# Patient Record
Sex: Male | Born: 1945 | Race: White | Hispanic: No | Marital: Married | Smoking: Former smoker
Health system: Southern US, Community
[De-identification: ages and names within clinical notes are randomized; demographics above are authoritative.]

## PROBLEM LIST (undated history)

## (undated) DIAGNOSIS — I2699 Other pulmonary embolism without acute cor pulmonale: Secondary | ICD-10-CM

## (undated) DIAGNOSIS — C3491 Malignant neoplasm of unspecified part of right bronchus or lung: Secondary | ICD-10-CM

## (undated) DIAGNOSIS — C7951 Secondary malignant neoplasm of bone: Secondary | ICD-10-CM

## (undated) DIAGNOSIS — Z974 Presence of external hearing-aid: Secondary | ICD-10-CM

## (undated) DIAGNOSIS — J449 Chronic obstructive pulmonary disease, unspecified: Secondary | ICD-10-CM

## (undated) DIAGNOSIS — M1990 Unspecified osteoarthritis, unspecified site: Secondary | ICD-10-CM

## (undated) DIAGNOSIS — I82409 Acute embolism and thrombosis of unspecified deep veins of unspecified lower extremity: Secondary | ICD-10-CM

## (undated) DIAGNOSIS — H9191 Unspecified hearing loss, right ear: Secondary | ICD-10-CM

## (undated) DIAGNOSIS — Z87442 Personal history of urinary calculi: Secondary | ICD-10-CM

## (undated) DIAGNOSIS — C61 Malignant neoplasm of prostate: Secondary | ICD-10-CM

## (undated) DIAGNOSIS — H3530 Unspecified macular degeneration: Secondary | ICD-10-CM

## (undated) HISTORY — PX: SHOULDER SURGERY: SHX246

## (undated) HISTORY — DX: Secondary malignant neoplasm of bone: C79.51

## (undated) HISTORY — PX: OTHER SURGICAL HISTORY: SHX169

---

## 1991-02-13 HISTORY — PX: KNEE ARTHROSCOPY: SUR90

## 2000-07-01 ENCOUNTER — Emergency Department (HOSPITAL_COMMUNITY): Admission: EM | Admit: 2000-07-01 | Discharge: 2000-07-01 | Payer: Self-pay | Admitting: Emergency Medicine

## 2000-07-01 ENCOUNTER — Encounter: Payer: Self-pay | Admitting: Emergency Medicine

## 2000-07-05 ENCOUNTER — Emergency Department (HOSPITAL_COMMUNITY): Admission: EM | Admit: 2000-07-05 | Discharge: 2000-07-05 | Payer: Self-pay | Admitting: Emergency Medicine

## 2002-05-22 ENCOUNTER — Emergency Department (HOSPITAL_COMMUNITY): Admission: EM | Admit: 2002-05-22 | Discharge: 2002-05-23 | Payer: Self-pay | Admitting: *Deleted

## 2002-05-23 ENCOUNTER — Encounter: Payer: Self-pay | Admitting: *Deleted

## 2006-02-27 ENCOUNTER — Emergency Department (HOSPITAL_COMMUNITY): Admission: EM | Admit: 2006-02-27 | Discharge: 2006-02-27 | Payer: Self-pay | Admitting: Emergency Medicine

## 2006-09-18 ENCOUNTER — Encounter (INDEPENDENT_AMBULATORY_CARE_PROVIDER_SITE_OTHER): Payer: Self-pay | Admitting: General Surgery

## 2006-09-18 ENCOUNTER — Inpatient Hospital Stay (HOSPITAL_COMMUNITY): Admission: EM | Admit: 2006-09-18 | Discharge: 2006-09-23 | Payer: Self-pay | Admitting: Emergency Medicine

## 2006-10-29 ENCOUNTER — Ambulatory Visit (HOSPITAL_COMMUNITY): Admission: RE | Admit: 2006-10-29 | Discharge: 2006-10-29 | Payer: Self-pay | Admitting: Family Medicine

## 2007-10-17 ENCOUNTER — Encounter (INDEPENDENT_AMBULATORY_CARE_PROVIDER_SITE_OTHER): Payer: Self-pay | Admitting: Urology

## 2007-10-24 DIAGNOSIS — C61 Malignant neoplasm of prostate: Secondary | ICD-10-CM | POA: Insufficient documentation

## 2007-10-24 HISTORY — DX: Malignant neoplasm of prostate: C61

## 2007-10-29 ENCOUNTER — Encounter (HOSPITAL_COMMUNITY): Admission: RE | Admit: 2007-10-29 | Discharge: 2007-11-10 | Payer: Self-pay | Admitting: Urology

## 2007-11-21 ENCOUNTER — Ambulatory Visit (HOSPITAL_COMMUNITY): Admission: RE | Admit: 2007-11-21 | Discharge: 2007-11-21 | Payer: Self-pay | Admitting: Radiation Oncology

## 2007-12-18 ENCOUNTER — Ambulatory Visit: Admission: RE | Admit: 2007-12-18 | Discharge: 2008-02-10 | Payer: Self-pay | Admitting: Radiation Oncology

## 2008-02-13 HISTORY — PX: APPENDECTOMY: SHX54

## 2008-02-13 HISTORY — PX: CYSTOSCOPY: SUR368

## 2008-06-06 ENCOUNTER — Emergency Department (HOSPITAL_COMMUNITY): Admission: EM | Admit: 2008-06-06 | Discharge: 2008-06-06 | Payer: Self-pay | Admitting: Emergency Medicine

## 2008-07-26 ENCOUNTER — Ambulatory Visit (HOSPITAL_COMMUNITY): Admission: RE | Admit: 2008-07-26 | Discharge: 2008-07-26 | Payer: Self-pay | Admitting: Family Medicine

## 2008-09-03 ENCOUNTER — Ambulatory Visit (HOSPITAL_COMMUNITY): Admission: RE | Admit: 2008-09-03 | Discharge: 2008-09-03 | Payer: Self-pay | Admitting: Urology

## 2008-11-01 ENCOUNTER — Ambulatory Visit (HOSPITAL_COMMUNITY): Admission: RE | Admit: 2008-11-01 | Discharge: 2008-11-01 | Payer: Self-pay | Admitting: Urology

## 2010-05-19 LAB — CBC
MCHC: 34.3 g/dL (ref 30.0–36.0)
MCV: 93.1 fL (ref 78.0–100.0)
Platelets: 187 10*3/uL (ref 150–400)
WBC: 5.2 10*3/uL (ref 4.0–10.5)

## 2010-05-19 LAB — BASIC METABOLIC PANEL
BUN: 16 mg/dL (ref 6–23)
CO2: 27 mEq/L (ref 19–32)
Calcium: 9.3 mg/dL (ref 8.4–10.5)
Chloride: 107 mEq/L (ref 96–112)
Creatinine, Ser: 0.84 mg/dL (ref 0.4–1.5)

## 2010-05-24 LAB — POCT CARDIAC MARKERS
CKMB, poc: <1 ng/mL — ABNORMAL LOW (ref 1.0–8.0)
Troponin i, poc: <0.05 ng/mL (ref 0.00–0.09)
Troponin i, poc: <0.05 ng/mL (ref 0.00–0.09)

## 2010-05-24 LAB — DIFFERENTIAL
Basophils Absolute: 0 10*3/uL (ref 0.0–0.1)
Basophils Relative: 0 % (ref 0–1)
Eosinophils Absolute: 0.2 10*3/uL (ref 0.0–0.7)
Eosinophils Relative: 3 % (ref 0–5)
Lymphocytes Relative: 11 % — ABNORMAL LOW (ref 12–46)
Monocytes Absolute: 0.4 10*3/uL (ref 0.1–1.0)

## 2010-05-24 LAB — COMPREHENSIVE METABOLIC PANEL
ALT: 24 U/L (ref 0–53)
AST: 22 U/L (ref 0–37)
Albumin: 3.5 g/dL (ref 3.5–5.2)
Alkaline Phosphatase: 91 U/L (ref 39–117)
Chloride: 109 mEq/L (ref 96–112)
GFR calc Af Amer: >60 mL/min (ref >60)
Potassium: 4.2 mEq/L (ref 3.5–5.1)
Total Bilirubin: 0.5 mg/dL (ref 0.3–1.2)

## 2010-05-24 LAB — CBC
Platelets: 203 10*3/uL (ref 150–400)
RBC: 4.78 MIL/uL (ref 4.22–5.81)
WBC: 8 10*3/uL (ref 4.0–10.5)

## 2010-06-07 ENCOUNTER — Other Ambulatory Visit: Payer: Self-pay | Admitting: Gastroenterology

## 2010-06-27 NOTE — Group Therapy Note (Signed)
NAME:  BAINE, DECESARE                 ACCOUNT NO.:  [id number]   MEDICAL RECORD NO.:  [id number]          PATIENT TYPE:  INP   LOCATION:  A313                          FACILITY:  [facility]   PHYSICIAN:  Edward L. Juanetta Gosling, M.D.DATE OF BIRTH:  [birth year]   DATE OF PROCEDURE:  DATE OF DISCHARGE:                                 PROGRESS NOTE   Patient of Dr. Malvin Johns.  Mr. Knierim seems to be doing better.  He has no  new complaints except has got hiccups.  He has been up and moving.  He  has no new problems.   His temperature is 101.2, pulse 49, respirations 20, blood pressure  134/77, O2 sats 95%, 92% earlier on room air.  He is wearing 2L now.  His chest shows minimal rhonchi.  HEART:  Regular.  ABDOMEN:  Soft.   Sputum does not show any specifics as yet.   ASSESSMENT:  He has pneumonia but he seems to be improving.  He has had  an appendectomy.  He now has hiccups, and we are working on that.  He  now has hiccups and, Dr. Malvin Johns of course will address that.  I have  asked him to try to stop smoking now because I think, if he can it will  improve his overall situation.      Edward L. Juanetta Gosling, M.D.  Electronically Signed     ELH/MEDQ  D:  09/20/2006  T:  09/20/2006  Job:  [id number]

## 2010-06-27 NOTE — Consult Note (Signed)
NAME:  Jonathan Goodwin, Jonathan Goodwin                 ACCOUNT NO.:  [id number]   MEDICAL RECORD NO.:  [id number]          PATIENT TYPE:  INP   LOCATION:  A313                          FACILITY:  [facility]   PHYSICIAN:  Edward L. Juanetta Gosling, M.D.DATE OF BIRTH:  Apr 09, 1945   DATE OF CONSULTATION:  09/18/2006  DATE OF DISCHARGE:                                 CONSULTATION   Patient of Dr. Malvin Johns.   REASON FOR CONSULTATION:  Pneumonia.   HISTORY:  Jonathan Goodwin is a 65 year old who came to the emergency room with  abdominal discomfort.  He had been diagnosed with pneumonia about 2 days  prior.  When he was in the emergency room he was noted to have  appendicitis, and he has been treated by Dr. Malvin Johns with an  appendectomy.  He says his chest is feeling really well but he is having  some trouble with trying to cough.  He says that his chest feels back to  normal.  He does have a very long smoking history but he says has  stopped about 2 days ago.  He has history of kidney stones in the past.  He has a history of pneumonia in the past.   SOCIAL HISTORY:  He he does not drink any alcohol, does not use any  illicit drugs.  He does smoke, as mentioned.   He does not have any drug allergies.   Medications at home have been:  1. Doxycycline 100 mg b.i.d.  2. Ventolin inhaler two puffs four times a day as needed.  3. Hydrocodone and acetaminophen as needed.   PHYSICAL EXAMINATION:  A well-developed, well-nourished, comfortable-  appearing male who is in no acute distress.  His temperature has been as high as 100.1, now 100, pulse in the 40s to  50s, respirations 16, blood pressure 103//63 O2 saturations 93% on room  air.  He is able to do 2000 on his incentive spirometry.  His pupils are reactive.  Mucous membranes slightly dry.  His chest is actually fairly clear.  His heart is regular without gallop.  His extremities showed no edema.  I did not examine his abdomen since he has just had surgery.   His white  blood count was 22,000 on admission, is down to 15,500.  Renal  function normal.  Chest x-ray actually probably a bit more consistent  with atelectasis than pneumonia, but with the high white blood count he  certainly could have pneumonia although it also could, of course, be  related to his problems with his appendicitis.   My plan, then:  He is already on Cipro.  I would probably add a  cephalosporin, which I think he can probably take by mouth as soon as he  is able to take any food, and he needs to finish up about 10 days of  antibiotics.      Edward L. Juanetta Gosling, M.D.  Electronically Signed     ELH/MEDQ  D:  09/19/2006  T:  09/19/2006  Job:  [id number]

## 2010-06-27 NOTE — H&P (Signed)
NAME:  Jonathan Goodwin, Jonathan Goodwin NO.:  [id number]   MEDICAL RECORD NO.:  [id number]          PATIENT TYPE:  INP   LOCATION:  A313                          FACILITY:  [facility]   PHYSICIAN:  Barbaraann Barthel, M.D. DATE OF BIRTH:  Mar 07, 1945   DATE OF ADMISSION:  09/18/2006  DATE OF DISCHARGE:  LH                              HISTORY & PHYSICAL   Surgery was asked to see this 65 year old white male for appendicitis.  He was seen in the emergency room after coming with complaints of right  lower quadrant pain, nausea and vomiting.  CT scan revealed that he had  acute nonperforated appendicitis.  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  He did not, however, go to  see any physician until yesterday at which time chest x-rays were done  at the doctor's office and he was told that he had pneumonia.  He was  given tetracycline which he took but he threw up because he later  developed the GI symptoms of nausea and vomiting associated with acute  appendicitis, so he came to the emergency room with those findings.  We  obtained a chest x-ray and he does have some left lower lobe atelectasis  and left lower lobe pneumonia present.  The rest of physical examination  discloses a pleasant 65 year old male in no acute distress.  He is 6  feet 1 inch tall, weighs 194 pounds.  His blood pressure is 99/63, pulse  rate 56, respirations 20.  His temperature is 99.9.   PHYSICAL EXAMINATION:  HEAD:  Normocephalic.  EYES:  Extraocular movements are intact.  Pupils are equal and react to  light and accommodation.  He has a history of macular degeneration.  No bruits are auscultated.  No  cervical adenopathy is appreciated.  CHEST:  The patient has some scattered rhonchi, worse on the left side.  HEART:  Regular rhythm.  ABDOMEN:  Bowel sounds are somewhat diminished.  The patient has pain  with guarding in the right lower quadrant.  No hernias are appreciated.  RECTAL:  The prostate is smooth and the stool is guaiac-negative.  EXTREMITIES:  The patient has had a left shoulder surgery in the past  some 30 years ago and he had right knee arthroscopic surgery in 1996.   REVIEW OF SYSTEMS:  GU SYSTEM:  The patient has no dysuria.  He has had  a previous history of kidney stones.  CARDIORESPIRATORY SYSTEM:  The  patient has this history of current pneumonia.  He smokes one to two  packs of cigarettes per day.  No past history of myocardial infarction.  No cardiac-like chest pains.  EKG was a normal cardiogram with sinus  bradycardia.  ENDOCRINE SYSTEM:  No history of diabetes or  thyroid  disease.  GI SYSTEM:  The patient has no history of hepatitis.  He has a  history of ulcerative colitis.  His last flare up was approximately 8  years ago in 2000.  No recent history of diarrhea or bloody stools or  mucoid diarrhea.  As stated, guaiac of his stool in the emergency room  was negative.   LABORATORY DATA:  The patient's white count is 22.7 with an H&H of 16.5  and 48.4, 92% neutrophils are noted.  His electrolytes are grossly  within normal limits.  Liver function studies are not elevated.  His  lipase and amylase are within normal limits.  His urine is grossly  within normal limits.  His blood sugar is 139, his BUN is 14, his  creatinine is 1.29, and his potassium is 4.2.  His specific gravity is  1.020.  Since this time in the emergency room he has been well hydrated  and I suspect he was somewhat dehydrated when he came in here with his  symptoms of nausea and vomiting.   IMPRESSION:  The patient has acute appendicitis by CT scan and  clinically he also presents  concomitantly with left lower lobe  pneumonia.  We will treat him with antibiotics and I will get a medical  consult.  This patient will be taken to surgery for acute appendicitis  this evening.  His dehydration has been attended to here in the  emergency room and we will follow him closely.      Barbaraann Barthel, M.D.  Electronically Signed     WB/MEDQ  D:  09/18/2006  T:  09/19/2006  Job:  [id number]   cc:   Hospitalist Team

## 2010-06-27 NOTE — Op Note (Signed)
Jonathan Goodwin, BOSKET                 ACCOUNT NO.:  [id number]   MEDICAL RECORD NO.:  [id number]          PATIENT TYPE:  INP   LOCATION:  A313                          FACILITY:  [facility]   PHYSICIAN:  Barbaraann Barthel, M.D. DATE OF BIRTH:  1945-05-21   DATE OF PROCEDURE:  09/18/2006  DATE OF DISCHARGE:                               OPERATIVE REPORT   PREOPERATIVE DIAGNOSIS:  Acute appendicitis.   POSTOPERATIVE DIAGNOSIS:  Acute suppurative, perforated appendix.   PROCEDURE:  Open appendectomy.   NOTE:  This is a 65 year old white male who presented with, according to  him, a 24-hour history of right lower quadrant pain accompanied with  nausea and vomiting.  He also presented concomitantly with left lower  lobe pneumonia, which was diagnosed again 24 hours previously.  He came  to the emergency room.  A CT scan was performed and revealed acute  appendicitis.  On questioning him I was impressed with the history and  the physical examination for pneumonia as well, as well as the elevated  white count with a left shift, and we planned to take him to the  operating room.  We discussed the procedure with him in detail,  discussing complications not limited to but including bleeding,  infection and leaking from appendiceal stump, and we also discussed the  possibility that a drain may need to be placed if this was perforated or  an abscessed appendix.  Informed consent was obtained.   GROSS OPERATIVE FINDINGS:  Those consistent with a perforated, necrotic,  gangrenous appendix.  The rest of the right lower quadrant terminal  ileum was normal.   SPECIMEN:  Appendix.   TECHNIQUE:  The patient was placed in a supine position.  After the  adequate administration of general anesthesia via endotracheal  intubation, a Foley catheter was inserted.  The patient was prepped with  Betadine solution and draped in the usual manner.  A transverse incision  was carried out in the right lower quadrant  through skin and  subcutaneous tissue down through the rectus muscle sheath.  The rectus  muscle was partly divided and retracted medially.  The posterior sheath  and peritoneum was then grasped and opened carefully and the abdomen was  entered and explored with the above findings.  I ligated the  mesoappendix with 2-0 silk and there was obvious perforation here.  We  were able to maintain minimal contamination.  Then after ligating the  mesoappendix I amputated it with a TA-30 stapler and inverted the stump.  We then changed gloves, irrigated the right lower quadrant with copious  amounts of normal saline solution, and I elected to leave a Al Pimple drain through a separate stab wound incision in the right lower  quadrant.  Then after changing gloves and irrigating, we closed the  fascia with interrupted figure-of-eight 0 Vicryl sutures.  We used with  approximately 10 mL of 0.5% Sensorcaine to help with postoperative  comfort.  Then we irrigated the subcu and partially closed the wound and  packed it open and left sutures to close at a later date  in a delayed  fashion as this patient is a high risk for wound infection.  I used 4-0  nylon sutures, which were left in place to be tied later.  I packed the  wound open with iodoform gauze soaked in saline solution and then placed  a sterile dressing over this.  Prior to closure all sponge, needle and  instrument counts were  found to be correct.  Estimated blood loss was minimal.  The patient  received a liter of crystalloids intraoperatively.  There were no  complications.  Operating time was approximately an hour, and the  patient was taken to the recovery room in satisfactory condition.      Barbaraann Barthel, M.D.  Electronically Signed     WB/MEDQ  D:  09/18/2006  T:  09/19/2006  Job:  [id number]   cc:   Ramon Dredge L. Juanetta Gosling, M.D.  Fax: [phone]   Rhae Lerner. Margretta Ditty, M.D.  [address]  [place]  [place] [address]

## 2010-06-27 NOTE — Discharge Summary (Signed)
Jonathan Goodwin, Jonathan Goodwin                 ACCOUNT NO.:  [id number]   MEDICAL RECORD NO.:  [id number]          PATIENT TYPE:  INP   LOCATION:  A313                          FACILITY:  [facility]   PHYSICIAN:  Barbaraann Barthel, M.D. DATE OF BIRTH:  [birth year]   DATE OF ADMISSION:  09/18/2006  DATE OF DISCHARGE:  LH                               DISCHARGE SUMMARY   DIAGNOSES:  1. Acute appendicitis (with perforation).  2. Left lower lobe pneumonia.  3. Bradycardia.   CONSULTATIONS:  Dr. Juanetta Gosling of the respiratory service and Dr. Domingo Sep  of [facility].   HISTORY OF PRESENT ILLNESS:  This is a 65 year old white male who came  to the emergency room and had acute appendicitis.  Intraoperatively he  was found to have acute perforated appendicitis.  He also presented with  concomitant pneumonia. Postoperatively he did very well.  He was seen by  Dr. Juanetta Gosling regarding his pneumonia and we treated him with Cipro as  well as Rocephin while in the hospital setting.  He defervesced and his  white count went from 20,000 down to 12,000 and he clinically improved.  His wound was packed open for a delayed closure to avoid an infection in  this patient with a perforated appendix and we removed the packing on  the third postoperative day and irrigated it and closed the wound by the  bedside. On the fourth postoperative day his wounds looked good.  He  remained afebrile and his wound was clean without any signs of  infection.  He was moving his bowels and urinating and doing well and  tolerating p.o. without problem.  He was noted to have bradycardia in  the in the 40s and the 50s while he was here and I wanted him to see Dr.  Margo Aye who is his regular doctor; however, Dr. Margo Aye was out of town. His  wife sees [place] cardiology and I  therefore consulted them to see  him prior to his discharge and their recommendations are pending but  will be noted prior to his discharge.   As stated his  hospital course was completely uneventful.  He did well.  Pathology did reveal acute supportive perforated appendicitis.  His  white count on September 21, 2006 was 12.1 with an H&H of 14.4 and 42.9.  This is down from a 22,000 white count at admission. His electrolytes  were grossly within normal limits. His liver function studies were also  grossly within normal limits. His sputum cultures showed rare white  blood cells, a few squamous cell, epithelial cells a few gram positive  cocci were noted. His urinalysis showed 15,000 colonies not significant  enterococcus species.  His chest x-ray did show improved basilar  aeration on follow-up chest x-rays from his initial findings of  pneumonia.  CT scan did reveal appendicitis and their impression was  that there was not perforation; however, intraoperatively there indeed  was a walled off perforation of the appendix.   DISCHARGE INSTRUCTIONS:  He is excused from work and given a note to  that effect for his employer. He is  discharged on a soft diet and a full  liquid diet.  He is told to clean his wound with alcohol three times  daily or more so if he becomes sweaty etc. He is told to increase his  activity as tolerated.  He is permitted to go indoors and outdoors and  up and down the stairs.  He is permitted to shower in 2 days.  He is to  keep his wound clean with alcohol. He is to avoid swimming pools,  Jacuzzis and baths until I tell him to do so. No heavy lifting, no  driving, no sexual activity at that time until instructed.  We will  follow up with him in the office on Thursday, September 26, 2006 at 2  o'clock p.m. He is told to come to the emergency room or contact me  should he have any acute changes.  He is given a prescription for Cipro  500 mg to take every 12 hours with the next dose being at 10 o'clock  this evening and to continue these for the next 5 days.  He is to take  Colace 1 tablet daily or every other day as needed and Milk  of Magnesia  also as needed.  He is told to stay away from any harsh laxatives  however.  Darvocet-N 100 one tablet every 4 hours is another  prescription that is given to him should he need that. As far as his  cardiac meds go, these will be prescribed by Dr. Domingo Sep prior to his  departure and discharge.      Barbaraann Barthel, M.D.  Electronically Signed     WB/MEDQ  D:  09/23/2006  T:  09/24/2006  Job:  [id number]   cc:   Catalina Pizza, M.D.  Fax: [phone]   Dani Gobble, MD  Fax: [phone]   Oneal Deputy. Juanetta Gosling, M.D.  Fax: [phone]

## 2010-06-27 NOTE — Group Therapy Note (Signed)
NAME:  LOGIN, MUCKLEROY                 ACCOUNT NO.:  [id number]   MEDICAL RECORD NO.:  [id number]          PATIENT TYPE:  INP   LOCATION:  A313                          FACILITY:  [facility]   PHYSICIAN:  Edward L. Juanetta Gosling, M.D.DATE OF BIRTH:  28-Jul-1945   DATE OF PROCEDURE:  DATE OF DISCHARGE:                                 PROGRESS NOTE   Patient of Dr. Malvin Johns.  Mr. Mannan continues to recover from his  appendectomy.  He has still not had any bowel movement, but he feels  pretty well.  He is not very short of breath.  He is coughing.  He is  coughing up some sputum.   EXAMINATION:  Shows temperature is 98.7, pulse 50, respirations 22,  blood pressure 160/94, O2 sats 94%.  His chest is clearing.  His heart is regular.   ASSESSMENT:  I think he is better but clearly still has problems.  He is  going to need to have something for his blood pressure, probably, but I  agree that since he is still not having bowel movements we can hold off  on that.  I do not plan to change anything else. Hopefully he will  resume bowel function and be able to be discharged fairly soon.      Edward L. Juanetta Gosling, M.D.  Electronically Signed     ELH/MEDQ  D:  09/21/2006  T:  09/21/2006  Job:  [id number]

## 2010-11-27 LAB — URINE MICROSCOPIC-ADD ON

## 2010-11-27 LAB — CBC
HCT: 41.5
HCT: 42.9
HCT: 48.4
Hemoglobin: 14.4
MCHC: 33.6
MCV: 90.4
Platelets: 191
Platelets: 216
RBC: 4.6
RDW: 14.3 — ABNORMAL HIGH
RDW: 14.4 — ABNORMAL HIGH
WBC: 15.5 — ABNORMAL HIGH
WBC: 7.6

## 2010-11-27 LAB — DIFFERENTIAL
Basophils Absolute: 0
Basophils Absolute: 0
Basophils Relative: 0
Basophils Relative: 0
Eosinophils Absolute: 0
Eosinophils Absolute: 0
Eosinophils Absolute: 0.1
Eosinophils Absolute: 0.3
Eosinophils Relative: 0
Eosinophils Relative: 1
Lymphocytes Relative: 21
Lymphs Abs: 0.9
Lymphs Abs: 1.1
Lymphs Abs: 1.6
Monocytes Absolute: 0.7
Monocytes Relative: 4
Monocytes Relative: 5
Neutro Abs: 10.7 — ABNORMAL HIGH
Neutrophils Relative %: 65
Neutrophils Relative %: 88 — ABNORMAL HIGH

## 2010-11-27 LAB — BASIC METABOLIC PANEL
BUN: 6
CO2: 26
Calcium: 7.8 — ABNORMAL LOW
Chloride: 105
Creatinine, Ser: 1.01
Creatinine, Ser: 1.14
GFR calc Af Amer: >60
GFR calc non Af Amer: >60
Glucose, Bld: 111 — ABNORMAL HIGH
Potassium: 4.5
Sodium: 136
Sodium: 137

## 2010-11-27 LAB — AMYLASE: Amylase: 35

## 2010-11-27 LAB — URINALYSIS, ROUTINE W REFLEX MICROSCOPIC
Bilirubin Urine: NEGATIVE
Glucose, UA: NEGATIVE
Ketones, ur: NEGATIVE
pH: 6

## 2010-11-27 LAB — URINE CULTURE: Colony Count: 15000

## 2010-11-27 LAB — COMPREHENSIVE METABOLIC PANEL
Albumin: 3.6
Alkaline Phosphatase: 61
BUN: 14
Creatinine, Ser: 1.29
Potassium: 4.2
Total Protein: 6.8

## 2010-11-27 LAB — CULTURE, RESPIRATORY W GRAM STAIN

## 2010-11-27 LAB — LIPASE, BLOOD: Lipase: 12

## 2010-12-12 ENCOUNTER — Other Ambulatory Visit (HOSPITAL_COMMUNITY): Payer: Self-pay | Admitting: Family Medicine

## 2010-12-12 DIAGNOSIS — N63 Unspecified lump in unspecified breast: Secondary | ICD-10-CM

## 2011-01-03 ENCOUNTER — Other Ambulatory Visit (HOSPITAL_COMMUNITY): Payer: Self-pay | Admitting: Family Medicine

## 2011-01-03 ENCOUNTER — Ambulatory Visit (HOSPITAL_COMMUNITY)
Admission: RE | Admit: 2011-01-03 | Discharge: 2011-01-03 | Disposition: A | Discharge status: Home / Self Care | Payer: Medicare Other | Source: Ambulatory Visit | Attending: Family Medicine | Admitting: Family Medicine

## 2011-01-03 DIAGNOSIS — N644 Mastodynia: Secondary | ICD-10-CM | POA: Insufficient documentation

## 2011-01-03 DIAGNOSIS — N63 Unspecified lump in unspecified breast: Secondary | ICD-10-CM

## 2011-06-08 NOTE — Patient Instructions (Addendum)
20 Jonathan Goodwin  06/08/2011   Your procedure is scheduled on:   06/12/2011  Report to [facility] at  950  AM.  Call this number if you have problems the morning of surgery: [phone]   Remember:   Do not eat food:After Midnight.  May have clear liquids:until Midnight .  Clear liquids include soda, tea, black coffee, apple or grape juice, broth.  Take these medicines the morning of surgery with A SIP OF WATER: none   Do not wear jewelry, make-up or nail polish.  Do not wear lotions, powders, or perfumes. You may wear deodorant.  Do not shave 48 hours prior to surgery.  Do not bring valuables to the hospital.  Contacts, dentures or bridgework may not be worn into surgery.  Leave suitcase in the car. After surgery it may be brought to your room.  For patients admitted to the hospital, checkout time is 11:00 AM the day of discharge.   Patients discharged the day of surgery will not be allowed to drive home.  Name and phone number of your driver: family  Special Instructions: CHG Shower Use Special Wash: 1/2 bottle night before surgery and 1/2 bottle morning of surgery.   Please read over the following fact sheets that you were given: Pain Booklet, MRSA Information, Surgical Site Infection Prevention, Anesthesia Post-op Instructions and Care and Recovery After Surgery Cystoscopy (Bladder Exam) Care After Refer to this sheet in the next few weeks. These discharge instructions provide you with general information on caring for yourself after you leave the hospital. Your caregiver may also give you specific instructions. Your treatment has been planned according to the most current medical practices available, but unavoidable complications sometimes occur. If you have any problems or questions after discharge, please call your caregiver. AFTER THE PROCEDURE   There may be temporary bleeding and burning with urination.   Drink enough water and fluids to keep your urine clear or pale yellow.    FINDING OUT THE RESULTS OF YOUR TEST Not all test results are available during your visit. If your test results are not back during the visit, make an appointment with your caregiver to find out the results. Do not assume everything is normal if you have not heard from your caregiver or the medical facility. It is important for you to follow up on all of your test results. SEEK IMMEDIATE MEDICAL CARE IF:   There is an increase in blood in the urine or you are passing clots.   There is difficulty passing urine.   You develop the chills.   You have an oral temperature above 102 F (38.9 C), not controlled by medicine.   Belly (abdominal) pain develops.  Document Released: 08/18/2004 Document Revised: 01/18/2011 Document Reviewed: 06/16/2007 [facility] Patient Information 2012 [place], [place].PATIENT INSTRUCTIONS POST-ANESTHESIA  IMMEDIATELY FOLLOWING SURGERY:  Do not drive or operate machinery for the first twenty four hours after surgery.  Do not make any important decisions for twenty four hours after surgery or while taking narcotic pain medications or sedatives.  If you develop intractable nausea and vomiting or a severe headache please notify your doctor immediately.  FOLLOW-UP:  Please make an appointment with your surgeon as instructed. You do not need to follow up with anesthesia unless specifically instructed to do so.  WOUND CARE INSTRUCTIONS (if applicable):  Keep a dry clean dressing on the anesthesia/puncture wound site if there is drainage.  Once the wound has quit draining you may leave it open to  air.  Generally you should leave the bandage intact for twenty four hours unless there is drainage.  If the epidural site drains for more than 36-48 hours please call the anesthesia department.  QUESTIONS?:  Please feel free to call your physician or the hospital operator if you have any questions, and they will be happy to assist you.

## 2011-06-11 ENCOUNTER — Encounter (HOSPITAL_COMMUNITY): Payer: Self-pay | Admitting: Pharmacy Technician

## 2011-06-11 ENCOUNTER — Encounter (HOSPITAL_COMMUNITY)
Admission: RE | Admit: 2011-06-11 | Discharge: 2011-06-11 | Disposition: A | Discharge status: Home / Self Care | Payer: Medicare Other | Source: Ambulatory Visit | Attending: Urology | Admitting: Urology

## 2011-06-11 ENCOUNTER — Other Ambulatory Visit: Payer: Self-pay

## 2011-06-11 ENCOUNTER — Encounter (HOSPITAL_COMMUNITY): Payer: Self-pay

## 2011-06-11 HISTORY — DX: Unspecified macular degeneration: H35.30

## 2011-06-11 HISTORY — DX: Unspecified hearing loss, right ear: H91.91

## 2011-06-11 HISTORY — DX: Presence of external hearing-aid: Z97.4

## 2011-06-11 HISTORY — DX: Personal history of urinary calculi: Z87.442

## 2011-06-11 HISTORY — DX: Unspecified osteoarthritis, unspecified site: M19.90

## 2011-06-11 HISTORY — DX: Malignant neoplasm of prostate: C61

## 2011-06-11 LAB — SURGICAL PCR SCREEN
MRSA, PCR: POSITIVE — AB
Staphylococcus aureus: POSITIVE — AB

## 2011-06-11 LAB — BASIC METABOLIC PANEL
CO2: 28 mEq/L (ref 19–32)
Calcium: 9.7 mg/dL (ref 8.4–10.5)
Chloride: 104 mEq/L (ref 96–112)
Glucose, Bld: 90 mg/dL (ref 70–99)
Potassium: 5.3 mEq/L — ABNORMAL HIGH (ref 3.5–5.1)
Sodium: 139 mEq/L (ref 135–145)

## 2011-06-11 LAB — DIFFERENTIAL
Eosinophils Relative: 2 % (ref 0–5)
Lymphocytes Relative: 19 % (ref 12–46)
Lymphs Abs: 1.4 10*3/uL (ref 0.7–4.0)
Monocytes Relative: 8 % (ref 3–12)

## 2011-06-11 LAB — CBC
Hemoglobin: 16.9 g/dL (ref 13.0–17.0)
MCV: 93.8 fL (ref 78.0–100.0)
Platelets: 182 10*3/uL (ref 150–400)
RBC: 5.47 MIL/uL (ref 4.22–5.81)
WBC: 7.5 10*3/uL (ref 4.0–10.5)

## 2011-06-12 ENCOUNTER — Encounter (HOSPITAL_COMMUNITY)
Admission: RE | Disposition: A | Discharge status: Home / Self Care | Payer: Self-pay | Source: Ambulatory Visit | Attending: Urology

## 2011-06-12 ENCOUNTER — Ambulatory Visit (HOSPITAL_COMMUNITY): Payer: Medicare Other

## 2011-06-12 ENCOUNTER — Encounter (HOSPITAL_COMMUNITY): Payer: Self-pay | Admitting: Anesthesiology

## 2011-06-12 ENCOUNTER — Ambulatory Visit (HOSPITAL_COMMUNITY)
Admission: RE | Admit: 2011-06-12 | Discharge: 2011-06-12 | Disposition: A | Discharge status: Home / Self Care | Payer: Medicare Other | Source: Ambulatory Visit | Attending: Urology | Admitting: Urology

## 2011-06-12 ENCOUNTER — Ambulatory Visit (HOSPITAL_COMMUNITY): Payer: Medicare Other | Admitting: Anesthesiology

## 2011-06-12 DIAGNOSIS — J4489 Other specified chronic obstructive pulmonary disease: Secondary | ICD-10-CM | POA: Insufficient documentation

## 2011-06-12 DIAGNOSIS — J449 Chronic obstructive pulmonary disease, unspecified: Secondary | ICD-10-CM | POA: Insufficient documentation

## 2011-06-12 DIAGNOSIS — Z01812 Encounter for preprocedural laboratory examination: Secondary | ICD-10-CM | POA: Insufficient documentation

## 2011-06-12 DIAGNOSIS — N35919 Unspecified urethral stricture, male, unspecified site: Secondary | ICD-10-CM | POA: Insufficient documentation

## 2011-06-12 DIAGNOSIS — Z8546 Personal history of malignant neoplasm of prostate: Secondary | ICD-10-CM | POA: Insufficient documentation

## 2011-06-12 DIAGNOSIS — Z01810 Encounter for preprocedural cardiovascular examination: Secondary | ICD-10-CM | POA: Insufficient documentation

## 2011-06-12 HISTORY — PX: URETHROTOMY: SHX1083

## 2011-06-12 LAB — POCT I-STAT 4, (NA,K, GLUC, HGB,HCT)
HCT: 55 % — ABNORMAL HIGH (ref 39.0–52.0)
Hemoglobin: 18.7 g/dL — ABNORMAL HIGH (ref 13.0–17.0)
Potassium: 4.5 mEq/L (ref 3.5–5.1)
Sodium: 142 mEq/L (ref 135–145)

## 2011-06-12 SURGERY — CYSTOSCOPY, WITH URETHROTOMY
Anesthesia: General | Wound class: Clean Contaminated

## 2011-06-12 MED ORDER — MIDAZOLAM HCL 2 MG/2ML IJ SOLN
INTRAMUSCULAR | Status: AC
  Administered 2011-06-12: 2 mg via INTRAVENOUS
  Filled 2011-06-12: qty 2

## 2011-06-12 MED ORDER — PROPOFOL 10 MG/ML IV EMUL
INTRAVENOUS | Status: DC | PRN
  Administered 2011-06-12: 50 mg via INTRAVENOUS
  Administered 2011-06-12: 100 mg via INTRAVENOUS

## 2011-06-12 MED ORDER — MUPIROCIN 2 % EX OINT
TOPICAL_OINTMENT | CUTANEOUS | Status: AC
  Administered 2011-06-12: 2 via NASAL
  Filled 2011-06-12: qty 22

## 2011-06-12 MED ORDER — MIDAZOLAM HCL 2 MG/2ML IJ SOLN
1.0000 mg | INTRAMUSCULAR | Status: DC | PRN
  Administered 2011-06-12 (×2): 2 mg via INTRAVENOUS

## 2011-06-12 MED ORDER — FENTANYL CITRATE 0.05 MG/ML IJ SOLN
INTRAMUSCULAR | Status: DC | PRN
  Administered 2011-06-12: 25 ug via INTRAVENOUS
  Administered 2011-06-12: 50 ug via INTRAVENOUS
  Administered 2011-06-12: 25 ug via INTRAVENOUS

## 2011-06-12 MED ORDER — MUPIROCIN CALCIUM 2 % EX CREA: TOPICAL_CREAM | Freq: Every day | CUTANEOUS | Status: DC

## 2011-06-12 MED ORDER — LACTATED RINGERS IV SOLN
INTRAVENOUS | Status: DC
  Administered 2011-06-12: 1000 mL via INTRAVENOUS

## 2011-06-12 MED ORDER — MUPIROCIN 2 % EX OINT
TOPICAL_OINTMENT | Freq: Two times a day (BID) | CUTANEOUS | Status: DC
  Administered 2011-06-12: 2 via NASAL

## 2011-06-12 MED ORDER — FENTANYL CITRATE 0.05 MG/ML IJ SOLN: 25.0000 ug | INTRAMUSCULAR | Status: DC | PRN

## 2011-06-12 MED ORDER — ONDANSETRON HCL 4 MG/2ML IJ SOLN: 4.0000 mg | Freq: Once | INTRAMUSCULAR | Status: DC | PRN

## 2011-06-12 MED ORDER — SODIUM CHLORIDE 0.9 % IR SOLN
Status: DC | PRN
  Administered 2011-06-12: 3000 mL

## 2011-06-12 MED ORDER — FENTANYL CITRATE 0.05 MG/ML IJ SOLN
INTRAMUSCULAR | Status: AC
  Filled 2011-06-12: qty 2

## 2011-06-12 MED ORDER — STERILE WATER FOR IRRIGATION IR SOLN
Status: DC | PRN
  Administered 2011-06-12: 1000 mL

## 2011-06-12 SURGICAL SUPPLY — 24 items
BAG DRAIN URO TABLE W/ADPT NS (DRAPE) ×2 IMPLANT
BAG DRN 8 ADPR NS SKTRN CSTL (DRAPE) ×1
BAG URINE DRAINAGE (UROLOGICAL SUPPLIES) ×2 IMPLANT
CATH FOLEY 2W 5CC 18FR LF (CATHETERS) ×1 IMPLANT
CATH URET WHISTLE 4FR (CATHETERS) ×1 IMPLANT
CLOTH BEACON ORANGE TIMEOUT ST (SAFETY) ×2 IMPLANT
GLOVE BIO SURGEON STRL SZ7 (GLOVE) ×2 IMPLANT
GLOVE BIOGEL PI IND STRL 7.0 (GLOVE) IMPLANT
GLOVE BIOGEL PI INDICATOR 7.0 (GLOVE) ×1
GLOVE ECLIPSE 6.5 STRL STRAW (GLOVE) ×1 IMPLANT
GLOVE EXAM NITRILE MD LF STRL (GLOVE) ×1 IMPLANT
GOWN STRL REIN XL XLG (GOWN DISPOSABLE) ×2 IMPLANT
IV NS IRRIG 3000ML ARTHROMATIC (IV SOLUTION) ×2 IMPLANT
KIT ROOM TURNOVER AP CYSTO (KITS) ×2 IMPLANT
KNIFE DISP COLD (ELECTROSURGICAL) ×3 IMPLANT
MANIFOLD NEPTUNE II (INSTRUMENTS) ×2 IMPLANT
PACK CYSTO (CUSTOM PROCEDURE TRAY) ×2 IMPLANT
PAD ARMBOARD 7.5X6 YLW CONV (MISCELLANEOUS) ×2 IMPLANT
SUT SILK 0 FSL (SUTURE) IMPLANT
SUT VIC AB 2-0 CT2 27 (SUTURE) IMPLANT
SUT VICRYL 0 27 CT2 27 ABS (SUTURE) IMPLANT
SYRINGE IRR TOOMEY STRL 70CC (SYRINGE) ×3 IMPLANT
TOWEL OR 17X26 4PK STRL BLUE (TOWEL DISPOSABLE) ×2 IMPLANT
WATER STERILE IRR 1000ML POUR (IV SOLUTION) ×2 IMPLANT

## 2011-06-12 NOTE — Progress Notes (Signed)
Dr Jerre Simon over to speak with patient. Patient still not totally agreeing with dr. Donnella Sham catheter will come out and soon as he gets out of here.

## 2011-06-12 NOTE — Progress Notes (Signed)
From PACU. To postop rm 3. Awake. To recliner. Uncooperative. Non-compliant. Wants to go home. "I don't know if this catheter will be for a whole week. I will take it out myself." Informed pt, if he pulls foley out, that there is a balloon at the end of the catheter and that it will be painful and cause bleeding. Pt stated, " I can take it out." Wife at side and attempted to explain to pt about the catheter and what damage would be done if he pulled the catheter out. Pt continues to complain and be uncooperative.

## 2011-06-12 NOTE — Brief Op Note (Signed)
06/12/2011  10:01 AM  PATIENT:  Jonathan Goodwin  66 y.o. male  PRE-OPERATIVE DIAGNOSIS:  urethral stricture  POST-OPERATIVE DIAGNOSIS:  urethral stricture  PROCEDURE:  Procedure(s) (LRB): CYSTOSCOPY/URETHROTOMY (N/A) URETHROGRAM (N/A)  SURGEON:  Surgeon(s) and Role:    * Ky Barban, MD - Primary  PHYSICIAN ASSISTANT:   ASSISTANTS: none   ANESTHESIA:   spinal  EBL:  Total I/O In: 550 [I.V.:550] Out: 0   BLOOD ADMINISTERED:none  DRAINS: Urinary Catheter (Foley)   LOCAL MEDICATIONS USED:  NONE  SPECIMEN:  No Specimen  DISPOSITION OF SPECIMEN:  N/A  COUNTS:  YES  TOURNIQUET:  * No tourniquets in log *  DICTATION: .Other Dictation: Dictation Number ditation #[id number]  PLAN OF CARE: Discharge to home after PACU  PATIENT DISPOSITION:  PACU - hemodynamically stable.   Delay start of Pharmacological VTE agent (>24hrs) due to surgical blood loss or risk of bleeding:

## 2011-06-12 NOTE — Consult Note (Signed)
NAME:  Jonathan Goodwin, Jonathan Goodwin                 ACCOUNT NO.:  [id number]  MEDICAL RECORD NO.:  [id number]  LOCATION:                                FACILITY:  [facility]  PHYSICIAN:  Ky Barban, M.D.DATE OF BIRTH:  1945-06-16  DATE OF CONSULTATION:  06/11/2011 DATE OF DISCHARGE:                                CONSULTATION   HISTORY OF PRESENT ILLNESS:  This 66 year old gentleman who is known to have prostate cancer, underwent external beam radiation therapy in 2009. He also received adjuvant hormone therapy.  He has developed urethral stricture and has difficulty voiding.  He underwent optical urethrotomy by Dr. Rito Ehrlich in 2010, and now he has developed the same problem.  So, I have advised him to undergo retrograde urethrogram, optical urethrotomy under anesthesia as outpatient.  PAST MEDICAL HISTORY:  He has history of ulcerative colitis, status post arthroscopic knee surgeries, status post left shoulder surgery, status post multiple colonoscopies for ulcerative colitis, status post external beam radiation therapy for prostate cancer, and status post visual urethrotomy.  MEDICATIONS:  Flomax, Zoloft, simvastatin.  ALLERGIES:  None.  REVIEW OF SYSTEMS:  Unremarkable.  PHYSICAL EXAMINATION:  GENERAL:  Moderately built, not in acute distress, fully conscious, alert, and oriented. VITAL SIGNS:  Blood pressure 130/80, temperature is normal. CENTRAL NERVOUS SYSTEM:  No gross neurological deficit. HEAD, NECK, EYE, ENT:  Negative. CHEST:  Symmetrical. HEART:  Regular sinus rhythm.  No murmur. ABDOMEN:  Soft, flat.  Liver, spleen, and kidneys are not palpable.  No CVA tenderness. GENITOURINARY:  External genitalia is unremarkable. RECTAL:  Deferred. EXTREMITIES:  Normal.  It should be noted that his PSA on June 06, 2011 is 0.56.  IMPRESSION: 1. Recurrent urethral stricture history. 2. Prostate cancer, status post radiation therapy.  PLAN:  Retrograde urethrogram, optical  urethrotomy under anesthesia as outpatient.     Ky Barban, M.D.     MIJ/MEDQ  D:  06/11/2011  T:  06/12/2011  Job:  [id number]

## 2011-06-12 NOTE — Anesthesia Procedure Notes (Signed)
Procedure Name: LMA Insertion Date/Time: 06/12/2011 9:23 AM Performed by: Franco Nones Pre-anesthesia Checklist: Patient identified, Patient being monitored, Emergency Drugs available, Timeout performed and Suction available Patient Re-evaluated:Patient Re-evaluated prior to inductionOxygen Delivery Method: Circle System Utilized Preoxygenation: Pre-oxygenation with 100% oxygen Intubation Type: IV induction Ventilation: Mask ventilation without difficulty LMA: LMA inserted LMA Size: 4.0 Number of attempts: 1 Placement Confirmation: positive ETCO2 and breath sounds checked- equal and bilateral

## 2011-06-12 NOTE — Progress Notes (Signed)
Patient agreeing  To go home with  Catheter . Still not showing that there will be much compiance when he goes home to return to dr Jerre Simon  tues may 7th.

## 2011-06-12 NOTE — Anesthesia Preprocedure Evaluation (Signed)
Anesthesia Evaluation  Patient identified by MRN, date of birth, ID band Patient awake    Reviewed: Allergy & Precautions, H&P , NPO status , Patient's Chart, lab work & pertinent test results  History of Anesthesia Complications Negative for: history of anesthetic complications  Airway Mallampati: I  Neck ROM: Full    Dental  (+) Edentulous Upper, Partial Lower, Poor Dentition and Chipped   Pulmonary COPDCurrent Smoker,  breath sounds clear to auscultation        Cardiovascular negative cardio ROS  Rhythm:Regular Rate:Normal     Neuro/Psych    GI/Hepatic PUD,   Endo/Other    Renal/GU      Musculoskeletal   Abdominal   Peds  Hematology   Anesthesia Other Findings   Reproductive/Obstetrics                           Anesthesia Physical Anesthesia Plan  ASA: III  Anesthesia Plan: General   Post-op Pain Management:    Induction: Intravenous  Airway Management Planned: LMA  Additional Equipment:   Intra-op Plan:   Post-operative Plan: Extubation in OR  Informed Consent: I have reviewed the patients History and Physical, chart, labs and discussed the procedure including the risks, benefits and alternatives for the proposed anesthesia with the patient or authorized representative who has indicated his/her understanding and acceptance.     Plan Discussed with:   Anesthesia Plan Comments:         Anesthesia Quick Evaluation

## 2011-06-12 NOTE — Discharge Instructions (Addendum)
Cystoscopy (Bladder Exam) A cystoscopy is an examination of your urinary bladder with a cystoscope. A cystoscope is an instrument like a small telescope with strong lights and lenses. It is inserted into the bladder through the urethra (the opening into the bladder) and allows your caregiver to examine the inside of your bladder. The procedure causes little discomfort and can be done in a hospital or office. It is a diagnostic procedure to evaluate the inside of your bladder. It may involve x-rays to further evaluate the ureters or internal aspects of the kidneys. It may aid in the removal of urinary stones  or in taking tissue samples (biopsies) if necessary. The procedure is easier in females because of a shorter urethra. In a male, the procedure must be done through the penis. This often requires more sedation and more time to do the procedure. The procedure usually takes twenty minutes to one half hour for a male and approximately an hour for a male. LET YOUR CAREGIVERS KNOW ABOUT:  Allergies.   Medications taken including herbs, eye drops, over the counter medications, and creams.   Use of steroids (by mouth or creams).   Previous problems with anesthetics or novocaine.   Possibility of pregnancy, if this applies.   History of blood clots (thrombophlebitis).   History of bleeding or blood problems.   Previous surgery, especially where prosthetics have been used like hip or knee replacements, and heart valve replacements.   Other health problems.  BEFORE THE PROCEDURE  You should be present 60 minutes prior to your procedure or as directed.  PROCEDURE During the procedure, you will:  Be assisted by your urologist and a nurse.   Lie on a cystoscopy table with your knees elevated and legs apart and covered with a drape. For women this is the same position as when a pap smear is taken.   Have the urethral area or penis washed and covered with sterile towels.   Have an anesthetic  (numbing) jelly applied to the urethra. This is usually all that is required for females but males may also require sedation.   Have the cystoscope inserted through the urethra and into the bladder. Sterile fluid will flow through the cystoscope and into the bladder. This will expand the bladder and provide clear fluid for the urologist to look through and examine the interior of the bladder.   Be allowed to go home once you are doing well, are stable, and awake if you were given a sedative. If given a sedative, have someone give you a ride home.  AFTER THE PROCEDURE  You may have temporary bleeding and burning on urination.   Drink lots of fluids.  SEEK IMMEDIATE MEDICAL CARE IF:  There is an increase in blood in the urine or if you are passing clots.   You have difficulty in passing your urine   You develop chills and/or an unexplained oral temperature above 102 F (38.9 C).  Your caregiver will discuss your results with you following the procedure. This may be at a later time if you have been sedated. If other testing or biopsies were taken, ask your caregiver how you are to obtain the results. Remember it is your responsibility to get your results. Do not assume everything is normal if you do not hear from your caregiver. Document Released: 01/27/2000 Document Revised: 01/18/2011 Document Reviewed: 11/20/2007 ExitCare Patient Information 2012 ExitCare, [facility] Care After Refer to this sheet in the next few weeks. These discharge instructions provide you  with general information on caring for yourself after you leave the hospital. Your caregiver may also give you specific instructions. Your treatment has been planned according to the most current medical practices available, but unavoidable complications sometimes occur. If you have any problems or questions after discharge, please call your caregiver. AFTER THE PROCEDURE   There may be temporary bleeding and burning with urination.    Drink enough water and fluids to keep your urine clear or pale yellow.  FINDING OUT THE RESULTS OF YOUR TEST Not all test results are available during your visit. If your test results are not back during the visit, make an appointment with your caregiver to find out the results. Do not assume everything is normal if you have not heard from your caregiver or the medical facility. It is important for you to follow up on all of your test results. SEEK IMMEDIATE MEDICAL CARE IF:   There is an increase in blood in the urine or you are passing clots.   There is difficulty passing urine.   You develop the chills.   You have an oral temperature above 102 F (38.9 C), not controlled by medicine.   Belly (abdominal) pain develops.  Document Released: 08/18/2004 Document Revised: 01/18/2011 Document Reviewed: 06/16/2007 [facility] Patient Information 2012 [place], [place].Marland Kitchen

## 2011-06-12 NOTE — Transfer of Care (Signed)
Immediate Anesthesia Transfer of Care Note  Patient: Jonathan Goodwin  Procedure(s) Performed: Procedure(s) (LRB): CYSTOSCOPY/URETHROTOMY (N/A) URETHROGRAM (N/A)  Patient Location: PACU  Anesthesia Type: General  Level of Consciousness: awake  Airway & Oxygen Therapy: Patient Spontanous Breathing and non-rebreather face mask  Post-op Assessment: Report given to PACU RN, Post -op Vital signs reviewed and stable and Patient moving all extremities  Post vital signs: Reviewed and stable  Complications: No apparent anesthesia complications

## 2011-06-12 NOTE — Progress Notes (Signed)
Foley drainage bag changed to leg bag per pt request. Pt refuses to take drainage bag home to be used at night. "what do I want that for." Foley draining clear pink urine and connected to leg bag. Pt continues to complain about leg bag and how it does not work well when he takes a shower. Leg straps secured and pt states that the leg bag feels fine.

## 2011-06-12 NOTE — Progress Notes (Signed)
Dressed self. Coffee given to drink. Pt states he prefers an AM appt. D/C instructions given to wife and pt. Wife voiced understanding. Pt continues to complain about appt time. Dr Lawerance Cruel office # given and explained to pt that he can call and make an appt at his convience. Instructed wife to go and get car and pull up to door. Pt refuses to wait for wife to get the car and states, "why can't we both just walk to the car?" Informed pt that him and his wife could leave and walk to the car themselves. Pt states "I just want to go home and she is driving my truck and she doesn't like to drive it."

## 2011-06-12 NOTE — Op Note (Signed)
NAME:  JAKHARI, SPACE                 ACCOUNT NO.:  [id number]  MEDICAL RECORD NO.:  [id number]  LOCATION:  [facility]                          FACILITY:  [facility]  PHYSICIAN:  Ky Barban, M.D.DATE OF BIRTH:  Oct 08, 1945  DATE OF PROCEDURE: DATE OF DISCHARGE:  06/12/2011                              OPERATIVE REPORT   SURGEON:  Ky Barban, MD  PREOPERATIVE DIAGNOSIS:  Urethral stricture.  POSTOPERATIVE DIAGNOSIS:  Urethral stricture.  PROCEDURE:  Retrograde urethrogram, optical urethrotomy.  ANESTHESIA:  Spinal.  PROCEDURE:  The patient under spinal anesthesia in lithotomy position. After usual prep and drape, the glans penis was grabbed with the help of a Brodney clamp and solution of Hypaque and KY jelly was injected into the urethra under fluoroscopic control.  There was a stricture in the bulbar urethra.  Rest of the anterior urethra looks normal.  Now, the Brodney clamp was removed and optical urethrotome was introduced under direct vision.  I went to the level of the stricture and #3 whistle-tip catheter was passed through it and urethrotomy was made at 12 o'clock position and urethrotome was advanced into the bladder without any difficulty, and at this point, bladder was inspected, it was 2+ trabeculated.  Prostatic urethra shows partial obstruction from lateral lobe, hypertrophy, and small median lobe.  The urethrotome was removed, #18 Foley catheter left in for drainage.  The patient left the operating room in satisfactory condition.     Ky Barban, M.D.     MIJ/MEDQ  D:  06/12/2011  T:  06/12/2011  Job:  [id number]

## 2011-06-12 NOTE — Progress Notes (Signed)
No change in H&P on reexamination. 

## 2011-06-12 NOTE — Progress Notes (Signed)
Patient very agitated trying to come out of stretcher. Refusing to go home with catheter in. Combative and angry.. Dr Jerre Simon notified coming to speak with patient.

## 2011-06-12 NOTE — Anesthesia Postprocedure Evaluation (Signed)
Anesthesia Post Note  Patient: Jonathan Goodwin  Procedure(s) Performed: Procedure(s) (LRB): CYSTOSCOPY/URETHROTOMY (N/A) URETHROGRAM (N/A)  Anesthesia type: General  Patient location: PACU  Post pain: Pain level controlled  Post assessment: Post-op Vital signs reviewed, Patient's Cardiovascular Status Stable, Respiratory Function Stable, Patent Airway, No signs of Nausea or vomiting and Pain level controlled  Last Vitals:  Filed Vitals:   06/12/11 1013  BP: 142/80  Pulse: 44  Temp: 36.4 C  Resp: 17    Post vital signs: Reviewed and stable  Level of consciousness: awake and alert   Complications: No apparent anesthesia complications

## 2011-06-15 ENCOUNTER — Encounter (HOSPITAL_COMMUNITY): Payer: Self-pay | Admitting: Urology

## 2011-09-13 ENCOUNTER — Ambulatory Visit (INDEPENDENT_AMBULATORY_CARE_PROVIDER_SITE_OTHER): Payer: Medicare Other | Admitting: Psychology

## 2011-09-13 DIAGNOSIS — F339 Major depressive disorder, recurrent, unspecified: Secondary | ICD-10-CM

## 2011-09-27 ENCOUNTER — Encounter (HOSPITAL_COMMUNITY): Payer: Self-pay | Admitting: Psychology

## 2011-09-27 ENCOUNTER — Ambulatory Visit (INDEPENDENT_AMBULATORY_CARE_PROVIDER_SITE_OTHER): Payer: Medicare Other | Admitting: Psychology

## 2011-09-27 DIAGNOSIS — F339 Major depressive disorder, recurrent, unspecified: Secondary | ICD-10-CM

## 2011-09-27 NOTE — Progress Notes (Signed)
Patient:   Jonathan Goodwin   DOB:   1945-12-14  MR Number:  [id number]  Location:  [facility] [address] [place] [address] Dept: [phone]           Date of Service:   09/13/2011  Start Time:   3 PM End Time:   4 PM  Provider/Observer:  Hershal Coria PSYD       Billing Code/Service: [phone]  Chief Complaint:     Chief Complaint  Patient presents with  . Depression    Reason for Service:  The patient was referred by Dwyane Luo, PA do to significant symptoms of depression. The patient reports that he did start experiencing episodes of depression a few years ago and was tried on different medications including Zoloft. He reports that his symptoms improved he needed up stopping the medication. The patient reports that things went fine for a while and then he began having significant stressors related to his daughter Corrie Dandy. The patient is extremely close to her and feels a great need to take care of her as she has some cognitive limitations. The patient reports that the first episode of depression coincided with his retirement in the diagnosis of cancer but now has to do with the stressors related to his daughter.  Current Status:  The patient describes significant symptoms of depression including feelings of helplessness and hopelessness, anxiety, appetite changes, sleep disturbance, loss of interest, excessive worrying, obsessive thoughts, and poor cognitive functioning.  Reliability of Information: While the information was provided solely by the patient did appear to be valid.  Behavioral Observation: [name]  presents as a 66 y.o.-year-old Right Caucasian Male who appeared his stated age. his dress was Appropriate and he was Well Groomed and his manners were Appropriate to the situation.  There were not any physical disabilities noted.  he displayed an appropriate level of cooperation and  motivation.    Interactions:    Active   Attention:   Distracted by internal worries and contemplations.  Memory:   within normal limits  Visuo-spatial:   within normal limits  Speech (Volume):  low  Speech:   normal pitch  Thought Process:  Coherent  Though Content:  WNL  Orientation:   person, place, time/date and situation  Judgment:   Fair  Planning:   Fair  Affect:    Labile  Mood:    Depressed  Insight:   Good  Intelligence:   high  Marital Status/Living: The patient was born and raised in [place] and currently lives with his wife. He was married in 1967 and divorced in 1990 is now remarried. The patient is a 67 year old sister and a 17 year old brother. The patient is having a major stress right now have to do with one of his children (his daughter Corrie Dandy) who is struggling mildly in her own life and the patient is constantly becoming obsessed with what is happening with her.  Current Employment: The patient is retired  Substance Use:  No concerns of substance abuse are reported.  the patient did have a DUI in 1989 but denies any current substance use.  Education:   GED  Medical History:   Past Medical History  Diagnosis Date  . H/O renal calculi   . Urethral stricture   . Wears hearing aid     right ear  . Hearing impaired person, right   . Arthritis   . Prostate cancer  10/24/07    radiation therapy  . Macular degeneration   . Ulcerative colitis         Outpatient Encounter Prescriptions as of 09/27/2011  Medication Sig Dispense Refill  . buPROPion (WELLBUTRIN SR) 150 MG 12 hr tablet Take 150 mg by mouth 2 (two) times daily.              Sexual History:   History  Sexual Activity  . Sexually Active:     Abuse/Trauma History: The patient denies any history of abuse or trauma.  Psychiatric History:  The patient knowledge is being treated for depression back several years ago where they used Zoloft after his retirement her diagnosis of cancer.  The patient responded well to stop the medication if his symptoms diminished.  Family Med/Psych History: No family history on file.  Risk of Suicide/Violence: low   Impression/DX:  At this point, clear that the patient is experiencing significant psychosocial stressors related directly to his daughter Corrie Dandy he was having numerous problems in her life. The patient feels compelled to take care of her and for her because of her limitations better problematic than his other children. The patient has become quite fixated obsessed about her overall well-being.  Disposition/Plan:  We will set up for individual psychotherapeutic interventions and monitor for medications and whether the current strategy of Wellbutrin is efficient.  Diagnosis:    Axis I:   1. Major depression, recurrent, chronic         Axis II: Deferred       Axis IV:  other psychosocial or environmental problems          Axis V:  51-60 moderate symptoms

## 2011-09-27 NOTE — Progress Notes (Signed)
Patient:   Jonathan Goodwin   DOB:   05/01/1945  MR Number:  [id number]  [facility]  [facility] [address] [place] [address] Dept: [phone]           Date of Service:   09/13/2011  Start Time:   3 PM End Time:   4 PM  Provider/Observer:  John R Rodenbough PSYD       Billing Code/Service: [id number]  Chief Complaint:     Chief Complaint  Patient presents with  . Depression    Reason for Service:  The patient was referred by Ben Mann, PA do to significant symptoms of depression. The patient reports that he did start experiencing episodes of depression a few years ago and was tried on different medications including Zoloft. He reports that his symptoms improved he needed up stopping the medication. The patient reports that things went fine for a while and then he began having significant stressors related to his daughter Mary. The patient is extremely close to her and feels a great need to take care of her as she has some cognitive limitations. The patient reports that the first episode of depression coincided with his retirement in the diagnosis of cancer but now has to do with the stressors related to his daughter.  Current Status:  The patient describes significant symptoms of depression including feelings of helplessness and hopelessness, anxiety, appetite changes, sleep disturbance, loss of interest, excessive worrying, obsessive thoughts, and poor cognitive functioning.  Reliability of Information: While the information was provided solely by the patient did appear to be valid.  Behavioral Observation: Jonathan Goodwin  presents as a 66 y.o.-year-old Right Caucasian Male who appeared his stated age. his dress was Appropriate and he was Well Groomed and his manners were Appropriate to the situation.  There were not any physical disabilities noted.  he displayed an appropriate level of cooperation and  motivation.    Interactions:    Active   Attention:   Distracted by internal worries and contemplations.  Memory:   within normal limits  Visuo-spatial:   within normal limits  Speech (Volume):  low  Speech:   normal pitch  Thought Process:  Coherent  Though Content:  WNL  Orientation:   person, place, time/date and situation  Judgment:   Fair  Planning:   Fair  Affect:    Labile  Mood:    Depressed  Insight:   Good  Intelligence:   high  Marital Status/Living: The patient was born and raised in [place] and currently lives with his wife. He was married in 1967 and divorced in 1990 is now remarried. The patient is a 70-year-old sister and a 60-year-old brother. The patient is having a major stress right now have to do with one of his children (his daughter Mary) who is struggling mildly in her own life and the patient is constantly becoming obsessed with what is happening with her.  Current Employment: The patient is retired  Substance Use:  No concerns of substance abuse are reported.  the patient did have a DUI in 1989 but denies any current substance use.  Education:   GED  Medical History:   Past Medical History  Diagnosis Date  . H/O renal calculi   . Urethral stricture   . Wears hearing aid     right ear  . Hearing impaired person, right   . Arthritis   . Prostate cancer   10/24/07    radiation therapy  . Macular degeneration   . Ulcerative colitis         Outpatient Encounter Prescriptions as of 09/27/2011  Medication Sig Dispense Refill  . buPROPion (WELLBUTRIN SR) 150 MG 12 hr tablet Take 150 mg by mouth 2 (two) times daily.              Sexual History:   History  Sexual Activity  . Sexually Active:     Abuse/Trauma History: The patient denies any history of abuse or trauma.  Psychiatric History:  The patient knowledge is being treated for depression back several years ago where they used Zoloft after his retirement her diagnosis of cancer.  The patient responded well to stop the medication if his symptoms diminished.  Family Med/Psych History: No family history on file.  Risk of Suicide/Violence: low   Impression/DX:  At this point, clear that the patient is experiencing significant psychosocial stressors related directly to his daughter Mary he was having numerous problems in her life. The patient feels compelled to take care of her and for her because of her limitations better problematic than his other children. The patient has become quite fixated obsessed about her overall well-being.  Disposition/Plan:  We will set up for individual psychotherapeutic interventions and monitor for medications and whether the current strategy of Wellbutrin is efficient.  Diagnosis:    Axis I:   1. Major depression, recurrent, chronic         Axis II: Deferred       Axis IV:  other psychosocial or environmental problems          Axis V:  51-60 moderate symptoms    

## 2011-10-10 ENCOUNTER — Ambulatory Visit (HOSPITAL_COMMUNITY): Payer: Self-pay | Admitting: Psychology

## 2011-10-17 ENCOUNTER — Ambulatory Visit (INDEPENDENT_AMBULATORY_CARE_PROVIDER_SITE_OTHER): Payer: Medicare Other | Admitting: Psychology

## 2011-10-17 DIAGNOSIS — F339 Major depressive disorder, recurrent, unspecified: Secondary | ICD-10-CM

## 2011-10-18 ENCOUNTER — Encounter (HOSPITAL_COMMUNITY): Payer: Self-pay | Admitting: Psychology

## 2011-10-18 NOTE — Progress Notes (Signed)
The patient comes in today reporting that he is doing much better with the symptoms of depression. The patient reports that he is now stopped taking Wellbutrin and his She is ready to stop it for psychotherapeutic interventions as well. He is actively used the cognitive therapeutic interventions we've developed and feels like he is ready to handle the situation on is on. The patient reports that his symptoms have improved greatly including biological symptoms of crying, sleep disturbance, appetite change. At this point, we will not immediately closes file and discharge the patient is are made available to the patient if something changes in her future.

## 2012-05-06 ENCOUNTER — Emergency Department (HOSPITAL_COMMUNITY)
Admission: EM | Admit: 2012-05-06 | Discharge: 2012-05-06 | Disposition: A | Discharge status: Home / Self Care | Payer: Medicare Other | Attending: Emergency Medicine | Admitting: Emergency Medicine

## 2012-05-06 ENCOUNTER — Encounter (HOSPITAL_COMMUNITY): Payer: Self-pay | Admitting: Emergency Medicine

## 2012-05-06 DIAGNOSIS — M25531 Pain in right wrist: Secondary | ICD-10-CM

## 2012-05-06 DIAGNOSIS — M7022 Olecranon bursitis, left elbow: Secondary | ICD-10-CM

## 2012-05-06 DIAGNOSIS — Z87448 Personal history of other diseases of urinary system: Secondary | ICD-10-CM | POA: Insufficient documentation

## 2012-05-06 DIAGNOSIS — Z87442 Personal history of urinary calculi: Secondary | ICD-10-CM | POA: Insufficient documentation

## 2012-05-06 DIAGNOSIS — M79609 Pain in unspecified limb: Secondary | ICD-10-CM | POA: Insufficient documentation

## 2012-05-06 DIAGNOSIS — Z8669 Personal history of other diseases of the nervous system and sense organs: Secondary | ICD-10-CM | POA: Insufficient documentation

## 2012-05-06 DIAGNOSIS — Z923 Personal history of irradiation: Secondary | ICD-10-CM | POA: Insufficient documentation

## 2012-05-06 DIAGNOSIS — Z8546 Personal history of malignant neoplasm of prostate: Secondary | ICD-10-CM | POA: Insufficient documentation

## 2012-05-06 DIAGNOSIS — Z8719 Personal history of other diseases of the digestive system: Secondary | ICD-10-CM | POA: Insufficient documentation

## 2012-05-06 DIAGNOSIS — F17200 Nicotine dependence, unspecified, uncomplicated: Secondary | ICD-10-CM | POA: Insufficient documentation

## 2012-05-06 DIAGNOSIS — M25539 Pain in unspecified wrist: Secondary | ICD-10-CM | POA: Insufficient documentation

## 2012-05-06 DIAGNOSIS — M79641 Pain in right hand: Secondary | ICD-10-CM

## 2012-05-06 DIAGNOSIS — Z79899 Other long term (current) drug therapy: Secondary | ICD-10-CM | POA: Insufficient documentation

## 2012-05-06 DIAGNOSIS — M7020 Olecranon bursitis, unspecified elbow: Secondary | ICD-10-CM | POA: Insufficient documentation

## 2012-05-06 MED ORDER — PREDNISONE 10 MG PO TABS: 20.0000 mg | ORAL_TABLET | Freq: Every day | ORAL | Status: DC

## 2012-05-06 MED ORDER — PREDNISONE 50 MG PO TABS
60.0000 mg | ORAL_TABLET | Freq: Once | ORAL | Status: AC
  Administered 2012-05-06: 60 mg via ORAL
  Filled 2012-05-06: qty 1

## 2012-05-06 MED ORDER — HYDROCODONE-ACETAMINOPHEN 5-325 MG PO TABS
1.0000 | ORAL_TABLET | Freq: Once | ORAL | Status: AC
  Administered 2012-05-06: 1 via ORAL
  Filled 2012-05-06: qty 1

## 2012-05-06 MED ORDER — HYDROCODONE-ACETAMINOPHEN 5-325 MG PO TABS: 1.0000 | ORAL_TABLET | ORAL | Status: DC | PRN

## 2012-05-06 NOTE — ED Provider Notes (Signed)
History     CSN: [id number]  Arrival date & time 05/06/12  7829   First MD Initiated Contact with Patient 05/06/12 [phone]      Chief Complaint  Patient presents with  . Hand Pain    (Consider location/radiation/quality/duration/timing/severity/associated sxs/prior treatment) HPI Jonathan Goodwin is a 67 y.o. male who presents to the Emergency Department complaining of right hand and wrist pain that began on Saturday and has gotten worse. He has not had any activity to cause the pain. He has taken naprosyn x 2 since 1 AM.     Past Medical History  Diagnosis Date  . H/O renal calculi   . Urethral stricture   . Wears hearing aid     right ear  . Hearing impaired person, right   . Arthritis   . Prostate cancer 10/24/07    radiation therapy  . Macular degeneration   . Ulcerative colitis     Past Surgical History  Procedure Laterality Date  . Knee arthroscopy  1993    right  . Appendectomy  2010    [facility], Dr. Malvin Johns  . Shoulder surgery      left, for chronic dislocation  . Cystoscopy  2010    [facility], Dr. Rito Ehrlich  . Urethrotomy  06/12/2011    Procedure: CYSTOSCOPY/URETHROTOMY;  Surgeon: Ky Barban, MD;  Location: [facility];  Service: Urology;  Laterality: N/A;  optical urethrotomy    No family history on file.  History  Substance Use Topics  . Smoking status: Current Every Day Smoker -- 1.00 packs/day for 56 years    Types: Cigarettes  . Smokeless tobacco: Not on file  . Alcohol Use: Yes     Comment: occasional      Review of Systems  Constitutional: Negative for fever.       10 Systems reviewed and are negative for acute change except as noted in the HPI.  HENT: Negative for congestion.   Eyes: Negative for discharge and redness.  Respiratory: Negative for cough and shortness of breath.   Cardiovascular: Negative for chest pain.  Gastrointestinal: Negative for vomiting and abdominal pain.  Musculoskeletal: Negative for back pain.       Right hand and wrist  pain  Skin: Negative for rash.  Neurological: Negative for syncope, numbness and headaches.  Psychiatric/Behavioral:       No behavior change.    Allergies  Review of patient's allergies indicates no known allergies.  Home Medications   Current Outpatient Rx  Name  Route  Sig  Dispense  Refill  . buPROPion (WELLBUTRIN SR) 150 MG 12 hr tablet   Oral   Take 150 mg by mouth 2 (two) times daily.           BP 122/80  Pulse 65  Temp(Src) 98.6 F (37 C) (Oral)  Resp 18  Ht 6' 1.5" (1.867 m)  Wt 200 lb (90.719 kg)  BMI 26.03 kg/m2  SpO2 95%  Physical Exam  Nursing note and vitals reviewed. Constitutional: He appears well-developed and well-nourished.  Awake, alert, nontoxic appearance.  HENT:  Head: Normocephalic and atraumatic.  Eyes: EOM are normal. Pupils are equal, round, and reactive to light. Right eye exhibits no discharge. Left eye exhibits no discharge.  Neck: Neck supple.  Cardiovascular: Normal rate and intact distal pulses.   Pulmonary/Chest: Effort normal and breath sounds normal. He exhibits no tenderness.  Abdominal: Soft. Bowel sounds are normal. There is no tenderness. There is no rebound.  Musculoskeletal: He exhibits  no tenderness.  Baseline ROM, no obvious new focal weakness.Arthitic changes to both hands. Swelling in right hand. Pulses are 2+. Left elbow with bursitis.  Neurological:  Mental status and motor strength appears baseline for patient and situation.  Skin: No rash noted.  Psychiatric: He has a normal mood and affect.    ED Course  Procedures (including critical care time)     MDM  Patient with pain and swelling to his right hand and wrist. No known injury or activity. Given hydrocodone and prednisone. Pt stable in ED with no significant deterioration in condition.The patient appears reasonably screened and/or stabilized for discharge and I doubt any other medical condition or other [facility] requiring further screening, evaluation, or  treatment in the ED at this time prior to discharge.  MDM Reviewed: nursing note and vitals           Nicoletta Dress. Colon Branch, MD 05/06/12 [phone]

## 2012-05-06 NOTE — ED Notes (Signed)
Pt c/o pain and swelling to right hand. Swelling noted to thumb, and knuckles. Pt states he has this pain periodically and that is has progressively been getting worse.

## 2012-05-06 NOTE — ED Notes (Signed)
Pt c/o wrist and hand pain and denies any injury to the area.

## 2013-01-02 ENCOUNTER — Encounter (HOSPITAL_COMMUNITY): Payer: Self-pay | Admitting: Pharmacy Technician

## 2013-01-05 ENCOUNTER — Encounter (HOSPITAL_COMMUNITY)
Admission: RE | Admit: 2013-01-05 | Discharge: 2013-01-05 | Disposition: A | Discharge status: Home / Self Care | Payer: Medicare Other | Source: Ambulatory Visit | Attending: Ophthalmology | Admitting: Ophthalmology

## 2013-01-05 ENCOUNTER — Encounter (HOSPITAL_COMMUNITY): Payer: Self-pay

## 2013-01-05 DIAGNOSIS — Z01810 Encounter for preprocedural cardiovascular examination: Secondary | ICD-10-CM | POA: Insufficient documentation

## 2013-01-05 DIAGNOSIS — Z01812 Encounter for preprocedural laboratory examination: Secondary | ICD-10-CM | POA: Insufficient documentation

## 2013-01-05 HISTORY — DX: Personal history of urinary calculi: Z87.442

## 2013-01-05 LAB — BASIC METABOLIC PANEL
BUN: 17 mg/dL (ref 6–23)
CO2: 29 mEq/L (ref 19–32)
Chloride: 105 mEq/L (ref 96–112)
Glucose, Bld: 97 mg/dL (ref 70–99)
Potassium: 4.4 mEq/L (ref 3.5–5.1)

## 2013-01-05 NOTE — Patient Instructions (Signed)
Your procedure is scheduled on: 01/15/2013  Report to [facility] at  1020  AM.  Call this number if you have problems the morning of surgery: [phone]   Do not eat food or drink liquids :After Midnight.      Take these medicines the morning of surgery with A SIP OF WATER: norco, wellbutrin, prednisone   Do not wear jewelry, make-up or nail polish.  Do not wear lotions, powders, or perfumes.   Do not shave 48 hours prior to surgery.  Do not bring valuables to the hospital.  Contacts, dentures or bridgework may not be worn into surgery.  Leave suitcase in the car. After surgery it may be brought to your room.  For patients admitted to the hospital, checkout time is 11:00 AM the day of discharge.   Patients discharged the day of surgery will not be allowed to drive home.  :     Please read over the following fact sheets that you were given: Coughing and Deep Breathing, Surgical Site Infection Prevention, Anesthesia Post-op Instructions and Care and Recovery After Surgery    Cataract A cataract is a clouding of the lens of the eye. When a lens becomes cloudy, vision is reduced based on the degree and nature of the clouding. Many cataracts reduce vision to some degree. Some cataracts make people more near-sighted as they develop. Other cataracts increase glare. Cataracts that are ignored and become worse can sometimes look white. The white color can be seen through the pupil. CAUSES   Aging. However, cataracts may occur at any age, even in newborns.   Certain drugs.   Trauma to the eye.   Certain diseases such as diabetes.   Specific eye diseases such as chronic inflammation inside the eye or a sudden attack of a rare form of glaucoma.   Inherited or acquired medical problems.  SYMPTOMS   Gradual, progressive drop in vision in the affected eye.   Severe, rapid visual loss. This most often happens when trauma is the cause.  DIAGNOSIS  To detect a cataract, an eye doctor examines  the lens. Cataracts are best diagnosed with an exam of the eyes with the pupils enlarged (dilated) by drops.  TREATMENT  For an early cataract, vision may improve by using different eyeglasses or stronger lighting. If that does not help your vision, surgery is the only effective treatment. A cataract needs to be surgically removed when vision loss interferes with your everyday activities, such as driving, reading, or watching TV. A cataract may also have to be removed if it prevents examination or treatment of another eye problem. Surgery removes the cloudy lens and usually replaces it with a substitute lens (intraocular lens, IOL).  At a time when both you and your doctor agree, the cataract will be surgically removed. If you have cataracts in both eyes, only one is usually removed at a time. This allows the operated eye to heal and be out of danger from any possible problems after surgery (such as infection or poor wound healing). In rare cases, a cataract may be doing damage to your eye. In these cases, your caregiver may advise surgical removal right away. The vast majority of people who have cataract surgery have better vision afterward. HOME CARE INSTRUCTIONS  If you are not planning surgery, you may be asked to do the following:  Use different eyeglasses.   Use stronger or brighter lighting.   Ask your eye doctor about reducing your medicine dose or changing  medicines if it is thought that a medicine caused your cataract. Changing medicines does not make the cataract go away on its own.   Become familiar with your surroundings. Poor vision can lead to injury. Avoid bumping into things on the affected side. You are at a higher risk for tripping or falling.   Exercise extreme care when driving or operating machinery.   Wear sunglasses if you are sensitive to bright light or experiencing problems with glare.  SEEK IMMEDIATE MEDICAL CARE IF:   You have a worsening or sudden vision loss.    You notice redness, swelling, or increasing pain in the eye.   You have a fever.  Document Released: 01/29/2005 Document Revised: 01/18/2011 Document Reviewed: 09/22/2010 [facility] Patient Information 2012 [place], [place].PATIENT INSTRUCTIONS POST-ANESTHESIA  IMMEDIATELY FOLLOWING SURGERY:  Do not drive or operate machinery for the first twenty four hours after surgery.  Do not make any important decisions for twenty four hours after surgery or while taking narcotic pain medications or sedatives.  If you develop intractable nausea and vomiting or a severe headache please notify your doctor immediately.  FOLLOW-UP:  Please make an appointment with your surgeon as instructed. You do not need to follow up with anesthesia unless specifically instructed to do so.  WOUND CARE INSTRUCTIONS (if applicable):  Keep a dry clean dressing on the anesthesia/puncture wound site if there is drainage.  Once the wound has quit draining you may leave it open to air.  Generally you should leave the bandage intact for twenty four hours unless there is drainage.  If the epidural site drains for more than 36-48 hours please call the anesthesia department.  QUESTIONS?:  Please feel free to call your physician or the hospital operator if you have any questions, and they will be happy to assist you.

## 2013-01-14 MED ORDER — LIDOCAINE HCL (PF) 1 % IJ SOLN
INTRAMUSCULAR | Status: AC
  Filled 2013-01-14: qty 2

## 2013-01-14 MED ORDER — LIDOCAINE HCL 3.5 % OP GEL
OPHTHALMIC | Status: AC
  Filled 2013-01-14: qty 1

## 2013-01-14 MED ORDER — CYCLOPENTOLATE-PHENYLEPHRINE OP SOLN OPTIME - NO CHARGE
OPHTHALMIC | Status: AC
  Filled 2013-01-14: qty 2

## 2013-01-14 MED ORDER — PHENYLEPHRINE HCL 2.5 % OP SOLN
OPHTHALMIC | Status: AC
  Filled 2013-01-14: qty 15

## 2013-01-14 MED ORDER — NEOMYCIN-POLYMYXIN-DEXAMETH 3.5-10000-0.1 OP SUSP
OPHTHALMIC | Status: AC
  Filled 2013-01-14: qty 5

## 2013-01-14 MED ORDER — TETRACAINE HCL 0.5 % OP SOLN
OPHTHALMIC | Status: AC
  Filled 2013-01-14: qty 2

## 2013-01-15 ENCOUNTER — Ambulatory Visit (HOSPITAL_COMMUNITY)
Admission: RE | Admit: 2013-01-15 | Discharge: 2013-01-15 | Disposition: A | Discharge status: Home / Self Care | Payer: Medicare Other | Source: Ambulatory Visit | Attending: Ophthalmology | Admitting: Ophthalmology

## 2013-01-15 ENCOUNTER — Encounter (HOSPITAL_COMMUNITY): Payer: Self-pay | Admitting: *Deleted

## 2013-01-15 ENCOUNTER — Ambulatory Visit (HOSPITAL_COMMUNITY): Payer: Medicare Other | Admitting: Anesthesiology

## 2013-01-15 ENCOUNTER — Encounter (HOSPITAL_COMMUNITY): Payer: Medicare Other | Admitting: Anesthesiology

## 2013-01-15 ENCOUNTER — Encounter (HOSPITAL_COMMUNITY)
Admission: RE | Disposition: A | Discharge status: Home / Self Care | Payer: Self-pay | Source: Ambulatory Visit | Attending: Ophthalmology

## 2013-01-15 DIAGNOSIS — H2589 Other age-related cataract: Secondary | ICD-10-CM | POA: Insufficient documentation

## 2013-01-15 DIAGNOSIS — J449 Chronic obstructive pulmonary disease, unspecified: Secondary | ICD-10-CM | POA: Insufficient documentation

## 2013-01-15 DIAGNOSIS — J4489 Other specified chronic obstructive pulmonary disease: Secondary | ICD-10-CM | POA: Insufficient documentation

## 2013-01-15 HISTORY — PX: CATARACT EXTRACTION W/PHACO: SHX586

## 2013-01-15 SURGERY — PHACOEMULSIFICATION, CATARACT, WITH IOL INSERTION
Anesthesia: Monitor Anesthesia Care | Site: Eye | Laterality: Left

## 2013-01-15 MED ORDER — PHENYLEPHRINE HCL 2.5 % OP SOLN
1.0000 [drp] | OPHTHALMIC | Status: AC
  Administered 2013-01-15 (×3): 1 [drp] via OPHTHALMIC

## 2013-01-15 MED ORDER — MIDAZOLAM HCL 2 MG/2ML IJ SOLN
INTRAMUSCULAR | Status: AC
  Filled 2013-01-15: qty 2

## 2013-01-15 MED ORDER — MIDAZOLAM HCL 2 MG/2ML IJ SOLN
1.0000 mg | INTRAMUSCULAR | Status: DC | PRN
  Administered 2013-01-15 (×2): 2 mg via INTRAVENOUS

## 2013-01-15 MED ORDER — FENTANYL CITRATE 0.05 MG/ML IJ SOLN: 25.0000 ug | INTRAMUSCULAR | Status: DC | PRN

## 2013-01-15 MED ORDER — BSS IO SOLN
INTRAOCULAR | Status: DC | PRN
  Administered 2013-01-15: 15 mL via INTRAOCULAR

## 2013-01-15 MED ORDER — NEOMYCIN-POLYMYXIN-DEXAMETH 3.5-10000-0.1 OP SUSP
OPHTHALMIC | Status: DC | PRN
  Administered 2013-01-15: 2 [drp] via OPHTHALMIC

## 2013-01-15 MED ORDER — PROVISC 10 MG/ML IO SOLN
INTRAOCULAR | Status: DC | PRN
  Administered 2013-01-15: 0.85 mL via INTRAOCULAR

## 2013-01-15 MED ORDER — POVIDONE-IODINE 5 % OP SOLN
OPHTHALMIC | Status: DC | PRN
  Administered 2013-01-15: 1 via OPHTHALMIC

## 2013-01-15 MED ORDER — EPINEPHRINE HCL 1 MG/ML IJ SOLN
INTRAMUSCULAR | Status: AC
  Filled 2013-01-15: qty 1

## 2013-01-15 MED ORDER — ONDANSETRON HCL 4 MG/2ML IJ SOLN: 4.0000 mg | Freq: Once | INTRAMUSCULAR | Status: DC | PRN

## 2013-01-15 MED ORDER — CYCLOPENTOLATE-PHENYLEPHRINE 0.2-1 % OP SOLN
1.0000 [drp] | OPHTHALMIC | Status: AC
  Administered 2013-01-15 (×3): 1 [drp] via OPHTHALMIC

## 2013-01-15 MED ORDER — TETRACAINE HCL 0.5 % OP SOLN
1.0000 [drp] | OPHTHALMIC | Status: AC
  Administered 2013-01-15 (×3): 1 [drp] via OPHTHALMIC

## 2013-01-15 MED ORDER — FENTANYL CITRATE 0.05 MG/ML IJ SOLN
25.0000 ug | INTRAMUSCULAR | Status: AC
  Administered 2013-01-15 (×2): 25 ug via INTRAVENOUS

## 2013-01-15 MED ORDER — LACTATED RINGERS IV SOLN
INTRAVENOUS | Status: DC
  Administered 2013-01-15: 1000 mL via INTRAVENOUS

## 2013-01-15 MED ORDER — EPINEPHRINE HCL 1 MG/ML IJ SOLN
INTRAOCULAR | Status: DC | PRN
  Administered 2013-01-15: 12:00:00

## 2013-01-15 MED ORDER — LIDOCAINE HCL (PF) 1 % IJ SOLN
INTRAMUSCULAR | Status: DC | PRN
  Administered 2013-01-15: .6 mL

## 2013-01-15 MED ORDER — LIDOCAINE HCL 3.5 % OP GEL
1.0000 "application " | Freq: Once | OPHTHALMIC | Status: AC
  Administered 2013-01-15: 1 via OPHTHALMIC

## 2013-01-15 MED ORDER — FENTANYL CITRATE 0.05 MG/ML IJ SOLN
INTRAMUSCULAR | Status: AC
  Filled 2013-01-15: qty 2

## 2013-01-15 SURGICAL SUPPLY — 32 items
CAPSULAR TENSION RING-AMO (OPHTHALMIC RELATED) IMPLANT
CLOTH BEACON ORANGE TIMEOUT ST (SAFETY) ×1 IMPLANT
EYE SHIELD UNIVERSAL CLEAR (GAUZE/BANDAGES/DRESSINGS) ×1 IMPLANT
GLOVE BIO SURGEON STRL SZ 6.5 (GLOVE) IMPLANT
GLOVE BIOGEL PI IND STRL 6.5 (GLOVE) IMPLANT
GLOVE BIOGEL PI IND STRL 7.0 (GLOVE) IMPLANT
GLOVE BIOGEL PI IND STRL 7.5 (GLOVE) IMPLANT
GLOVE BIOGEL PI INDICATOR 6.5 (GLOVE)
GLOVE BIOGEL PI INDICATOR 7.0 (GLOVE) ×2
GLOVE BIOGEL PI INDICATOR 7.5 (GLOVE)
GLOVE ECLIPSE 6.5 STRL STRAW (GLOVE) IMPLANT
GLOVE ECLIPSE 7.0 STRL STRAW (GLOVE) IMPLANT
GLOVE ECLIPSE 7.5 STRL STRAW (GLOVE) IMPLANT
GLOVE EXAM NITRILE LRG STRL (GLOVE) IMPLANT
GLOVE EXAM NITRILE MD LF STRL (GLOVE) IMPLANT
GLOVE SKINSENSE NS SZ6.5 (GLOVE)
GLOVE SKINSENSE NS SZ7.0 (GLOVE)
GLOVE SKINSENSE STRL SZ6.5 (GLOVE) IMPLANT
GLOVE SKINSENSE STRL SZ7.0 (GLOVE) IMPLANT
KIT VITRECTOMY (OPHTHALMIC RELATED) IMPLANT
PAD ARMBOARD 7.5X6 YLW CONV (MISCELLANEOUS) ×1 IMPLANT
PROC W NO LENS (INTRAOCULAR LENS)
PROC W SPEC LENS (INTRAOCULAR LENS)
PROCESS W NO LENS (INTRAOCULAR LENS) IMPLANT
PROCESS W SPEC LENS (INTRAOCULAR LENS) IMPLANT
RING MALYGIN (MISCELLANEOUS) IMPLANT
SIGHTPATH CAT PROC W REG LENS (Ophthalmic Related) ×2 IMPLANT
SYR TB 1ML LL NO SAFETY (SYRINGE) ×1 IMPLANT
TAPE SURG TRANSPORE 1 IN (GAUZE/BANDAGES/DRESSINGS) IMPLANT
TAPE SURGICAL TRANSPORE 1 IN (GAUZE/BANDAGES/DRESSINGS) ×1
VISCOELASTIC ADDITIONAL (OPHTHALMIC RELATED) IMPLANT
WATER STERILE IRR 250ML POUR (IV SOLUTION) ×1 IMPLANT

## 2013-01-15 NOTE — Anesthesia Postprocedure Evaluation (Signed)
  Anesthesia Post-op Note  Patient: Jonathan Goodwin  Procedure(s) Performed: Procedure(s) with comments: CATARACT EXTRACTION PHACO AND INTRAOCULAR LENS PLACEMENT (IOC) (Left) - CDE:22.42  Patient Location: Short Stay  Anesthesia Type:MAC  Level of Consciousness: awake, alert , oriented and patient cooperative  Airway and Oxygen Therapy: Patient Spontanous Breathing  Post-op Pain: none  Post-op Assessment: Post-op Vital signs reviewed, Patient's Cardiovascular Status Stable, Respiratory Function Stable, Patent Airway, No signs of Nausea or vomiting and Pain level controlled  Post-op Vital Signs: Reviewed and stable  Complications: No apparent anesthesia complications

## 2013-01-15 NOTE — Anesthesia Preprocedure Evaluation (Signed)
Anesthesia Evaluation  Patient identified by MRN, date of birth, ID band Patient awake    Reviewed: Allergy & Precautions, H&P , NPO status , Patient's Chart, lab work & pertinent test results  Airway Mallampati: II TM Distance: >3 FB     Dental  (+) Lower Dentures and Upper Dentures   Pulmonary COPDCurrent Smoker (am cough),  breath sounds clear to auscultation        Cardiovascular negative cardio ROS  Rhythm:Regular Rate:Normal     Neuro/Psych    GI/Hepatic PUD,   Endo/Other    Renal/GU      Musculoskeletal   Abdominal   Peds  Hematology   Anesthesia Other Findings   Reproductive/Obstetrics                           Anesthesia Physical Anesthesia Plan  ASA: III  Anesthesia Plan: MAC   Post-op Pain Management:    Induction: Intravenous  Airway Management Planned: Nasal Cannula  Additional Equipment:   Intra-op Plan:   Post-operative Plan:   Informed Consent: I have reviewed the patients History and Physical, chart, labs and discussed the procedure including the risks, benefits and alternatives for the proposed anesthesia with the patient or authorized representative who has indicated his/her understanding and acceptance.     Plan Discussed with:   Anesthesia Plan Comments:         Anesthesia Quick Evaluation

## 2013-01-15 NOTE — Op Note (Signed)
Date of Admission: 01/15/2013  Date of Surgery: 01/15/2013  Pre-Op Dx: Cataract  Left  Eye  Post-Op Dx: Cataract  Left  Eye,  Dx Code 366.19  Surgeon: Gemma Payor, M.D.  Assistants: None  Anesthesia: Topical with MAC  Indications: Painless, progressive loss of vision with compromise of daily activities.  Surgery: Cataract Extraction with Intraocular lens Implant Left Eye  Discription: The patient had dilating drops and viscous lidocaine placed into the left eye in the pre-op holding area. After transfer to the operating room, a time out was performed. The patient was then prepped and draped. Beginning with a 75 degree blade a paracentesis port was made at the surgeon's 2 o'clock position. The anterior chamber was then filled with 1% non-preserved lidocaine. This was followed by filling the anterior chamber with Provisc. A 2.59mm keratome blade was used to make a clear corneal incision at the temporal limbus. A bent cystatome needle was used to create a continuous tear capsulotomy. Hydrodissection was performed with balanced salt solution on a Fine canula. The lens nucleus was then removed using the phacoemulsification handpiece. Residual cortex was removed with the I&A handpiece. The anterior chamber and capsular bag were refilled with Provisc. A posterior chamber intraocular lens was placed into the capsular bag with it's injector. The implant was positioned with the Kuglan hook. The Provisc was then removed from the anterior chamber and capsular bag with the I&A handpiece. Stromal hydration of the main incision and paracentesis port was performed with BSS on a Fine canula. The wounds were tested for leak which was negative. The patient tolerated the procedure well. There were no operative complications. The patient was then transferred to the recovery room in stable condition.  Complications: None  Specimen: None  EBL: None  Prosthetic device: B&L enVista, MX60, power 20.0D, SN  [id number].

## 2013-01-15 NOTE — Transfer of Care (Signed)
Immediate Anesthesia Transfer of Care Note  Patient: Jonathan Goodwin  Procedure(s) Performed: Procedure(s) with comments: CATARACT EXTRACTION PHACO AND INTRAOCULAR LENS PLACEMENT (IOC) (Left) - CDE:22.42  Patient Location: Short Stay  Anesthesia Type:MAC  Level of Consciousness: awake, alert , oriented and patient cooperative  Airway & Oxygen Therapy: Patient Spontanous Breathing  Post-op Assessment: Report given to PACU RN and Post -op Vital signs reviewed and stable  Post vital signs: Reviewed and stable  Complications: No apparent anesthesia complications

## 2013-01-15 NOTE — H&P (Signed)
I have reviewed the H&P, the patient was re-examined, and I have identified no interval changes in medical condition and plan of care since the history and physical of record  

## 2013-01-19 ENCOUNTER — Encounter (HOSPITAL_COMMUNITY): Payer: Self-pay | Admitting: Ophthalmology

## 2013-02-26 ENCOUNTER — Other Ambulatory Visit: Payer: Self-pay | Admitting: Gastroenterology

## 2013-05-20 ENCOUNTER — Encounter (HOSPITAL_COMMUNITY): Payer: Self-pay | Admitting: Pharmacy Technician

## 2013-05-22 ENCOUNTER — Other Ambulatory Visit: Payer: Self-pay

## 2013-05-22 ENCOUNTER — Encounter (HOSPITAL_COMMUNITY)
Admission: RE | Admit: 2013-05-22 | Discharge: 2013-05-22 | Disposition: A | Discharge status: Home / Self Care | Payer: Medicare Other | Source: Ambulatory Visit | Attending: Urology | Admitting: Urology

## 2013-05-22 ENCOUNTER — Encounter (HOSPITAL_COMMUNITY): Payer: Self-pay

## 2013-05-22 DIAGNOSIS — Z01812 Encounter for preprocedural laboratory examination: Secondary | ICD-10-CM | POA: Insufficient documentation

## 2013-05-22 DIAGNOSIS — Z01810 Encounter for preprocedural cardiovascular examination: Secondary | ICD-10-CM | POA: Insufficient documentation

## 2013-05-22 LAB — BASIC METABOLIC PANEL
BUN: 18 mg/dL (ref 6–23)
CALCIUM: 10.1 mg/dL (ref 8.4–10.5)
CHLORIDE: 105 meq/L (ref 96–112)
CO2: 30 meq/L (ref 19–32)
Creatinine, Ser: 1.28 mg/dL (ref 0.50–1.35)
GFR calc Af Amer: 64 mL/min — ABNORMAL LOW (ref >90)
GFR calc non Af Amer: 55 mL/min — ABNORMAL LOW (ref >90)
GLUCOSE: 82 mg/dL (ref 70–99)
Potassium: 5.3 mEq/L (ref 3.7–5.3)
Sodium: 146 mEq/L (ref 137–147)

## 2013-05-22 LAB — HEMOGLOBIN AND HEMATOCRIT, BLOOD
HCT: 51.1 % (ref 39.0–52.0)
Hemoglobin: 17.1 g/dL — ABNORMAL HIGH (ref 13.0–17.0)

## 2013-05-22 LAB — SURGICAL PCR SCREEN
MRSA, PCR: NEGATIVE
Staphylococcus aureus: NEGATIVE

## 2013-05-22 NOTE — H&P (Signed)
Urology History and Physical Exam  CC: pain rlq  HPI: 68  year old male c/o pain rlq attempt to do cysto in office failed because of stricture he also has prostate ca and had radiotheriapy in the past.  PMH: Past Medical History  Diagnosis Date  . H/O renal calculi   . Urethral stricture   . Wears hearing aid     right ear  . Hearing impaired person, right   . Arthritis   . Prostate cancer 10/24/07    radiation therapy  . Macular degeneration   . Ulcerative colitis   . History of kidney stones     PSH: Past Surgical History  Procedure Laterality Date  . Knee arthroscopy  1993    right  . Appendectomy  2010    [facility], Dr. Romona Curls  . Shoulder surgery      left, for chronic dislocation  . Cystoscopy  2010    [facility], Dr. [place] Pink  . Urethrotomy  06/12/2011    Procedure: CYSTOSCOPY/URETHROTOMY;  Surgeon: Marissa Nestle, MD;  Location: [facility];  Service: Urology;  Laterality: N/A;  optical urethrotomy  . Cataract extraction w/phaco Left 01/15/2013    Procedure: CATARACT EXTRACTION PHACO AND INTRAOCULAR LENS PLACEMENT (IOC);  Surgeon: Tonny Branch, MD;  Location: [facility];  Service: Ophthalmology;  Laterality: Left;  CDE:22.42    Allergies: No Known Allergies  Medications: No prescriptions prior to admission     Social History: History   Social History  . Marital Status: Married    Spouse Name: N/A    Number of Children: N/A  . Years of Education: N/A   Occupational History  . Not on file.   Social History Main Topics  . Smoking status: Current Every Day Smoker -- 1.00 packs/day for 56 years    Types: Cigarettes  . Smokeless tobacco: Not on file  . Alcohol Use: Yes     Comment: occasional  . Drug Use: No  . Sexual Activity: Yes    Birth Control/ Protection: None   Other Topics Concern  . Not on file   Social History Narrative  . No narrative on file    Family History: No family history on file.  Review of Systems: Positive:  Negative: .  A further 10  point review of systems was negative except what is listed in                 the HPI.  Physical Exam: @VITALS2 @ General: No acute distress.  Awake. Head:  Normocephalic.  Atraumatic. ENT:  EOMI.  Mucous membranes moist Neck:  Supple.  No lymphadenopathy. CV:  S1 present. S2 present. Regular rate. Pulmonary: Equal effort bilaterally.  Clear to auscultation bilaterally. Abdomen: Soft tender to palpation rlq Skin:  Normal turgor.  No visible rash. Extremity: No gross deformity of bilateral upper extremities.  No gross deformity of                             lower extremities. Neurologic: Alert. Appropriate mood.  Penis:  circumcised.  No lesions. Urethra: Orthotopic meatus. Scrotum: No lesions.  No ecchymosis.  No erythema. Testicles: Descended bilaterally.  No masses bilaterally. Epididymis: Palpable bilaterally. Nontender to palpation.  Studies:  No results found for this basename: HGB, WBC, PLT,  in the last 72 hours  No results found for this basename: NA, K, CL, CO2, BUN, CREATININE, CALCIUM, MAGNESIUM, GFRNONAA, GFRAA,  in the last 72 hours  No results found for this basename: PT, INR, APTT,  in the last 72 hours   No components found with this basename: ABG,     Assessment:  His problem could be because of stricture although he says has no voiding problem   Marissa Nestle  05/22/2013 10:41 AM

## 2013-05-22 NOTE — Patient Instructions (Signed)
Carmichael Burdette Carpenito  05/22/2013   Your procedure is scheduled on:  05/26/2013  Report to [facility] at  47  AM.  Call this number if you have problems the morning of surgery: [phone]   Remember:   Do not eat food or drink liquids after midnight.   Take these medicines the morning of surgery with A SIP OF WATER:  none   Do not wear jewelry, make-up or nail polish.  Do not wear lotions, powders, or perfumes.   Do not shave 48 hours prior to surgery. Men may shave face and neck.  Do not bring valuables to the hospital.  [facility] is not responsible for any belongings or valuables.               Contacts, dentures or bridgework may not be worn into surgery.  Leave suitcase in the car. After surgery it may be brought to your room.  For patients admitted to the hospital, discharge time is determined by your treatment team.               Patients discharged the day of surgery will not be allowed to drive home.  Name and phone number of your driver: family  Special Instructions: Shower using CHG 2 nights before surgery and the night before surgery.  If you shower the day of surgery use CHG.  Use special wash - you have one bottle of CHG for all showers.  You should use approximately 1/3 of the bottle for each shower.   Please read over the following fact sheets that you were given: Pain Booklet, Coughing and Deep Breathing, Surgical Site Infection Prevention, Anesthesia Post-op Instructions and Care and Recovery After Surgery Cystoscopy Cystoscopy is a procedure that is used to help your caregiver diagnose and sometimes treat conditions that affect your lower urinary tract. Your lower urinary tract includes your bladder and the tube through which urine passes from your bladder out of your body (urethra). Cystoscopy is performed with a thin, tube-shaped instrument (cystoscope). The cystoscope has lenses and a light at the end so that your caregiver can see inside your bladder. The  cystoscope is inserted at the entrance of your urethra. Your caregiver guides it through your urethra and into your bladder. There are two main types of cystoscopy:  Flexible cystoscopy (with a flexible cystoscope).  Rigid cystoscopy (with a rigid cystoscope). Cystoscopy may be recommended for many conditions, including:  Urinary tract infections.  Blood in your urine (hematuria).  Loss of bladder control (urinary incontinence) or overactive bladder.  Unusual cells found in a urine sample.  Urinary blockage.  Painful urination. Cystoscopy may also be done to remove a sample of your tissue to be checked under a microscope (biopsy). It may also be done to remove or destroy bladder stones. LET YOUR CAREGIVER KNOW ABOUT:  Allergies to food or medicine.  Medicines taken, including vitamins, herbs, eyedrops, over-the-counter medicines, and creams.  Use of steroids (by mouth or creams).  Previous problems with anesthetics or numbing medicines.  History of bleeding problems or blood clots.  Previous surgery.  Other health problems, including diabetes and kidney problems.  Possibility of pregnancy, if this applies. PROCEDURE The area around the opening to your urethra will be cleaned. A medicine to numb your urethra (local anesthetic) is used. If a tissue sample or stone is removed during the procedure, you may be given a medicine to make you sleep (general anesthetic). Your caregiver will gently insert  the tip of the cystoscope into your urethra. The cystoscope will be slowly glided through your urethra and into your bladder. Sterile fluid will flow through the cystoscope and into your bladder. The fluid will expand and stretch your bladder. This gives your caregiver a better view of your bladder walls. The procedure lasts about 15 20 minutes. AFTER THE PROCEDURE If a local anesthetic is used, you will be allowed to go home as soon as you are ready. If a general anesthetic is used,  you will be taken to a recovery area until you are stable. You may have temporary bleeding and burning on urination. Document Released: 01/27/2000 Document Revised: 10/24/2011 Document Reviewed: 07/23/2011 [facility] Patient Information 2014 [place]. PATIENT INSTRUCTIONS POST-ANESTHESIA  IMMEDIATELY FOLLOWING SURGERY:  Do not drive or operate machinery for the first twenty four hours after surgery.  Do not make any important decisions for twenty four hours after surgery or while taking narcotic pain medications or sedatives.  If you develop intractable nausea and vomiting or a severe headache please notify your doctor immediately.  FOLLOW-UP:  Please make an appointment with your surgeon as instructed. You do not need to follow up with anesthesia unless specifically instructed to do so.  WOUND CARE INSTRUCTIONS (if applicable):  Keep a dry clean dressing on the anesthesia/puncture wound site if there is drainage.  Once the wound has quit draining you may leave it open to air.  Generally you should leave the bandage intact for twenty four hours unless there is drainage.  If the epidural site drains for more than 36-48 hours please call the anesthesia department.  QUESTIONS?:  Please feel free to call your physician or the hospital operator if you have any questions, and they will be happy to assist you.

## 2013-05-22 NOTE — Pre-Procedure Instructions (Signed)
Patient given information to sign up for my chart at home. 

## 2013-05-26 ENCOUNTER — Encounter (HOSPITAL_COMMUNITY)
Admission: RE | Disposition: A | Discharge status: Home / Self Care | Payer: Self-pay | Source: Ambulatory Visit | Attending: Urology

## 2013-05-26 ENCOUNTER — Encounter (HOSPITAL_COMMUNITY): Payer: Self-pay | Admitting: Anesthesiology

## 2013-05-26 ENCOUNTER — Ambulatory Visit (HOSPITAL_COMMUNITY)
Admission: RE | Admit: 2013-05-26 | Discharge: 2013-05-26 | Disposition: A | Discharge status: Home / Self Care | Payer: Medicare Other | Source: Ambulatory Visit | Attending: Urology | Admitting: Urology

## 2013-05-26 SURGERY — CANCELLED PROCEDURE

## 2013-05-26 MED ORDER — PROPOFOL 10 MG/ML IV EMUL
INTRAVENOUS | Status: AC
  Filled 2013-05-26: qty 20

## 2013-05-26 MED ORDER — LIDOCAINE HCL (PF) 1 % IJ SOLN
INTRAMUSCULAR | Status: AC
  Filled 2013-05-26: qty 5

## 2013-05-26 MED ORDER — FENTANYL CITRATE 0.05 MG/ML IJ SOLN
INTRAMUSCULAR | Status: AC
  Filled 2013-05-26: qty 2

## 2013-05-26 SURGICAL SUPPLY — 13 items
BAG DRAIN URO TABLE W/ADPT NS (DRAPE) ×1 IMPLANT
BAG DRN 8 ADPR NS SKTRN CSTL (DRAPE)
CLOTH BEACON ORANGE TIMEOUT ST (SAFETY) ×1 IMPLANT
GLOVE BIO SURGEON STRL SZ7 (GLOVE) ×1 IMPLANT
GOWN STRL REUS W/TWL LRG LVL3 (GOWN DISPOSABLE) ×1 IMPLANT
IV NS IRRIG 3000ML ARTHROMATIC (IV SOLUTION) ×1 IMPLANT
KIT ROOM TURNOVER AP CYSTO (KITS) ×1 IMPLANT
MANIFOLD NEPTUNE II (INSTRUMENTS) ×1 IMPLANT
PACK CYSTO (CUSTOM PROCEDURE TRAY) ×1 IMPLANT
PAD ARMBOARD 7.5X6 YLW CONV (MISCELLANEOUS) ×1 IMPLANT
SET IRRIGATING DISP (SET/KITS/TRAYS/PACK) ×1 IMPLANT
TOWEL OR 17X26 4PK STRL BLUE (TOWEL DISPOSABLE) ×1 IMPLANT
WATER STERILE IRR 1000ML POUR (IV SOLUTION) ×1 IMPLANT

## 2013-05-26 NOTE — Anesthesia Preprocedure Evaluation (Deleted)
Anesthesia Evaluation    Airway       Dental   Pulmonary Current Smoker,          Cardiovascular     Neuro/Psych    GI/Hepatic   Endo/Other    Renal/GU      Musculoskeletal   Abdominal   Peds  Hematology   Anesthesia Other Findings   Reproductive/Obstetrics                           Anesthesia Physical Anesthesia Plan Anesthesia Quick Evaluation

## 2013-06-24 ENCOUNTER — Encounter (HOSPITAL_COMMUNITY): Payer: Self-pay | Admitting: Pharmacy Technician

## 2013-06-25 ENCOUNTER — Encounter (HOSPITAL_COMMUNITY): Payer: Self-pay

## 2013-06-25 ENCOUNTER — Encounter (HOSPITAL_COMMUNITY)
Admission: RE | Admit: 2013-06-25 | Discharge: 2013-06-25 | Disposition: A | Discharge status: Home / Self Care | Payer: Medicare Other | Source: Ambulatory Visit | Attending: Urology | Admitting: Urology

## 2013-06-29 NOTE — Progress Notes (Signed)
  Subjective: Patient reports   Objective: Vital signs in last 24 hours:    Intake/Output from previous day:   Intake/Output this shift:    Physical Exam:  Constitutional: Vital signs reviewed. WD WN in NAD   Eyes: PERRL, No scleral icterus.   Cardiovascular: RRR Pulmonary/Chest: Normal effort Abdominal: Soft. Non-tender, non-distended, bowel sounds are normal, no masses, organomegaly, or guarding present.  Genitourinary: Extremities: No cyanosis or edema   Lab Results: No results found for this basename: HGB, HCT,  in the last 72 hours BMET No results found for this basename: NA, K, CL, CO2, GLUCOSE, BUN, CREATININE, CALCIUM,  in the last 72 hours No results found for this basename: LABPT, INR,  in the last 72 hours No results found for this basename: LABURIN,  in the last 72 hours Results for orders placed during the hospital encounter of 05/22/13  SURGICAL PCR SCREEN     Status: None   Collection Time    05/22/13 12:54 PM      Result Value Ref Range Status   MRSA, PCR NEGATIVE  NEGATIVE Final   Staphylococcus aureus NEGATIVE  NEGATIVE Final   Comment:            The Xpert SA Assay (FDA     approved for NASAL specimens     in patients over 3 years of age),     is one component of     a comprehensive surveillance     program.  Test performance has     been validated by [employer] for patients greater     than or equal to 66 year old.     It is not intended     to diagnose infection nor to     guide or monitor treatment.    Studies/Results: No results found.  Assessment/Plan:   No change in previous history and physical. He has become more symptomatic. He has a urethral stricture. I am going to scope him under anesthesia. I could not do this in the office under local anesthesia. Procedure has been explained to him and he wants me to proceed. I will do cystoscopy.

## 2013-06-30 ENCOUNTER — Ambulatory Visit (HOSPITAL_COMMUNITY)
Admission: RE | Admit: 2013-06-30 | Discharge: 2013-06-30 | Disposition: A | Discharge status: Home / Self Care | Payer: Medicare Other | Source: Ambulatory Visit | Attending: Urology | Admitting: Urology

## 2013-06-30 ENCOUNTER — Encounter (HOSPITAL_COMMUNITY): Payer: Self-pay | Admitting: *Deleted

## 2013-06-30 ENCOUNTER — Encounter (HOSPITAL_COMMUNITY)
Admission: RE | Disposition: A | Discharge status: Home / Self Care | Payer: Self-pay | Source: Ambulatory Visit | Attending: Urology

## 2013-06-30 ENCOUNTER — Encounter (HOSPITAL_COMMUNITY): Payer: Medicare Other | Admitting: Anesthesiology

## 2013-06-30 ENCOUNTER — Ambulatory Visit (HOSPITAL_COMMUNITY): Payer: Medicare Other | Admitting: Anesthesiology

## 2013-06-30 DIAGNOSIS — N35919 Unspecified urethral stricture, male, unspecified site: Secondary | ICD-10-CM | POA: Insufficient documentation

## 2013-06-30 DIAGNOSIS — Z8711 Personal history of peptic ulcer disease: Secondary | ICD-10-CM | POA: Insufficient documentation

## 2013-06-30 DIAGNOSIS — F17200 Nicotine dependence, unspecified, uncomplicated: Secondary | ICD-10-CM | POA: Insufficient documentation

## 2013-06-30 HISTORY — PX: RECTAL EXAM UNDER ANESTHESIA: SHX6399

## 2013-06-30 HISTORY — PX: URETHROTOMY: SHX1083

## 2013-06-30 HISTORY — PX: CYSTOSCOPY WITH URETHRAL DILATATION: SHX5125

## 2013-06-30 SURGERY — CYSTOSCOPY, WITH URETHRAL DILATION
Anesthesia: Monitor Anesthesia Care

## 2013-06-30 MED ORDER — FENTANYL CITRATE 0.05 MG/ML IJ SOLN
INTRAMUSCULAR | Status: AC
  Filled 2013-06-30: qty 2

## 2013-06-30 MED ORDER — CIPROFLOXACIN HCL 250 MG PO TABS: 250.0000 mg | ORAL_TABLET | Freq: Two times a day (BID) | ORAL | Status: DC

## 2013-06-30 MED ORDER — FENTANYL CITRATE 0.05 MG/ML IJ SOLN
INTRAMUSCULAR | Status: DC | PRN
Start: 2013-06-30 — End: 2013-06-30
  Administered 2013-06-30 (×2): 25 ug via INTRAVENOUS
  Administered 2013-06-30: 50 ug via INTRAVENOUS

## 2013-06-30 MED ORDER — PROPOFOL 10 MG/ML IV BOLUS
INTRAVENOUS | Status: AC
  Filled 2013-06-30: qty 20

## 2013-06-30 MED ORDER — MIDAZOLAM HCL 5 MG/5ML IJ SOLN
INTRAMUSCULAR | Status: DC | PRN
  Administered 2013-06-30 (×2): 1 mg via INTRAVENOUS

## 2013-06-30 MED ORDER — ONDANSETRON HCL 4 MG/2ML IJ SOLN
4.0000 mg | Freq: Once | INTRAMUSCULAR | Status: DC | PRN
Start: 2013-06-30 — End: 2013-06-30

## 2013-06-30 MED ORDER — OXYCODONE-ACETAMINOPHEN 7.5-325 MG PO TABS: 1.0000 | ORAL_TABLET | Freq: Four times a day (QID) | ORAL | Status: DC | PRN

## 2013-06-30 MED ORDER — FENTANYL CITRATE 0.05 MG/ML IJ SOLN: 25.0000 ug | INTRAMUSCULAR | Status: DC | PRN

## 2013-06-30 MED ORDER — LACTATED RINGERS IV SOLN
INTRAVENOUS | Status: DC | PRN
  Administered 2013-06-30: 09:00:00 via INTRAVENOUS

## 2013-06-30 MED ORDER — LACTATED RINGERS IV SOLN
INTRAVENOUS | Status: DC
  Administered 2013-06-30: 1000 mL/h via INTRAVENOUS

## 2013-06-30 MED ORDER — STERILE WATER FOR IRRIGATION IR SOLN
Status: DC | PRN
  Administered 2013-06-30: 1000 mL

## 2013-06-30 MED ORDER — LIDOCAINE HCL 2 % EX GEL
CUTANEOUS | Status: AC
  Filled 2013-06-30: qty 10

## 2013-06-30 MED ORDER — MIDAZOLAM HCL 2 MG/2ML IJ SOLN
INTRAMUSCULAR | Status: AC
  Filled 2013-06-30: qty 2

## 2013-06-30 MED ORDER — LIDOCAINE HCL (PF) 1 % IJ SOLN
INTRAMUSCULAR | Status: AC
  Filled 2013-06-30: qty 5

## 2013-06-30 MED ORDER — PROPOFOL INFUSION 10 MG/ML OPTIME
INTRAVENOUS | Status: DC | PRN
  Administered 2013-06-30: 75 ug/kg/min via INTRAVENOUS
  Administered 2013-06-30: 11:00:00 via INTRAVENOUS

## 2013-06-30 MED ORDER — MIDAZOLAM HCL 2 MG/2ML IJ SOLN
1.0000 mg | INTRAMUSCULAR | Status: DC | PRN
  Administered 2013-06-30: 2 mg via INTRAVENOUS

## 2013-06-30 MED ORDER — LIDOCAINE HCL 2 % EX GEL
CUTANEOUS | Status: DC | PRN
  Administered 2013-06-30: 1 via URETHRAL

## 2013-06-30 MED ORDER — FENTANYL CITRATE 0.05 MG/ML IJ SOLN
25.0000 ug | INTRAMUSCULAR | Status: AC
  Administered 2013-06-30 (×2): 25 ug via INTRAVENOUS
  Filled 2013-06-30: qty 2

## 2013-06-30 MED ORDER — SODIUM CHLORIDE 0.9 % IR SOLN
Status: DC | PRN
  Administered 2013-06-30: 3000 mL

## 2013-06-30 MED ORDER — LIDOCAINE HCL (CARDIAC) 10 MG/ML IV SOLN
INTRAVENOUS | Status: DC | PRN
  Administered 2013-06-30: 50 mg via INTRAVENOUS

## 2013-06-30 MED ORDER — GLYCOPYRROLATE 0.2 MG/ML IJ SOLN
0.2000 mg | Freq: Once | INTRAMUSCULAR | Status: AC
  Administered 2013-06-30: 0.2 mg via INTRAVENOUS
  Filled 2013-06-30: qty 1

## 2013-06-30 MED ORDER — ONDANSETRON HCL 4 MG/2ML IJ SOLN
4.0000 mg | Freq: Once | INTRAMUSCULAR | Status: AC
  Administered 2013-06-30: 4 mg via INTRAVENOUS
  Filled 2013-06-30: qty 2

## 2013-06-30 SURGICAL SUPPLY — 24 items
BAG DRAIN URO TABLE W/ADPT NS (DRAPE) ×4 IMPLANT
BAG DRN 8 ADPR NS SKTRN CSTL (DRAPE) ×2
BAG HAMPER (MISCELLANEOUS) ×4 IMPLANT
BAG URINE DRAINAGE (UROLOGICAL SUPPLIES) ×3 IMPLANT
CATH FOLEY 2WAY SLVR  5CC 18FR (CATHETERS) ×2
CATH FOLEY 2WAY SLVR 5CC 18FR (CATHETERS) ×1 IMPLANT
CATH URET WHISTLE 4FR (CATHETERS) ×3 IMPLANT
CLOTH BEACON ORANGE TIMEOUT ST (SAFETY) ×4 IMPLANT
GLOVE BIO SURGEON STRL SZ7 (GLOVE) ×4 IMPLANT
GLOVE BIOGEL PI IND STRL 7.0 (GLOVE) ×1 IMPLANT
GLOVE BIOGEL PI INDICATOR 7.0 (GLOVE) ×2
GLOVE ECLIPSE 6.5 STRL STRAW (GLOVE) ×3 IMPLANT
GLOVE EXAM NITRILE MD LF STRL (GLOVE) ×3 IMPLANT
GOWN STRL REUS W/TWL LRG LVL3 (GOWN DISPOSABLE) ×4 IMPLANT
IV NS IRRIG 3000ML ARTHROMATIC (IV SOLUTION) ×4 IMPLANT
KIT ROOM TURNOVER AP CYSTO (KITS) ×4 IMPLANT
KNIFE DISP COLD (ELECTROSURGICAL) ×3 IMPLANT
MANIFOLD NEPTUNE II (INSTRUMENTS) ×4 IMPLANT
PACK CYSTO (CUSTOM PROCEDURE TRAY) ×4 IMPLANT
PAD ARMBOARD 7.5X6 YLW CONV (MISCELLANEOUS) ×4 IMPLANT
SET IRRIGATING DISP (SET/KITS/TRAYS/PACK) ×4 IMPLANT
SYRINGE IRR TOOMEY STRL 70CC (SYRINGE) IMPLANT
TOWEL OR 17X26 4PK STRL BLUE (TOWEL DISPOSABLE) ×4 IMPLANT
WATER STERILE IRR 1000ML POUR (IV SOLUTION) ×4 IMPLANT

## 2013-06-30 NOTE — Anesthesia Preprocedure Evaluation (Signed)
Anesthesia Evaluation  Patient identified by MRN, date of birth, ID band Patient awake    Reviewed: Allergy & Precautions, H&P , NPO status , Patient's Chart, lab work & pertinent test results  Airway Mallampati: II TM Distance: >3 FB     Dental  (+) Lower Dentures, Upper Dentures   Pulmonary COPDCurrent Smoker,  breath sounds clear to auscultation        Cardiovascular negative cardio ROS  Rhythm:Regular Rate:Normal     Neuro/Psych    GI/Hepatic PUD,   Endo/Other    Renal/GU      Musculoskeletal   Abdominal   Peds  Hematology   Anesthesia Other Findings   Reproductive/Obstetrics                           Anesthesia Physical Anesthesia Plan  ASA: III  Anesthesia Plan: MAC   Post-op Pain Management:    Induction: Intravenous  Airway Management Planned: Simple Face Mask  Additional Equipment:   Intra-op Plan:   Post-operative Plan:   Informed Consent: I have reviewed the patients History and Physical, chart, labs and discussed the procedure including the risks, benefits and alternatives for the proposed anesthesia with the patient or authorized representative who has indicated his/her understanding and acceptance.     Plan Discussed with:   Anesthesia Plan Comments:         Anesthesia Quick Evaluation

## 2013-06-30 NOTE — Brief Op Note (Signed)
06/30/2013  11:26 AM  PATIENT:  Jonathan Goodwin  68 y.o. male  PRE-OPERATIVE DIAGNOSIS:  urethral stenosis  POST-OPERATIVE DIAGNOSIS:  urethral stenosis  PROCEDURE:  Procedure(s): CYSTOSCOPY WITH URETHRAL DILATATION (N/A) RECTAL EXAM UNDER ANESTHESIA (N/A)  SURGEON:  Surgeon(s) and Role:    * Marissa Nestle, MD - Primary  PHYSICIAN ASSISTANT:   ASSISTANTS: none   ANESTHESIA:   MAC  EBL:  Total I/O In: 200 [I.V.:200] Out: -   BLOOD ADMINISTERED:none  DRAINS: Urinary Catheter (Foley)   LOCAL MEDICATIONS USED:  NONE  SPECIMEN:  No Specimen  DISPOSITION OF SPECIMEN:  N/A  COUNTS:  YES  TOURNIQUET:  * No tourniquets in log *  DICTATION: .Other Dictation: Dictation Number dictation [id number]  PLAN OF CARE: Discharge to home after PACU  PATIENT DISPOSITION:  PACU - hemodynamically stable.   Delay start of Pharmacological VTE agent (>24hrs) due to surgical blood loss or risk of bleeding:

## 2013-06-30 NOTE — Discharge Instructions (Signed)
Call if fever or bleeding.  Cystoscopy, Care After Refer to this sheet in the next few weeks. These instructions provide you with information on caring for yourself after your procedure. Your caregiver may also give you more specific instructions. Your treatment has been planned according to current medical practices, but problems sometimes occur. Call your caregiver if you have any problems or questions after your procedure. HOME CARE INSTRUCTIONS  Things you can do to ease any discomfort after your procedure include:  Drinking enough water and fluids to keep your urine clear or pale yellow.  Taking a warm bath to relieve any burning feelings. SEEK IMMEDIATE MEDICAL CARE IF:   You have an increase in blood in your urine.  You notice blood clots in your urine.  You have difficulty passing urine.  You have the chills.  You have abdominal pain.  You have a fever or persistent symptoms for more than 2 3 days.  You have a fever and your symptoms suddenly get worse. MAKE SURE YOU:   Understand these instructions.  Will watch your condition.  Will get help right away if you are not doing well or get worse. Document Released: 08/18/2004 Document Revised: 10/01/2012 Document Reviewed: 07/23/2011 [facility] Patient Information 2014 [place].

## 2013-06-30 NOTE — Anesthesia Postprocedure Evaluation (Signed)
  Anesthesia Post-op Note  Patient: Jonathan Goodwin  Procedure(s) Performed: Procedure(s): CYSTOSCOPY WITH URETHRAL DILATATION (N/A) RECTAL EXAM UNDER ANESTHESIA (N/A)  Patient Location: PACU  Anesthesia Type:MAC  Level of Consciousness: awake, alert , oriented and patient cooperative  Airway and Oxygen Therapy: Patient Spontanous Breathing and Patient connected to nasal cannula oxygen  Post-op Pain: mild  Post-op Assessment: Post-op Vital signs reviewed, Patient's Cardiovascular Status Stable, Respiratory Function Stable, Patent Airway, No signs of Nausea or vomiting and Pain level controlled  Post-op Vital Signs: Reviewed and stable  Last Vitals:  Filed Vitals:   06/30/13 1134  BP: 128/84  Pulse: 70  Temp: 36.5 C  Resp: 22    Complications: No apparent anesthesia complications

## 2013-06-30 NOTE — Progress Notes (Signed)
No change in H&P on reexamination. 

## 2013-06-30 NOTE — Anesthesia Procedure Notes (Signed)
Procedure Name: MAC Date/Time: 06/30/2013 10:33 AM Performed by: Andree Elk, Darcey Demma A Pre-anesthesia Checklist: Patient identified, Timeout performed, Emergency Drugs available, Suction available and Patient being monitored Oxygen Delivery Method: Simple face mask

## 2013-06-30 NOTE — Transfer of Care (Signed)
Immediate Anesthesia Transfer of Care Note  Patient: Jonathan Goodwin  Procedure(s) Performed: Procedure(s): CYSTOSCOPY WITH URETHRAL DILATATION (N/A) RECTAL EXAM UNDER ANESTHESIA (N/A)  Patient Location: PACU  Anesthesia Type:MAC  Level of Consciousness: awake, alert , oriented and patient cooperative  Airway & Oxygen Therapy: Patient Spontanous Breathing and Patient connected to nasal cannula oxygen  Post-op Assessment: Report given to PACU RN and Post -op Vital signs reviewed and stable  Post vital signs: Reviewed and stable  Complications: No apparent anesthesia complications

## 2013-06-30 NOTE — Progress Notes (Signed)
Patient refusing to go home with Foley catheter.  I told pt and wife that he could get a blood clot and he would get distended and unable to pee/void.  Pt stated "If you don't take it out I will cut it and pull it out"   Dr Michela Pitcher notified.  OK to take foley out prior to D/C.    Patient and patient's wife instructed to come to the ED if unable to void.

## 2013-07-01 ENCOUNTER — Encounter (HOSPITAL_COMMUNITY): Payer: Self-pay | Admitting: Urology

## 2013-07-01 NOTE — Op Note (Signed)
NAME:  DEMARRIO, MENGES                 ACCOUNT NO.:  [id number]  MEDICAL RECORD NO.:  [id number]  LOCATION:  [facility]                          FACILITY:  [facility]  PHYSICIAN:  Marissa Nestle, M.D.DATE OF BIRTH:  Jan 29, 1946  DATE OF PROCEDURE: DATE OF DISCHARGE:  06/30/2013                              OPERATIVE REPORT   PREOPERATIVE DIAGNOSIS:  Rule out urethral stricture.  POSTOPERATIVE DIAGNOSIS:  Urethral stricture.  PROCEDURE:  Cysto dilation, Foley catheter insertion, optical urethrotomy.  ANESTHESIA:  IV sedation, MAC.  DESCRIPTION OF PROCEDURE:  The patient was placed in lithotomy position. After usual prep and drape, he was given IV MAC anesthesia.  Under direct vision, I introduced #22 cystoscope, it goes up to the level of the urethral stricture.  I could not advance the scope beyond that and it is located in the bulbar urethra, and then I tried #17 scope, nothing will go through it.  I tried to dilate him with Leander Rams sounds.  I could not introduce #18 sound into the bladder, so I decided to put a #4 Whistle-tip catheter which was introduced, went into the bladder, and after checking the position of the catheter making sure that it was in the bladder, then, I introduced #17 scope followed it up to the bladder. I was able to get into the bladder with that.  With cystoscope bladder was inspected, looks normal.  Both ureteral orifices located at normal side with clear efflux.  Cystoscope was removed.  He does have enlarged prostate with visual obstruction of the lateral causing with the lateral lobes.  Cystoscope was removed.  Then, I dilated him again with Leander Rams sounds.  I was able to get up to #26 sounds without any difficulty.  Once it was done then I wanted to use optical urethrotome, which was introduced along over the catheter which is in the bladder and went to the level of the stricture and there was a little bit mucosal lining which was scarred and I had to cut  that with the optical urethrotome and lose using using cold knife, now the bulbar urethra looks wide open, and cystoscope was removed.  I dilated it to 26-French, again I decided to leave a Foley catheter, so through the optical urethrotome, I was able to insert #18 Foley catheter into the bladder. The patient left the operating room in satisfactory condition.  At the end, I did a digital rectal exam.  I can feel something in the mucosa of the rectum in the midline anteriorly.  I was confusing it with the Foley catheter, but it was just in the mucosa.  It is a linear type of  lesion.  Prostate is about 40 g smooth and firm.  Cystoscope was removed.  I have removed all the instruments.  Patient left the operating room in satisfactory condition.     Marissa Nestle, M.D.     MIJ/MEDQ  D:  06/30/2013  T:  07/01/2013  Job:  [id number]

## 2013-07-10 ENCOUNTER — Emergency Department (HOSPITAL_COMMUNITY): Payer: Medicare Other

## 2013-07-10 ENCOUNTER — Inpatient Hospital Stay (HOSPITAL_COMMUNITY)
Admission: EM | Admit: 2013-07-10 | Discharge: 2013-07-13 | DRG: 175 | Disposition: A | Discharge status: Home / Self Care | Payer: Medicare Other | Attending: Pulmonary Disease | Admitting: Pulmonary Disease

## 2013-07-10 ENCOUNTER — Encounter (HOSPITAL_COMMUNITY): Payer: Self-pay | Admitting: Emergency Medicine

## 2013-07-10 DIAGNOSIS — I2699 Other pulmonary embolism without acute cor pulmonale: Secondary | ICD-10-CM

## 2013-07-10 DIAGNOSIS — F17200 Nicotine dependence, unspecified, uncomplicated: Secondary | ICD-10-CM | POA: Diagnosis present

## 2013-07-10 DIAGNOSIS — M129 Arthropathy, unspecified: Secondary | ICD-10-CM | POA: Diagnosis present

## 2013-07-10 DIAGNOSIS — N179 Acute kidney failure, unspecified: Secondary | ICD-10-CM | POA: Diagnosis present

## 2013-07-10 DIAGNOSIS — I82409 Acute embolism and thrombosis of unspecified deep veins of unspecified lower extremity: Secondary | ICD-10-CM

## 2013-07-10 DIAGNOSIS — J4489 Other specified chronic obstructive pulmonary disease: Secondary | ICD-10-CM | POA: Diagnosis present

## 2013-07-10 DIAGNOSIS — Z8546 Personal history of malignant neoplasm of prostate: Secondary | ICD-10-CM

## 2013-07-10 DIAGNOSIS — J189 Pneumonia, unspecified organism: Secondary | ICD-10-CM

## 2013-07-10 DIAGNOSIS — Z22322 Carrier or suspected carrier of Methicillin resistant Staphylococcus aureus: Secondary | ICD-10-CM

## 2013-07-10 DIAGNOSIS — R911 Solitary pulmonary nodule: Secondary | ICD-10-CM | POA: Diagnosis present

## 2013-07-10 DIAGNOSIS — J449 Chronic obstructive pulmonary disease, unspecified: Secondary | ICD-10-CM | POA: Diagnosis present

## 2013-07-10 DIAGNOSIS — R918 Other nonspecific abnormal finding of lung field: Secondary | ICD-10-CM

## 2013-07-10 DIAGNOSIS — Z7901 Long term (current) use of anticoagulants: Secondary | ICD-10-CM

## 2013-07-10 DIAGNOSIS — H3530 Unspecified macular degeneration: Secondary | ICD-10-CM | POA: Diagnosis present

## 2013-07-10 DIAGNOSIS — H9190 Unspecified hearing loss, unspecified ear: Secondary | ICD-10-CM | POA: Diagnosis present

## 2013-07-10 HISTORY — DX: Acute embolism and thrombosis of unspecified deep veins of unspecified lower extremity: I82.409

## 2013-07-10 HISTORY — DX: Other pulmonary embolism without acute cor pulmonale: I26.99

## 2013-07-10 LAB — URINALYSIS, ROUTINE W REFLEX MICROSCOPIC
Bilirubin Urine: NEGATIVE
Glucose, UA: NEGATIVE mg/dL
KETONES UR: NEGATIVE mg/dL
Leukocytes, UA: NEGATIVE
Nitrite: NEGATIVE
PROTEIN: 30 mg/dL — AB
Specific Gravity, Urine: 1.02 (ref 1.005–1.030)
Urobilinogen, UA: 0.2 mg/dL (ref 0.0–1.0)
pH: 6 (ref 5.0–8.0)

## 2013-07-10 LAB — PROTIME-INR
INR: 1.16 (ref 0.00–1.49)
PROTHROMBIN TIME: 14.6 s (ref 11.6–15.2)

## 2013-07-10 LAB — CBC WITH DIFFERENTIAL/PLATELET
BASOS ABS: 0 10*3/uL (ref 0.0–0.1)
Basophils Relative: 0 % (ref 0–1)
Eosinophils Absolute: 0.2 10*3/uL (ref 0.0–0.7)
Eosinophils Relative: 2 % (ref 0–5)
HCT: 49.4 % (ref 39.0–52.0)
Hemoglobin: 16.3 g/dL (ref 13.0–17.0)
LYMPHS PCT: 6 % — AB (ref 12–46)
Lymphs Abs: 1 10*3/uL (ref 0.7–4.0)
MCH: 29.6 pg (ref 26.0–34.0)
MCHC: 33 g/dL (ref 30.0–36.0)
MCV: 89.7 fL (ref 78.0–100.0)
Monocytes Absolute: 1 10*3/uL (ref 0.1–1.0)
Monocytes Relative: 7 % (ref 3–12)
NEUTROS ABS: 12.6 10*3/uL — AB (ref 1.7–7.7)
Neutrophils Relative %: 85 % — ABNORMAL HIGH (ref 43–77)
PLATELETS: 192 10*3/uL (ref 150–400)
RBC: 5.51 MIL/uL (ref 4.22–5.81)
RDW: 14.7 % (ref 11.5–15.5)
WBC: 14.8 10*3/uL — AB (ref 4.0–10.5)

## 2013-07-10 LAB — MRSA PCR SCREENING: MRSA BY PCR: POSITIVE — AB

## 2013-07-10 LAB — URINE MICROSCOPIC-ADD ON

## 2013-07-10 LAB — TROPONIN I

## 2013-07-10 LAB — PRO B NATRIURETIC PEPTIDE: Pro B Natriuretic peptide (BNP): 1349 pg/mL — ABNORMAL HIGH (ref 0–125)

## 2013-07-10 LAB — BASIC METABOLIC PANEL
BUN: 20 mg/dL (ref 6–23)
CHLORIDE: 98 meq/L (ref 96–112)
CO2: 27 meq/L (ref 19–32)
Calcium: 9.5 mg/dL (ref 8.4–10.5)
Creatinine, Ser: 1.45 mg/dL — ABNORMAL HIGH (ref 0.50–1.35)
GFR calc Af Amer: 55 mL/min — ABNORMAL LOW (ref >90)
GFR calc non Af Amer: 48 mL/min — ABNORMAL LOW (ref >90)
Glucose, Bld: 131 mg/dL — ABNORMAL HIGH (ref 70–99)
Potassium: 4.7 mEq/L (ref 3.7–5.3)
SODIUM: 140 meq/L (ref 137–147)

## 2013-07-10 LAB — GLUCOSE, CAPILLARY: GLUCOSE-CAPILLARY: 169 mg/dL — AB (ref 70–99)

## 2013-07-10 LAB — APTT: aPTT: 31 seconds (ref 24–37)

## 2013-07-10 MED ORDER — METHYLPREDNISOLONE SODIUM SUCC 125 MG IJ SOLR
125.0000 mg | Freq: Once | INTRAMUSCULAR | Status: AC
  Administered 2013-07-10: 125 mg via INTRAVENOUS
  Filled 2013-07-10: qty 2

## 2013-07-10 MED ORDER — IPRATROPIUM BROMIDE 0.02 % IN SOLN
0.5000 mg | Freq: Once | RESPIRATORY_TRACT | Status: DC
Start: 2013-07-10 — End: 2013-07-10

## 2013-07-10 MED ORDER — CHLORHEXIDINE GLUCONATE CLOTH 2 % EX PADS
6.0000 | MEDICATED_PAD | Freq: Every morning | CUTANEOUS | Status: DC
  Administered 2013-07-11 – 2013-07-13 (×3): 6 via TOPICAL

## 2013-07-10 MED ORDER — BUDESONIDE-FORMOTEROL FUMARATE 160-4.5 MCG/ACT IN AERO
2.0000 | INHALATION_SPRAY | Freq: Two times a day (BID) | RESPIRATORY_TRACT | Status: DC
  Administered 2013-07-10 – 2013-07-12 (×5): 2 via RESPIRATORY_TRACT
  Filled 2013-07-10 (×2): qty 6

## 2013-07-10 MED ORDER — PIPERACILLIN-TAZOBACTAM 3.375 G IVPB
3.3750 g | Freq: Three times a day (TID) | INTRAVENOUS | Status: DC
  Administered 2013-07-10 – 2013-07-12 (×5): 3.375 g via INTRAVENOUS
  Filled 2013-07-10 (×7): qty 50

## 2013-07-10 MED ORDER — ALBUTEROL SULFATE (2.5 MG/3ML) 0.083% IN NEBU: 2.5000 mg | INHALATION_SOLUTION | RESPIRATORY_TRACT | Status: DC | PRN

## 2013-07-10 MED ORDER — BIOTENE DRY MOUTH MT LIQD
15.0000 mL | Freq: Two times a day (BID) | OROMUCOSAL | Status: DC
  Administered 2013-07-10 – 2013-07-12 (×5): 15 mL via OROMUCOSAL

## 2013-07-10 MED ORDER — PIPERACILLIN-TAZOBACTAM 3.375 G IVPB 30 MIN
3.3750 g | Freq: Once | INTRAVENOUS | Status: AC
  Administered 2013-07-10: 3.375 g via INTRAVENOUS
  Filled 2013-07-10 (×2): qty 50

## 2013-07-10 MED ORDER — HEPARIN (PORCINE) IN NACL 100-0.45 UNIT/ML-% IJ SOLN
1400.0000 [IU]/h | INTRAMUSCULAR | Status: AC
  Administered 2013-07-10 – 2013-07-11 (×2): 1400 [IU]/h via INTRAVENOUS
  Filled 2013-07-10 (×2): qty 250

## 2013-07-10 MED ORDER — ALBUTEROL SULFATE (2.5 MG/3ML) 0.083% IN NEBU
2.5000 mg | INHALATION_SOLUTION | Freq: Once | RESPIRATORY_TRACT | Status: AC
  Administered 2013-07-10: 2.5 mg via RESPIRATORY_TRACT
  Filled 2013-07-10: qty 3

## 2013-07-10 MED ORDER — FAMOTIDINE IN NACL 20-0.9 MG/50ML-% IV SOLN
20.0000 mg | Freq: Two times a day (BID) | INTRAVENOUS | Status: DC
  Administered 2013-07-10 – 2013-07-11 (×2): 20 mg via INTRAVENOUS
  Filled 2013-07-10 (×3): qty 50

## 2013-07-10 MED ORDER — IPRATROPIUM-ALBUTEROL 0.5-2.5 (3) MG/3ML IN SOLN: 3.0000 mL | RESPIRATORY_TRACT | Status: DC

## 2013-07-10 MED ORDER — SODIUM CHLORIDE 0.9 % IV SOLN: 250.0000 mL | INTRAVENOUS | Status: DC | PRN

## 2013-07-10 MED ORDER — ALBUTEROL SULFATE (2.5 MG/3ML) 0.083% IN NEBU: 5.0000 mg | INHALATION_SOLUTION | Freq: Once | RESPIRATORY_TRACT | Status: DC

## 2013-07-10 MED ORDER — IOHEXOL 350 MG/ML SOLN
100.0000 mL | Freq: Once | INTRAVENOUS | Status: AC | PRN
  Administered 2013-07-10: 100 mL via INTRAVENOUS

## 2013-07-10 MED ORDER — VANCOMYCIN HCL IN DEXTROSE 750-5 MG/150ML-% IV SOLN
750.0000 mg | Freq: Two times a day (BID) | INTRAVENOUS | Status: DC
  Administered 2013-07-10 – 2013-07-12 (×4): 750 mg via INTRAVENOUS
  Filled 2013-07-10 (×5): qty 150

## 2013-07-10 MED ORDER — VANCOMYCIN HCL IN DEXTROSE 1-5 GM/200ML-% IV SOLN
1000.0000 mg | Freq: Once | INTRAVENOUS | Status: AC
  Administered 2013-07-10: 1000 mg via INTRAVENOUS
  Filled 2013-07-10: qty 200

## 2013-07-10 MED ORDER — IPRATROPIUM-ALBUTEROL 0.5-2.5 (3) MG/3ML IN SOLN
3.0000 mL | Freq: Once | RESPIRATORY_TRACT | Status: AC
  Administered 2013-07-10: 3 mL via RESPIRATORY_TRACT
  Filled 2013-07-10: qty 3

## 2013-07-10 MED ORDER — PNEUMOCOCCAL VAC POLYVALENT 25 MCG/0.5ML IJ INJ
0.5000 mL | INJECTION | INTRAMUSCULAR | Status: AC
  Administered 2013-07-11: 0.5 mL via INTRAMUSCULAR
  Filled 2013-07-10: qty 0.5

## 2013-07-10 MED ORDER — SODIUM CHLORIDE 0.9 % IV SOLN
INTRAVENOUS | Status: DC
  Administered 2013-07-10 – 2013-07-11 (×2): via INTRAVENOUS

## 2013-07-10 MED ORDER — IPRATROPIUM-ALBUTEROL 0.5-2.5 (3) MG/3ML IN SOLN
3.0000 mL | RESPIRATORY_TRACT | Status: DC
  Administered 2013-07-10 – 2013-07-11 (×3): 3 mL via RESPIRATORY_TRACT
  Filled 2013-07-10 (×3): qty 3

## 2013-07-10 MED ORDER — ACETAMINOPHEN 325 MG PO TABS: 650.0000 mg | ORAL_TABLET | Freq: Four times a day (QID) | ORAL | Status: DC | PRN

## 2013-07-10 MED ORDER — MUPIROCIN 2 % EX OINT
1.0000 "application " | TOPICAL_OINTMENT | Freq: Two times a day (BID) | CUTANEOUS | Status: DC
  Administered 2013-07-11 – 2013-07-13 (×6): 1 via NASAL
  Filled 2013-07-10 (×2): qty 22

## 2013-07-10 MED ORDER — HEPARIN BOLUS VIA INFUSION
5000.0000 [IU] | Freq: Once | INTRAVENOUS | Status: AC
  Administered 2013-07-10: 5000 [IU] via INTRAVENOUS

## 2013-07-10 NOTE — ED Notes (Signed)
Cystoscopy last week, progressively worsen, dx bilateral PNA, done with antibiotic.

## 2013-07-10 NOTE — ED Notes (Signed)
Both BC obtained prior to antibiotics given

## 2013-07-10 NOTE — ED Notes (Signed)
RT in for treatment

## 2013-07-10 NOTE — Progress Notes (Signed)
ANTIBIOTIC CONSULT NOTE - INITIAL  Pharmacy Consult for vanc/zosyn Indication: pneumonia  No Known Allergies  Patient Measurements: Height: 6\' 1"  (185.4 cm) Weight: 193 lb 9 oz (87.8 kg) IBW/kg (Calculated) : 79.9 Adjusted Body Weight:   Vital Signs: Temp: 97.3 F (36.3 C) (05/29 2000) Temp src: Oral (05/29 2000) BP: 100/86 mmHg (05/29 2100) Pulse Rate: 71 (05/29 2100) Intake/Output from previous day:   Intake/Output from this shift: Total I/O In: 686.3 [P.O.:480; I.V.:206.3] Out: -   Labs:  Recent Labs  07/10/13 1014  WBC 14.8*  HGB 16.3  PLT 192  CREATININE 1.45*   Estimated Creatinine Clearance: 54.3 ml/min (by C-G formula based on Cr of 1.45). No results found for this basename: VANCOTROUGH, VANCOPEAK, VANCORANDOM, GENTTROUGH, GENTPEAK, GENTRANDOM, TOBRATROUGH, TOBRAPEAK, TOBRARND, AMIKACINPEAK, AMIKACINTROU, AMIKACIN,  in the last 72 hours   Microbiology: No results found for this or any previous visit (from the past 720 hour(s)).  Medical History: Past Medical History  Diagnosis Date  . H/O renal calculi   . Urethral stricture   . Wears hearing aid     right ear  . Hearing impaired person, right   . Arthritis   . Prostate cancer 10/24/07    radiation therapy  . Macular degeneration   . Ulcerative colitis   . History of kidney stones     Medications:  Scheduled:  . antiseptic oral rinse  15 mL Mouth Rinse BID  . budesonide-formoterol  2 puff Inhalation BID  . famotidine (PEPCID) IV  20 mg Intravenous Q12H  . ipratropium-albuterol  3 mL Nebulization Q4H   Infusions:  . sodium chloride Stopped (07/10/13 2047)  . heparin 1,400 Units/hr (07/10/13 2000)   Assessment: 68 yo who was tx from [facility] today. He was started on IV heparin over there for DVT and bilateral PE. Now starting on vanc/zosyn to cover for PNA. Pt has received a dose of each at [facility].   Goal of Therapy:  Vancomycin trough level 15-20 mcg/ml  Plan:   Vanc 750mg  IV q12 Zosyn  3.375g IV q8 Cont heparin at 1400 units/hr F/u with level tonight

## 2013-07-10 NOTE — H&P (Signed)
PULMONARY / CRITICAL CARE MEDICINE   Name: Jonathan Goodwin MRN: [id number] DOB: [birth year]    ADMISSION DATE:  07/10/2013  PRIMARY SERVICE: PCCM  CHIEF COMPLAINT:  PE  BRIEF PATIENT DESCRIPTION:  68 years old male with PMH relevant for prostate cancer s/p radiation and urethral stricture post cystoscopic urethrotomy. Presents with two weeks of left LE swelling and about a week of worsening SOB and cough. CTA positive for large bilateral PE's R> L with evidence of right heart strain on CT and elevated BNP but negative troponin. Hemodynamically stable saturating 96% on 2 L [place].   SIGNIFICANT EVENTS / STUDIES:  - CTA 07/10/13: Large bilateral PE's  LINES / TUBES: - Peripheral IV's  CULTURES: - Sputum culture ordered  ANTIBIOTICS: - Zosyn - Vancomycin  HISTORY OF PRESENT ILLNESS:   68 years old male with PMH relevant for prostate cancer s/p radiation and urethral stricture post cystoscopic urethrotomy on 5/19. Patient is a current smoker with history of heavy smoking of 1 to 2 packs per day for 58 years. No history of lung cancer in the patient or family. Never diagnosed with COPD and not on any treatment. At baseline can walk about a 100 feet and then has to stop.   Presents with about two weeks of LLE pain and swelling. Later, about a week ago, developed worsening SOB, questionable fever, sputum production. Was diagnosed with PNA ans completed a course of azithromycin. Most recently he was told that he had CHF and was started on lasix which he took for the last 4 days. Went to ED [facility] and a CTA showed large bilateral PE's R>L  With evidence of right hert strain on CT and with elevated BNP but normal troponin. LE dopplers showed extensive LLE DVT's (femoral). Of note, the CTA also showed significant mediastinal adenopathy and a 8.5 mm RLL nodule. Transferred to [facility] for treatment. At the time of my examination the patient is awake, alert, oriented, hemodynamically stable and saturating 96% on  2 L . Denies CP or SOB. Feeling better. Transferred on heparin drip.   PAST MEDICAL HISTORY :  Past Medical History  Diagnosis Date  . H/O renal calculi   . Urethral stricture   . Wears hearing aid     right ear  . Hearing impaired person, right   . Arthritis   . Prostate cancer 10/24/07    radiation therapy  . Macular degeneration   . Ulcerative colitis   . History of kidney stones    Past Surgical History  Procedure Laterality Date  . Knee arthroscopy  1993    right  . Appendectomy  2010    [facility], Dr. Romona Curls  . Shoulder surgery      left, for chronic dislocation  . Cystoscopy  2010    [facility], Dr. Maryland Pink  . Urethrotomy  06/12/2011    Procedure: CYSTOSCOPY/URETHROTOMY;  Surgeon: Marissa Nestle, MD;  Location: [facility];  Service: Urology;  Laterality: N/A;  optical urethrotomy  . Cataract extraction w/phaco Left 01/15/2013    Procedure: CATARACT EXTRACTION PHACO AND INTRAOCULAR LENS PLACEMENT (IOC);  Surgeon: Tonny Branch, MD;  Location: [facility];  Service: Ophthalmology;  Laterality: Left;  CDE:22.42  . Cystoscopy with urethral dilatation N/A 06/30/2013    Procedure: CYSTOSCOPY WITH URETHRAL DILATATION;  Surgeon: Marissa Nestle, MD;  Location: [facility];  Service: Urology;  Laterality: N/A;  . Rectal exam under anesthesia N/A 06/30/2013    Procedure: RECTAL EXAM UNDER ANESTHESIA;  Surgeon: Silvano Rusk  Michela Pitcher, MD;  Location: [facility];  Service: Urology;  Laterality: N/A;  . Urethrotomy N/A 06/30/2013    Procedure: CYSTOSCOPY/URETHROTOMY;  Surgeon: Marissa Nestle, MD;  Location: [facility];  Service: Urology;  Laterality: N/A;  . Cystoscopy 2015     Prior to Admission medications   Medication Sig Start Date End Date Taking? Authorizing Provider  hydrochlorothiazide (HYDRODIURIL) 12.5 MG tablet Take 12.5 mg by mouth daily. 07/07/13  Yes Historical Provider, MD   No Known Allergies  FAMILY HISTORY:  History reviewed. No pertinent family history. SOCIAL HISTORY:  reports that he has  been smoking Cigarettes.  He has a 58 pack-year smoking history. He does not have any smokeless tobacco history on file. He reports that he drinks alcohol. He reports that he does not use illicit drugs.  REVIEW OF SYSTEMS:  All systems reviewed and found negative except for what I mentioned in the HPI.   SUBJECTIVE:   VITAL SIGNS: Temp:  [97.3 F (36.3 C)-98.5 F (36.9 C)] 97.3 F (36.3 C) (05/29 2000) Pulse Rate:  [71-98] 71 (05/29 2100) Resp:  [18-29] 26 (05/29 2100) BP: (100-137)/(72-96) 100/86 mmHg (05/29 2100) SpO2:  [91 %-96 %] 91 % (05/29 2100) Weight:  [193 lb 9 oz (87.8 kg)] 193 lb 9 oz (87.8 kg) (05/29 2000) HEMODYNAMICS:   VENTILATOR SETTINGS:   INTAKE / OUTPUT: Intake/Output     05/29 0701 - 05/30 0700   P.O. 480   I.V. (mL/kg) 306.3 (3.5)   Total Intake(mL/kg) 786.3 (9)   Net +786.3         PHYSICAL EXAMINATION: General: Pleasant male patient in no acute distress. Eyes: Anicteric sclerae. ENT: Oropharynx clear. Moist mucous membranes. No thrush Lymph: No cervical, supraclavicular, or axillary lymphadenopathy. Heart: Normal S1, S2. No murmurs, rubs, or gallops appreciated. No bruits, equal pulses. Lungs: Normal excursion, no dullness to percussion. Good air movement bilaterally, without wheezes or crackles. Normal upper airway sounds without evidence of stridor. Abdomen: Abdomen soft, non-tender and not distended, normoactive bowel sounds. No hepatosplenomegaly or masses. Musculoskeletal: No clubbing or synovitis. Mild increase in size of LLE. Skin: No rashes or lesions Neuro: No focal neurologic deficits.   LABS:  CBC  Recent Labs Lab 07/10/13 1014  WBC 14.8*  HGB 16.3  HCT 49.4  PLT 192   Coag's  Recent Labs Lab 07/10/13 1559  APTT 31  INR 1.16   BMET  Recent Labs Lab 07/10/13 1014  NA 140  K 4.7  CL 98  CO2 27  BUN 20  CREATININE 1.45*  GLUCOSE 131*   Electrolytes  Recent Labs Lab 07/10/13 1014  CALCIUM 9.5   Sepsis  Markers No results found for this basename: LATICACIDVEN, PROCALCITON, O2SATVEN,  in the last 168 hours ABG No results found for this basename: PHART, PCO2ART, PO2ART,  in the last 168 hours Liver Enzymes No results found for this basename: AST, ALT, ALKPHOS, BILITOT, ALBUMIN,  in the last 168 hours Cardiac Enzymes  Recent Labs Lab 07/10/13 1014  TROPONINI <0.30  PROBNP 1349.0*   Glucose  Recent Labs Lab 07/10/13 1821  GLUCAP 169*    Imaging Dg Chest 2 View  07/10/2013   CLINICAL DATA:  Acute respiratory distress. Severe shortness of breath. Cough. Pneumonia.  EXAM: CHEST  2 VIEW  COMPARISON:  07/02/2013  FINDINGS: Changes of COPD are again demonstrated. Asymmetric airspace disease is seen in the right middle lobe, suspicious for pneumonia superimposed on COPD. No evidence of left lung airspace disease or pleural effusion.  Heart size is normal.  IMPRESSION: Right middle lobe airspace disease, suspicious for pneumonia. Recommend chest radiographic followup in several weeks to confirm resolution.  COPD.   Electronically Signed   By: Earle Gell M.D.   On: 07/10/2013 10:30   Ct Angio Chest W/cm &/or Wo Cm  07/10/2013   CLINICAL DATA:  Respiratory distress. Shortness of breath. Left leg pain and swelling.  EXAM: CT ANGIOGRAPHY CHEST WITH CONTRAST  TECHNIQUE: Multidetector CT imaging of the chest was performed using the standard protocol during bolus administration of intravenous contrast. Multiplanar CT image reconstructions and MIPs were obtained to evaluate the vascular anatomy.  CONTRAST:  140mL OMNIPAQUE IOHEXOL 350 MG/ML SOLN  COMPARISON:  Radiography same day.  CT 06/06/2008.  FINDINGS: The study is positive for extensive bilateral pulmonary embolic disease, more massive on the right than the left. There is evidence of right ventricular strain with the right ventricular to left ventricular ratio of 1.5.  There is atherosclerosis of the aorta and of the coronary arteries. No pleural or  pericardial fluid.  In evaluating the lungs, there is considerable motion degradation. On the right, there is infiltrate in the middle lobe with volume loss consistent with pneumonia. Some patchy density is also present in the lower lobe. Just above the diaphragm in the right lower lobe, there is a nodule measuring 8.5 mm in diameter. There may be other smaller nodules but it is difficult to be certain given the motion degradation.  On the left, there is some scarring and atelectasis. Patchy infiltrate is possible. Focal nodules are difficult to accurately characterize given the degree of motion degradation. Particularly, in the peripheral left lower lobe image number 77 there is a 9.5 mm nodular shadow. On image 71 there is a 7.4 mm nodular shadow. On image 65 there is a 11 mm nodular shadow.  There are abnormal lymph nodes in the hilar and mediastinal regions. The largest nodes are at the aortopulmonary window on the left measuring 26 x 15 mm and in the left hilum measuring 26 x 17 mm. These are larger than were seen previously.  There is a background pattern of centrilobular emphysema.  Scans in the upper abdomen show chronic adrenal enlargement on the left consistent with a benign adenoma.  Review of the MIP images confirms the above findings.  IMPRESSION: Positive for acute PE with CT evidence of right heartstrain (RV/LV Ratio = 1.5) consistent with at least submassive (intermediate risk)PE. The presence of right heart strain has been associated with anincreased risk of morbidity and mortality. Consultation with [place] is recommended.  Extensive coronary artery calcification.  Abnormal density in the right middle lobe and to a lesser extent the right lower lobe that could be pneumonia. There is a 8.5 mm nodule in the right lower lobe just above the diaphragm. The possibility of interstitial spread of cancer does exist.  The pulmonary images are degraded by motion. There are other  areas that could represent nodules that are not precisely evaluated. There is worrisome nodal enlargement in the mediastinum and the hila.  Background pattern of centrilobular emphysema.  Critical Value/emergent results were called by telephone at the time of interpretation on 07/10/2013 at 3:46 PM to Dr. Rolland Porter , who verbally acknowledged these results.   Electronically Signed   By: Nelson Chimes M.D.   On: 07/10/2013 15:49   US Venous Img Lower Unilateral Left  07/10/2013   CLINICAL DATA:  Respiratory distress  EXAM: Left LOWER EXTREMITY  VENOUS DOPPLER ULTRASOUND  TECHNIQUE: Gray-scale sonography with graded compression, as well as color Doppler and duplex ultrasound were performed to evaluate the lower extremity deep venous systems from the level of the common femoral vein and including the common femoral, femoral, profunda femoral, popliteal and calf veins including the posterior tibial, peroneal and gastrocnemius veins when visible. The superficial great saphenous vein was also interrogated. Spectral Doppler was utilized to evaluate flow at rest and with distal augmentation maneuvers in the common femoral, femoral and popliteal veins.  COMPARISON:  None.  FINDINGS: Common Femoral Vein: No evidence of thrombus. Normal compressibility, respiratory phasicity and response to augmentation.  Saphenofemoral Junction: No evidence of thrombus. Normal compressibility and flow on color Doppler imaging.  Profunda Femoral Vein: No evidence of thrombus. Normal compressibility and flow on color Doppler imaging.  Femoral Vein: Deep venous thrombosis is noted.  Popliteal Vein: Deep venous thrombosis is noted.  Calf Veins: Deep venous thrombosis is noted in the posterior tibial, peroneal and anterior tibial veins.  Superficial Great Saphenous Vein: No evidence of thrombus. Normal compressibility and flow on color Doppler imaging.  Venous Reflux:  None.  Other Findings:  None.  IMPRESSION: Diffuse left leg deep venous  thrombosis.   Electronically Signed   By: Inez Catalina M.D.   On: 07/10/2013 15:10     ASSESSMENT / PLAN:  PULMONARY A: 1) Intermediate risk PE per evidence of right hert strain on CTA and elevated BNP. Normal troponin. Hemodynamically stable. Per PEITHO trial, risk of death or hemodynamic decompensation was 2.6% on TPA group vs 5.6% on placebo group but risk of major extracranial bleeding was 6.3% on TPA group vs 1.2 on placebo group and risk of intracranial bleed was 12. On TPA group vs 0.2% on placebo group. With this evidence, at this point the risk of major bleeding out weight the potential benefit of TPA and the main treatment for now shoud be conventional anticoagulation. PE seems to be unprovoked except for some long distance travel (7hrs) about a month ago and recent cystoscopy but not in bed for a long period of time. Patient with mediastinal adenopathy and a RLL nodule (8.31mm) with history of heavy smoking. 2) Pneumonia, history of recent in hospital procedure. Recently treated with a full course of azithromycin. Will treat as HCAP and get sputum cultures.  3) Mediastinal adenopathy, RLL 8.5 mm nodule and history of heavy smoking. Really concerning for malignancy. Will need further workup.  P:   - Supplemental oxygen via [place] to keep O2 sat >94% - Heparin drip - Duonebs q 3hrs - Albuterol PRN - Symbicort BID - Will need further workup for mediastinal adenopathy and RLL nodule, likely EBUS TBNA. Won't start coumadin for now.  CARDIOVASCULAR A:  1) PE with evidence of right heart strain P:  - Echocardiogram - ICU monitoring  RENAL A:   1) Acute renal failure, likely pre renal. (was on lasix recently) P:   - Got some IVF's already - will hold lasix - Will follow chemistry in am  GASTROINTESTINAL A:   1) No issues P:   - Pepcid IV  HEMATOLOGIC A:   1) PE P:  - On heparin drip  INFECTIOUS A:   1) HCAP P:   - Zosyn - Vancomycin - Will follow sputum  culture  ENDOCRINE A:   1) No issues P:     NEUROLOGIC A:   1) No issues P:      I have personally obtained a history, examined the  patient, evaluated laboratory and imaging results, formulated the assessment and plan and placed orders. CRITICAL CARE: The patient is critically ill with multiple organ systems failure and requires high complexity decision making for assessment and support, frequent evaluation and titration of therapies, application of advanced monitoring technologies and extensive interpretation of multiple databases. Critical Care Time devoted to patient care services described in this note is 60 minutes.   Waynetta Pean, MD Pulmonary and [place] Pager: [phone]  07/10/2013, 9:10 PM

## 2013-07-10 NOTE — ED Notes (Signed)
Pt maintained 92% while ambulating around nursing desk.

## 2013-07-10 NOTE — ED Notes (Signed)
[facility] left with pt at this time. VSS upon departure.

## 2013-07-10 NOTE — Progress Notes (Signed)
ANTICOAGULATION CONSULT NOTE - Initial Consult  Pharmacy Consult for Heparin Indication: DVT  No Known Allergies  Patient Measurements: Height: 6\' 1"  (185.4 cm) IBW/kg (Calculated) : 79.9 Heparin Dosing Weight: 90kg  Vital Signs: Temp: 98.5 F (36.9 C) (05/29 0955) Temp src: Oral (05/29 0955) BP: 118/72 mmHg (05/29 1348) Pulse Rate: 83 (05/29 1348)  Labs:  Recent Labs  07/10/13 1014  HGB 16.3  HCT 49.4  PLT 192  CREATININE 1.45*  TROPONINI <0.30    The CrCl is unknown because both a height and weight (above a minimum accepted value) are required for this calculation.   Medical History: Past Medical History  Diagnosis Date  . H/O renal calculi   . Urethral stricture   . Wears hearing aid     right ear  . Hearing impaired person, right   . Arthritis   . Prostate cancer 10/24/07    radiation therapy  . Macular degeneration   . Ulcerative colitis   . History of kidney stones     Medications:  Scheduled:    Assessment: 68 yo M who was recently treated for PNA as an outpatient presented to ED with shortness of breath.   CXR + PNA.  Dopplers + LLE DVT, CT Chest pending.   CBC reviewed.  No bleeding noted.   Goal of Therapy:  Heparin level 0.3-0.7 units/ml Monitor platelets by anticoagulation protocol: Yes   Plan:  Give 5000 units bolus x 1 Start heparin infusion at 1400 units/hr Check anti-Xa level in 6 hours and daily while on heparin Continue to monitor H&H and platelets  Jonathan Goodwin 07/10/2013,3:44 PM

## 2013-07-10 NOTE — Progress Notes (Signed)
CRITICAL VALUE ALERT  Critical value received:  + MRSA nasal swab  Date of notification:  07/10/2013  Time of notification:  10:31 PM  Critical value read back:yes  Nurse who received alert:  Kazumi Lachney Bruce Donath  MD notified (1st page):  Per protocol  Time of first page:    MD notified (2nd page):  Time of second page:  Responding MD:  Per protocol  Time MD responded:

## 2013-07-10 NOTE — ED Provider Notes (Signed)
CSN: [id number]     Arrival date & time 07/10/13  0930 History  This chart was scribed for Jonathan Norrie, MD by Roe Coombs, ED Scribe. The patient was seen in room APA18/APA18. Patient's care was started at 10:39 AM.  Chief Complaint  Patient presents with  . Respiratory Distress    The history is provided by the patient. No language interpreter was used.    HPI Comments: Jonathan Goodwin is a 68 y.o. male (patient is a poor historian) who presents to the Emergency Department complaining of persistent cough for the past 1 month. He states that he is not currently producing sputum, but in prior weeks he has produced clear sputum. He is having soreness in his torso and chest due to coughing. Patient has also had constant, moderate, shortness of breath with occasional wheezing for several weeks, but he cannot remember exactly when this began. He had a cystoscopy at [place] on 06/30/13. He was seen at [facility] the day after his cytoscopy and he was diagnosed with bilateral pneumonia on CXR. He was prescribed an antibiotic which patient says he has taken as instructed, clarithromycin 500 mg tid for 14 days. He was seen on 5/22 and they thought he might have CHF.  He states that when he doesn't smoke for a few hours his breathing improves. He reports associated decreased appetite and states that he has not eaten a full meal in weeks. He is also having fever (subjective because he has not measured this). Patient further states that he has had left lower extremity swelling with pain and hardness for the past 1-2 weeks. He denies chills, chest pain, vomiting or other symptoms. Patient has never been diagnosed with COPD or any other pulmonary disease. However he did receive nebulizer treatments in 2009.   PCP [facility]   Past Medical History  Diagnosis Date  . H/O renal calculi   . Urethral stricture   . Wears hearing aid     right ear  . Hearing impaired person, right    . Arthritis   . Prostate cancer 10/24/07    radiation therapy  . Macular degeneration   . Ulcerative colitis   . History of kidney stones    Past Surgical History  Procedure Laterality Date  . Knee arthroscopy  1993    right  . Appendectomy  2010    [facility], Dr. Romona Curls  . Shoulder surgery      left, for chronic dislocation  . Cystoscopy  2010    [facility], Dr. Maryland Pink  . Urethrotomy  06/12/2011    Procedure: CYSTOSCOPY/URETHROTOMY;  Surgeon: Marissa Nestle, MD;  Location: [facility];  Service: Urology;  Laterality: N/A;  optical urethrotomy  . Cataract extraction w/phaco Left 01/15/2013    Procedure: CATARACT EXTRACTION PHACO AND INTRAOCULAR LENS PLACEMENT (IOC);  Surgeon: Tonny Branch, MD;  Location: [facility];  Service: Ophthalmology;  Laterality: Left;  CDE:22.42  . Cystoscopy with urethral dilatation N/A 06/30/2013    Procedure: CYSTOSCOPY WITH URETHRAL DILATATION;  Surgeon: Marissa Nestle, MD;  Location: [facility];  Service: Urology;  Laterality: N/A;  . Rectal exam under anesthesia N/A 06/30/2013    Procedure: RECTAL EXAM UNDER ANESTHESIA;  Surgeon: Marissa Nestle, MD;  Location: [facility];  Service: Urology;  Laterality: N/A;  . Urethrotomy N/A 06/30/2013    Procedure: CYSTOSCOPY/URETHROTOMY;  Surgeon: Marissa Nestle, MD;  Location: [facility];  Service: Urology;  Laterality: N/A;  . Cystoscopy 2015  History reviewed. No pertinent family history. History  Substance Use Topics  . Smoking status: Current Every Day Smoker -- 1.00 packs/day for 58 years    Types: Cigarettes  . Smokeless tobacco: Not on file  . Alcohol Use: Yes     Comment: occasional  Patient lives at home with his wife.  Review of Systems  Constitutional: Positive for fever and appetite change. Negative for chills.  Respiratory: Positive for cough and shortness of breath.   Cardiovascular: Positive for leg swelling. Negative for chest pain.  All other systems reviewed and are negative.     Allergies  Review of  patient's allergies indicates no known allergies.  Home Medications   Prior to Admission medications   Medication Sig Start Date End Date Taking? Authorizing Provider  hydrochlorothiazide (HYDRODIURIL) 12.5 MG tablet Take 12.5 mg by mouth daily. 07/07/13  Yes Historical Provider, MD   Triage Vitals: BP 130/95  Pulse 98  Temp(Src) 98.5 F (36.9 C) (Oral)  Resp 26  Ht 6\' 1"  (1.854 m)  SpO2 96%  Vital signs normal   Physical Exam  Nursing note and vitals reviewed. Constitutional: He is oriented to person, place, and time. He appears well-developed and well-nourished.  Non-toxic appearance. He does not appear ill. No distress.  HENT:  Head: Normocephalic and atraumatic.  Right Ear: External ear normal.  Left Ear: External ear normal.  Nose: Nose normal. No mucosal edema or rhinorrhea.  Mouth/Throat: Oropharynx is clear and moist and mucous membranes are normal. No dental abscesses or uvula swelling.  Eyes: Conjunctivae and EOM are normal. Pupils are equal, round, and reactive to light.  Neck: Normal range of motion and full passive range of motion without pain. Neck supple.  Cardiovascular: Normal rate, regular rhythm and normal heart sounds.  Exam reveals no gallop and no friction rub.   No murmur heard. Pulmonary/Chest: Effort normal. No respiratory distress. He has decreased breath sounds. He has wheezes. He has rhonchi. He has no rales. He exhibits no tenderness and no crepitus.  Greatly diminished breath sounds. Scattered wheezing and rhonchi.  Abdominal: Soft. Normal appearance and bowel sounds are normal. He exhibits no distension. There is no tenderness. There is no rebound and no guarding.  Musculoskeletal: Normal range of motion. He exhibits no edema and no tenderness.  No pitting edema. Varicose veins present to lower extremities.  Neurological: He is alert and oriented to person, place, and time. He has normal strength. No cranial nerve deficit.  Skin: Skin is warm, dry  and intact. No rash noted. No erythema. No pallor.  Psychiatric: He has a normal mood and affect. His speech is normal and behavior is normal. His mood appears not anxious.    ED Course  Procedures (including critical care time) Medications  0.9 %  sodium chloride infusion ( Intravenous New Bag/Given 07/10/13 1117)  heparin bolus via infusion 5,000 Units (not administered)  heparin ADULT infusion 100 units/mL (25000 units/250 mL) (not administered)  vancomycin (VANCOCIN) IVPB 1000 mg/200 mL premix (0 mg Intravenous Stopped 07/10/13 1257)  piperacillin-tazobactam (ZOSYN) IVPB 3.375 g (0 g Intravenous Stopped 07/10/13 1349)  methylPREDNISolone sodium succinate (SOLU-MEDROL) 125 mg/2 mL injection 125 mg (125 mg Intravenous Given 07/10/13 1108)  ipratropium-albuterol (DUONEB) 0.5-2.5 (3) MG/3ML nebulizer solution 3 mL (3 mLs Nebulization Given 07/10/13 1111)  albuterol (PROVENTIL) (2.5 MG/3ML) 0.083% nebulizer solution 2.5 mg (2.5 mg Nebulization Given 07/10/13 1111)  iohexol (OMNIPAQUE) 350 MG/ML injection 100 mL (100 mLs Intravenous Contrast Given 07/10/13 1519)  DIAGNOSTIC STUDIES: Oxygen Saturation is 96% on room air, adequate by my interpretation.    COORDINATION OF CARE: 10:45 AM- Patient informed of current plan for treatment and evaluation and agrees with plan at this time.   12:17 PM - On recheck, patient has improved air movement without wheezes after breathibng treatments. He still has some shortness of breath.   PT ambulated by nursing staff, his pulse ox remained 93% on RA. Pt is going to be evaluated for DVT/PE, he reports swelling and pain in his LLE recently.  15:38 Dr Jobe Igo called his CT results, had bilateral PE with right heart strain, possible lung cancer   15:53 Dr Gar Gibbon, critical care, accepts patient in transfer to [facility] ICU.   Results for orders placed during the hospital encounter of 07/10/13  CBC WITH DIFFERENTIAL      Result Value Ref Range   WBC 14.8 (*)  4.0 - 10.5 K/uL   RBC 5.51  4.22 - 5.81 MIL/uL   Hemoglobin 16.3  13.0 - 17.0 g/dL   HCT 49.4  39.0 - 52.0 %   MCV 89.7  78.0 - 100.0 fL   MCH 29.6  26.0 - 34.0 pg   MCHC 33.0  30.0 - 36.0 g/dL   RDW 14.7  11.5 - 15.5 %   Platelets 192  150 - 400 K/uL   Neutrophils Relative % 85 (*) 43 - 77 %   Neutro Abs 12.6 (*) 1.7 - 7.7 K/uL   Lymphocytes Relative 6 (*) 12 - 46 %   Lymphs Abs 1.0  0.7 - 4.0 K/uL   Monocytes Relative 7  3 - 12 %   Monocytes Absolute 1.0  0.1 - 1.0 K/uL   Eosinophils Relative 2  0 - 5 %   Eosinophils Absolute 0.2  0.0 - 0.7 K/uL   Basophils Relative 0  0 - 1 %   Basophils Absolute 0.0  0.0 - 0.1 K/uL  BASIC METABOLIC PANEL      Result Value Ref Range   Sodium 140  137 - 147 mEq/L   Potassium 4.7  3.7 - 5.3 mEq/L   Chloride 98  96 - 112 mEq/L   CO2 27  19 - 32 mEq/L   Glucose, Bld 131 (*) 70 - 99 mg/dL   BUN 20  6 - 23 mg/dL   Creatinine, Ser 1.45 (*) 0.50 - 1.35 mg/dL   Calcium 9.5  8.4 - 10.5 mg/dL   GFR calc non Af Amer 48 (*) >90 mL/min   GFR calc Af Amer 55 (*) >90 mL/min  TROPONIN I      Result Value Ref Range   Troponin I <0.30  <0.30 ng/mL  PRO B NATRIURETIC PEPTIDE      Result Value Ref Range   Pro B Natriuretic peptide (BNP) 1349.0 (*) 0 - 125 pg/mL  URINALYSIS, ROUTINE W REFLEX MICROSCOPIC      Result Value Ref Range   Color, Urine ORANGE (*) YELLOW   APPearance HAZY (*) CLEAR   Specific Gravity, Urine 1.020  1.005 - 1.030   pH 6.0  5.0 - 8.0   Glucose, UA NEGATIVE  NEGATIVE mg/dL   Hgb urine dipstick SMALL (*) NEGATIVE   Bilirubin Urine NEGATIVE  NEGATIVE   Ketones, ur NEGATIVE  NEGATIVE mg/dL   Protein, ur 30 (*) NEGATIVE mg/dL   Urobilinogen, UA 0.2  0.0 - 1.0 mg/dL   Nitrite NEGATIVE  NEGATIVE   Leukocytes, UA NEGATIVE  NEGATIVE  URINE MICROSCOPIC-ADD ON  Result Value Ref Range   Squamous Epithelial / LPF RARE  RARE   WBC, UA 3-6  <3 WBC/hpf   RBC / HPF 3-6  <3 RBC/hpf   Bacteria, UA RARE  RARE   Casts HYALINE CASTS (*)  NEGATIVE   Laboratory interpretation all normal except leukocytosis, renal insufficiency, mildly elevated Ddimer   Imaging Review  Dg Chest 2 View  07/10/2013   CLINICAL DATA:  Acute respiratory distress. Severe shortness of breath. Cough. Pneumonia.  EXAM: CHEST  2 VIEW  COMPARISON:  07/02/2013  FINDINGS: Changes of COPD are again demonstrated. Asymmetric airspace disease is seen in the right middle lobe, suspicious for pneumonia superimposed on COPD. No evidence of left lung airspace disease or pleural effusion. Heart size is normal.  IMPRESSION: Right middle lobe airspace disease, suspicious for pneumonia. Recommend chest radiographic followup in several weeks to confirm resolution.  COPD.   Electronically Signed   By: Earle Gell M.D.   On: 07/10/2013 10:30   Ct Angio Chest W/cm &/or Wo Cm  07/10/2013   CLINICAL DATA:  Respiratory distress. Shortness of breath. Left leg pain and swelling.  EXAM: CT ANGIOGRAPHY CHEST WITH CONTRAST  TECHNIQUE: Multidetector CT imaging of the chest was performed using the standard protocol during bolus administration of intravenous contrast. Multiplanar CT image reconstructions and MIPs were obtained to evaluate the vascular anatomy.  CONTRAST:  158mL OMNIPAQUE IOHEXOL 350 MG/ML SOLN  COMPARISON:  Radiography same day.  CT 06/06/2008.  FINDINGS: The study is positive for extensive bilateral pulmonary embolic disease, more massive on the right than the left. There is evidence of right ventricular strain with the right ventricular to left ventricular ratio of 1.5.  There is atherosclerosis of the aorta and of the coronary arteries. No pleural or pericardial fluid.  In evaluating the lungs, there is considerable motion degradation. On the right, there is infiltrate in the middle lobe with volume loss consistent with pneumonia. Some patchy density is also present in the lower lobe. Just above the diaphragm in the right lower lobe, there is a nodule measuring 8.5 mm in  diameter. There may be other smaller nodules but it is difficult to be certain given the motion degradation.  On the left, there is some scarring and atelectasis. Patchy infiltrate is possible. Focal nodules are difficult to accurately characterize given the degree of motion degradation. Particularly, in the peripheral left lower lobe image number 77 there is a 9.5 mm nodular shadow. On image 71 there is a 7.4 mm nodular shadow. On image 65 there is a 11 mm nodular shadow.  There are abnormal lymph nodes in the hilar and mediastinal regions. The largest nodes are at the aortopulmonary window on the left measuring 26 x 15 mm and in the left hilum measuring 26 x 17 mm. These are larger than were seen previously.  There is a background pattern of centrilobular emphysema.  Scans in the upper abdomen show chronic adrenal enlargement on the left consistent with a benign adenoma.  Review of the MIP images confirms the above findings.  IMPRESSION: Positive for acute PE with CT evidence of right heartstrain (RV/LV Ratio = 1.5) consistent with at least submassive (intermediate risk)PE. The presence of right heart strain has been associated with anincreased risk of morbidity and mortality. Consultation with [facility] is recommended.  Extensive coronary artery calcification.  Abnormal density in the right middle lobe and to a lesser extent the right lower lobe that could be pneumonia. There is  a 8.5 mm nodule in the right lower lobe just above the diaphragm. The possibility of interstitial spread of cancer does exist.  The pulmonary images are degraded by motion. There are other areas that could represent nodules that are not precisely evaluated. There is worrisome nodal enlargement in the mediastinum and the hila.  Background pattern of centrilobular emphysema.  Critical Value/emergent results were called by telephone at the time of interpretation on 07/10/2013 at 3:46 PM to Dr. Rolland Porter , who  verbally acknowledged these results.   Electronically Signed   By: Nelson Chimes M.D.   On: 07/10/2013 15:49   US Venous Img Lower Unilateral Left  07/10/2013   CLINICAL DATA:  Respiratory distress  EXAM: Left LOWER EXTREMITY VENOUS DOPPLER ULTRASOUND  TECHNIQUE: Gray-scale sonography with graded compression, as well as color Doppler and duplex ultrasound were performed to evaluate the lower extremity deep venous systems from the level of the common femoral vein and including the common femoral, femoral, profunda femoral, popliteal and calf veins including the posterior tibial, peroneal and gastrocnemius veins when visible. The superficial great saphenous vein was also interrogated. Spectral Doppler was utilized to evaluate flow at rest and with distal augmentation maneuvers in the common femoral, femoral and popliteal veins.  COMPARISON:  None.  FINDINGS: Common Femoral Vein: No evidence of thrombus. Normal compressibility, respiratory phasicity and response to augmentation.  Saphenofemoral Junction: No evidence of thrombus. Normal compressibility and flow on color Doppler imaging.  Profunda Femoral Vein: No evidence of thrombus. Normal compressibility and flow on color Doppler imaging.  Femoral Vein: Deep venous thrombosis is noted.  Popliteal Vein: Deep venous thrombosis is noted.  Calf Veins: Deep venous thrombosis is noted in the posterior tibial, peroneal and anterior tibial veins.  Superficial Great Saphenous Vein: No evidence of thrombus. Normal compressibility and flow on color Doppler imaging.  Venous Reflux:  None.  Other Findings:  None.  IMPRESSION: Diffuse left leg deep venous thrombosis.   Electronically Signed   By: Inez Catalina M.D.   On: 07/10/2013 15:10       EKG Interpretation   Date/Time:  Friday Jul 10 2013 09:57:04 EDT Ventricular Rate:  96 PR Interval:  158 QRS Duration: 80 QT Interval:  344 QTC Calculation: 434 R Axis:   -76 Text Interpretation:  Normal sinus rhythm Possible  Left atrial enlargement  Left axis deviation Septal infarct , age undetermined Since last tracing  rate faster Confirmed by Greg Cratty  MD-I, Rajendra Spiller ([id number]) on 07/10/2013 12:18:10 PM      MDM   Final diagnoses:  CAP (community acquired pneumonia)  Pulmonary embolism    Plan transfer to [facility] for admission to ICU   CRITICAL CARE Performed by: Destyne Goodreau L Zohra Clavel Total critical care time: 38 min Critical care time was exclusive of separately billable procedures and treating other patients. Critical care was necessary to treat or prevent imminent or life-threatening deterioration. Critical care was time spent personally by me on the following activities: development of treatment plan with patient and/or surrogate as well as nursing, discussions with consultants, evaluation of patient's response to treatment, examination of patient, obtaining history from patient or surrogate, ordering and performing treatments and interventions, ordering and review of laboratory studies, ordering and review of radiographic studies, pulse oximetry and re-evaluation of patient's condition.    I personally performed the services described in this documentation, which was scribed in my presence. The recorded information has been reviewed and considered.  Rolland Porter, MD, FACEP    Daleen Bo  Harmon Dun, MD 07/10/13 1606

## 2013-07-10 NOTE — ED Notes (Signed)
Went to PCP last week, dx with PNA, finished antibiotic, still felt short of breath, went back to PCP, was told he had CHF and was prescribed HCTZ 12.5 mg once daily, been taking for 3 days, states swelling in legs are better, but still feels short of breath, has dry cough, still smoking cigarettes, no edema in legs noted

## 2013-07-11 DIAGNOSIS — I2699 Other pulmonary embolism without acute cor pulmonale: Principal | ICD-10-CM

## 2013-07-11 DIAGNOSIS — I369 Nonrheumatic tricuspid valve disorder, unspecified: Secondary | ICD-10-CM

## 2013-07-11 DIAGNOSIS — J189 Pneumonia, unspecified organism: Secondary | ICD-10-CM

## 2013-07-11 DIAGNOSIS — I82409 Acute embolism and thrombosis of unspecified deep veins of unspecified lower extremity: Secondary | ICD-10-CM

## 2013-07-11 DIAGNOSIS — R918 Other nonspecific abnormal finding of lung field: Secondary | ICD-10-CM

## 2013-07-11 LAB — BASIC METABOLIC PANEL
BUN: 29 mg/dL — ABNORMAL HIGH (ref 6–23)
CHLORIDE: 100 meq/L (ref 96–112)
CO2: 23 meq/L (ref 19–32)
CREATININE: 1.52 mg/dL — AB (ref 0.50–1.35)
Calcium: 8.6 mg/dL (ref 8.4–10.5)
GFR calc Af Amer: 52 mL/min — ABNORMAL LOW (ref >90)
GFR calc non Af Amer: 45 mL/min — ABNORMAL LOW (ref >90)
GLUCOSE: 171 mg/dL — AB (ref 70–99)
Potassium: 4.8 mEq/L (ref 3.7–5.3)
Sodium: 140 mEq/L (ref 137–147)

## 2013-07-11 LAB — EXPECTORATED SPUTUM ASSESSMENT W GRAM STAIN, RFLX TO RESP C

## 2013-07-11 LAB — CBC
HCT: 45.2 % (ref 39.0–52.0)
Hemoglobin: 15.2 g/dL (ref 13.0–17.0)
MCH: 30.5 pg (ref 26.0–34.0)
MCHC: 33.6 g/dL (ref 30.0–36.0)
MCV: 90.6 fL (ref 78.0–100.0)
PLATELETS: 179 10*3/uL (ref 150–400)
RBC: 4.99 MIL/uL (ref 4.22–5.81)
RDW: 14.8 % (ref 11.5–15.5)
WBC: 16.6 10*3/uL — ABNORMAL HIGH (ref 4.0–10.5)

## 2013-07-11 LAB — HEPARIN LEVEL (UNFRACTIONATED)
Heparin Unfractionated: 0.45 IU/mL (ref 0.30–0.70)
Heparin Unfractionated: 0.48 IU/mL (ref 0.30–0.70)

## 2013-07-11 MED ORDER — RIVAROXABAN 15 MG PO TABS
15.0000 mg | ORAL_TABLET | Freq: Two times a day (BID) | ORAL | Status: DC
  Administered 2013-07-11 – 2013-07-13 (×4): 15 mg via ORAL
  Filled 2013-07-11 (×8): qty 1

## 2013-07-11 MED ORDER — RIVAROXABAN 20 MG PO TABS: 20.0000 mg | ORAL_TABLET | Freq: Every day | ORAL | Status: DC

## 2013-07-11 NOTE — Progress Notes (Signed)
ANTICOAGULATION CONSULT NOTE - Follow Up Consult  Pharmacy Consult for Heparin  Indication: pulmonary embolus and DVT  No Known Allergies  Patient Measurements: Height: 6\' 1"  (185.4 cm) Weight: 193 lb 9 oz (87.8 kg) IBW/kg (Calculated) : 79.9  Vital Signs: Temp: 97.3 F (36.3 C) (05/29 2000) Temp src: Oral (05/29 2000) BP: 106/61 mmHg (05/30 0000) Pulse Rate: 75 (05/30 0000)  Labs:  Recent Labs  07/10/13 1014 07/10/13 1559 07/10/13 2359  HGB 16.3  --   --   HCT 49.4  --   --   PLT 192  --   --   APTT  --  31  --   LABPROT  --  14.6  --   INR  --  1.16  --   HEPARINUNFRC  --   --  0.48  CREATININE 1.45*  --   --   TROPONINI <0.30  --   --     Estimated Creatinine Clearance: 54.3 ml/min (by C-G formula based on Cr of 1.45).   Medications:  Heparin 1400 units/hr  Assessment: 68 y/o M on heparin for new DVT and PE. First HL is 0.48. Other labs as above.   Goal of Therapy:  Heparin level 0.3-0.7 units/ml Monitor platelets by anticoagulation protocol: Yes   Plan:  -Continue heparin at 1400 units/hr -AM HL -Daily CBC/HL -Monitor for bleeding  Narda Bonds 07/11/2013,1:02 AM

## 2013-07-11 NOTE — Progress Notes (Signed)
PULMONARY / CRITICAL CARE MEDICINE   Name: Jonathan Goodwin MRN: [id number] DOB: [birth year]    ADMISSION DATE:  07/10/2013  PRIMARY SERVICE: PCCM  CHIEF COMPLAINT:  PE  BRIEF PATIENT DESCRIPTION:  68 years old male with PMH relevant for prostate cancer s/p radiation and urethral stricture post cystoscopic urethrotomy. Presents with two weeks of left LE swelling and about a week of worsening SOB and cough. CTA positive for large bilateral PE's R> L with evidence of right heart strain on CT and elevated BNP but negative troponin. Hemodynamically stable saturating 96% on 2 L [place].   SIGNIFICANT EVENTS / STUDIES:  CTA 5/29: Large bilateral PE's Doppler legs 5/29: DVT Lt leg Echo 5/31>>>  LINES / TUBES: - Peripheral IV's  CULTURES: - Sputum culture ordered  ANTIBIOTICS: - Zosyn 5/29>>> - Vancomycin 5/29>>>  SUBJECTIVE:  Denies chest pain, dyspnea.  VITAL SIGNS: Temp:  [97.3 F (36.3 C)-98.4 F (36.9 C)] 98.3 F (36.8 C) (05/30 0700) Pulse Rate:  [64-92] 78 (05/30 0600) Resp:  [12-29] 20 (05/30 0700) BP: (79-137)/(21-96) 118/71 mmHg (05/30 0700) SpO2:  [89 %-97 %] 97 % (05/30 0844) Weight:  [87.8 kg (193 lb 9 oz)-89.3 kg (196 lb 13.9 oz)] 89.3 kg (196 lb 13.9 oz) (05/30 0500) 4 liters   INTAKE / OUTPUT: Intake/Output     05/29 0701 - 05/30 0700 05/30 0701 - 05/31 0700   P.O. 480    I.V. (mL/kg) 446.3 (5)    IV Piggyback 275    Total Intake(mL/kg) 1201.3 (13.5)    Urine (mL/kg/hr) 1275    Total Output 1275     Net -73.7            PHYSICAL EXAMINATION: General: Pleasant male patient in no acute distress. ENT: Oropharynx clear. Moist mucous membranes. No thrush Heart: Normal S1, S2. No murmurs, rubs, or gallops appreciated. No bruits, equal pulses. Lungs: Normal excursion, no dullness to percussion. Good air movement bilaterally, without wheezes or crackles. Normal upper airway sounds without evidence of stridor. Abdomen: Abdomen soft, non-tender and not distended,  normoactive bowel sounds. No hepatosplenomegaly or masses. Musculoskeletal: No clubbing or synovitis. Mild increase in size of LLE. Skin: No rashes or lesions Neuro: No focal neurologic deficits.   LABS:  CBC  Recent Labs Lab 07/10/13 1014 07/11/13 0308  WBC 14.8* 16.6*  HGB 16.3 15.2  HCT 49.4 45.2  PLT 192 179   Coag's  Recent Labs Lab 07/10/13 1559  APTT 31  INR 1.16   BMET  Recent Labs Lab 07/10/13 1014 07/11/13 0308  NA 140 140  K 4.7 4.8  CL 98 100  CO2 27 23  BUN 20 29*  CREATININE 1.45* 1.52*  GLUCOSE 131* 171*   Cardiac Enzymes  Recent Labs Lab 07/10/13 1014  TROPONINI <0.30  PROBNP 1349.0*   Glucose  Recent Labs Lab 07/10/13 1821  GLUCAP 169*    Imaging Dg Chest 2 View  07/10/2013   CLINICAL DATA:  Acute respiratory distress. Severe shortness of breath. Cough. Pneumonia.  EXAM: CHEST  2 VIEW  COMPARISON:  07/02/2013  FINDINGS: Changes of COPD are again demonstrated. Asymmetric airspace disease is seen in the right middle lobe, suspicious for pneumonia superimposed on COPD. No evidence of left lung airspace disease or pleural effusion. Heart size is normal.  IMPRESSION: Right middle lobe airspace disease, suspicious for pneumonia. Recommend chest radiographic followup in several weeks to confirm resolution.  COPD.   Electronically Signed   By: Sharrie Rothman.D.  On: 07/10/2013 10:30   Ct Angio Chest W/cm &/or Wo Cm  07/10/2013   CLINICAL DATA:  Respiratory distress. Shortness of breath. Left leg pain and swelling.  EXAM: CT ANGIOGRAPHY CHEST WITH CONTRAST  TECHNIQUE: Multidetector CT imaging of the chest was performed using the standard protocol during bolus administration of intravenous contrast. Multiplanar CT image reconstructions and MIPs were obtained to evaluate the vascular anatomy.  CONTRAST:  178mL OMNIPAQUE IOHEXOL 350 MG/ML SOLN  COMPARISON:  Radiography same day.  CT 06/06/2008.  FINDINGS: The study is positive for extensive bilateral  pulmonary embolic disease, more massive on the right than the left. There is evidence of right ventricular strain with the right ventricular to left ventricular ratio of 1.5.  There is atherosclerosis of the aorta and of the coronary arteries. No pleural or pericardial fluid.  In evaluating the lungs, there is considerable motion degradation. On the right, there is infiltrate in the middle lobe with volume loss consistent with pneumonia. Some patchy density is also present in the lower lobe. Just above the diaphragm in the right lower lobe, there is a nodule measuring 8.5 mm in diameter. There may be other smaller nodules but it is difficult to be certain given the motion degradation.  On the left, there is some scarring and atelectasis. Patchy infiltrate is possible. Focal nodules are difficult to accurately characterize given the degree of motion degradation. Particularly, in the peripheral left lower lobe image number 77 there is a 9.5 mm nodular shadow. On image 71 there is a 7.4 mm nodular shadow. On image 65 there is a 11 mm nodular shadow.  There are abnormal lymph nodes in the hilar and mediastinal regions. The largest nodes are at the aortopulmonary window on the left measuring 26 x 15 mm and in the left hilum measuring 26 x 17 mm. These are larger than were seen previously.  There is a background pattern of centrilobular emphysema.  Scans in the upper abdomen show chronic adrenal enlargement on the left consistent with a benign adenoma.  Review of the MIP images confirms the above findings.  IMPRESSION: Positive for acute PE with CT evidence of right heartstrain (RV/LV Ratio = 1.5) consistent with at least submassive (intermediate risk)PE. The presence of right heart strain has been associated with anincreased risk of morbidity and mortality. Consultation with [place] is recommended.  Extensive coronary artery calcification.  Abnormal density in the right middle lobe and to a  lesser extent the right lower lobe that could be pneumonia. There is a 8.5 mm nodule in the right lower lobe just above the diaphragm. The possibility of interstitial spread of cancer does exist.  The pulmonary images are degraded by motion. There are other areas that could represent nodules that are not precisely evaluated. There is worrisome nodal enlargement in the mediastinum and the hila.  Background pattern of centrilobular emphysema.  Critical Value/emergent results were called by telephone at the time of interpretation on 07/10/2013 at 3:46 PM to Dr. Rolland Porter , who verbally acknowledged these results.   Electronically Signed   By: Nelson Chimes M.D.   On: 07/10/2013 15:49   US Venous Img Lower Unilateral Left  07/10/2013   CLINICAL DATA:  Respiratory distress  EXAM: Left LOWER EXTREMITY VENOUS DOPPLER ULTRASOUND  TECHNIQUE: Gray-scale sonography with graded compression, as well as color Doppler and duplex ultrasound were performed to evaluate the lower extremity deep venous systems from the level of the common femoral vein and including the  common femoral, femoral, profunda femoral, popliteal and calf veins including the posterior tibial, peroneal and gastrocnemius veins when visible. The superficial great saphenous vein was also interrogated. Spectral Doppler was utilized to evaluate flow at rest and with distal augmentation maneuvers in the common femoral, femoral and popliteal veins.  COMPARISON:  None.  FINDINGS: Common Femoral Vein: No evidence of thrombus. Normal compressibility, respiratory phasicity and response to augmentation.  Saphenofemoral Junction: No evidence of thrombus. Normal compressibility and flow on color Doppler imaging.  Profunda Femoral Vein: No evidence of thrombus. Normal compressibility and flow on color Doppler imaging.  Femoral Vein: Deep venous thrombosis is noted.  Popliteal Vein: Deep venous thrombosis is noted.  Calf Veins: Deep venous thrombosis is noted in the posterior  tibial, peroneal and anterior tibial veins.  Superficial Great Saphenous Vein: No evidence of thrombus. Normal compressibility and flow on color Doppler imaging.  Venous Reflux:  None.  Other Findings:  None.  IMPRESSION: Diffuse left leg deep venous thrombosis.   Electronically Signed   By: Inez Catalina M.D.   On: 07/10/2013 15:10     ASSESSMENT / PLAN:  Submassive PE/ LLE DVT long distance travel (7hrs) about a month ago and recent cystoscopy but not in bed for a long period of time. Patient with mediastinal adenopathy and a RLL nodule (8.79mm) with history of heavy smoking. Plan  Start  xarelto   F/u echo   Compression hose  Mobilize    Possible HCAP Possible COPD Plan  Continue empiric coverage  Trend PCT   Wean Fio2  Continue BDs  F/u cxr am 5/31  Mediastinal adenopathy, RLL 8.5 mm nodule and history of heavy smoking. Really concerning for malignancy. Will need further workup. Plan:    - Will need further workup for mediastinal adenopathy and RLL nodule >> will delay for until more stable   Acute renal failure, likely pre renal. (was on lasix recently) Plan:    Continue hold lasix  Continue IVFs  F/u chem in am     07/11/2013, 10:33 AM  Reviewed above, examined pt, and agree.  Hemodynamically stable.  Discussed different options for oral anti-coagulation.  He is agreeable to proceed with xarelto.  Will f/u Echo.  Will continue IV Abx on 5/30 >> likely can change to PO Abx on 5/31.  Chesley Mires, MD [facility] Pulmonary/Critical Care 07/11/2013, 11:58 AM Pager:  [phone] After 3pm call: [phone]

## 2013-07-11 NOTE — Progress Notes (Signed)
  Echocardiogram 2D Echocardiogram has been performed.  [place] 07/11/2013, 10:04 AM

## 2013-07-11 NOTE — Progress Notes (Signed)
Nutrition Brief Note  Patient identified on the Malnutrition Screening Tool (MST) Report  Wt Readings from Last 15 Encounters:  07/11/13 196 lb 13.9 oz (89.3 kg)  06/30/13 200 lb (90.719 kg)  06/30/13 200 lb (90.719 kg)  06/25/13 200 lb (90.719 kg)  05/22/13 206 lb (93.441 kg)  01/05/13 214 lb (97.07 kg)  05/06/12 200 lb (90.719 kg)  06/11/11 225 lb (102.059 kg)    Body mass index is 25.98 kg/(m^2). Patient meets criteria for overweight based on current BMI.   Current diet order is heart healthy, patient is consuming approximately 100% of meals at this time per pt report. Labs and medications reviewed. Pt with PMH relevant for prostate cancer s/p radiation and urethral stricture post cystoscopic urethrotomy. Presents with two weeks of left LE swelling and about a week of worsening SOB and cough.  -Pt reports he was living off of a large amount of "blender sized" milkshakes for the past 2 weeks, not eating any meals, also drinking energy drinks, milk, and pepsi -Said he lost 20 pounds during this time frame -Reports ever since he came to the hospital and felt better, he has been eating 100% of meals and looking forward to next meal -Denies wanting any nutritional supplements    No nutrition interventions warranted at this time. If nutrition issues arise, please consult RD.   Mikey College MS, RD, LDN [phone] Weekend/After Hours Pager

## 2013-07-12 ENCOUNTER — Inpatient Hospital Stay (HOSPITAL_COMMUNITY): Payer: Medicare Other

## 2013-07-12 LAB — CBC
HCT: 42.7 % (ref 39.0–52.0)
Hemoglobin: 14.4 g/dL (ref 13.0–17.0)
MCH: 30.7 pg (ref 26.0–34.0)
MCHC: 33.7 g/dL (ref 30.0–36.0)
MCV: 91 fL (ref 78.0–100.0)
Platelets: 208 10*3/uL (ref 150–400)
RBC: 4.69 MIL/uL (ref 4.22–5.81)
RDW: 15.1 % (ref 11.5–15.5)
WBC: 23.1 10*3/uL — ABNORMAL HIGH (ref 4.0–10.5)

## 2013-07-12 LAB — COMPREHENSIVE METABOLIC PANEL
ALT: 30 U/L (ref 0–53)
AST: 24 U/L (ref 0–37)
Albumin: 2.6 g/dL — ABNORMAL LOW (ref 3.5–5.2)
Alkaline Phosphatase: 133 U/L — ABNORMAL HIGH (ref 39–117)
BUN: 30 mg/dL — ABNORMAL HIGH (ref 6–23)
CALCIUM: 8.6 mg/dL (ref 8.4–10.5)
CO2: 22 mEq/L (ref 19–32)
Chloride: 106 mEq/L (ref 96–112)
Creatinine, Ser: 1.26 mg/dL (ref 0.50–1.35)
GFR calc Af Amer: 65 mL/min — ABNORMAL LOW (ref >90)
GFR calc non Af Amer: 57 mL/min — ABNORMAL LOW (ref >90)
Glucose, Bld: 117 mg/dL — ABNORMAL HIGH (ref 70–99)
Potassium: 4.7 mEq/L (ref 3.7–5.3)
SODIUM: 142 meq/L (ref 137–147)
TOTAL PROTEIN: 6.4 g/dL (ref 6.0–8.3)
Total Bilirubin: 0.5 mg/dL (ref 0.3–1.2)

## 2013-07-12 MED ORDER — LEVOFLOXACIN 750 MG PO TABS
750.0000 mg | ORAL_TABLET | Freq: Every day | ORAL | Status: DC
  Administered 2013-07-12 – 2013-07-13 (×2): 750 mg via ORAL
  Filled 2013-07-12 (×2): qty 1

## 2013-07-12 MED ORDER — LEVOFLOXACIN 750 MG PO TABS
750.0000 mg | ORAL_TABLET | Freq: Every day | ORAL | Status: DC
  Filled 2013-07-12: qty 1

## 2013-07-12 MED ORDER — SODIUM CHLORIDE 0.9 % IV SOLN
INTRAVENOUS | Status: DC | PRN
Start: 2013-07-12 — End: 2013-07-13

## 2013-07-12 NOTE — Progress Notes (Addendum)
PULMONARY / CRITICAL CARE MEDICINE   Name: Jonathan Goodwin MRN: [id number] DOB: 1945/10/02    ADMISSION DATE:  07/10/2013  PRIMARY SERVICE: PCCM  CHIEF COMPLAINT:  PE  BRIEF PATIENT DESCRIPTION:  68 years old male with PMH relevant for prostate cancer s/p radiation and urethral stricture post cystoscopic urethrotomy. Presents with two weeks of left LE swelling and about a week of worsening SOB and cough. CTA positive for large bilateral PE's R> L with evidence of right heart strain on CT and elevated BNP but negative troponin. Hemodynamically stable saturating 96% on 2 L [place].   SIGNIFICANT EVENTS / STUDIES:  CTA 5/29: Large bilateral PE's Doppler legs 5/29: DVT Lt leg Echo 5/31>>>Wall thickness was increased in a pattern of mild LVH. Systolic function was normal. The estimated ejection fraction was in the range of 55% to 60%.Doppler parameters are consistent with abnormal left ventricular relaxation (grade 1 diastolic dysfunction). - Right ventricle: The cavity size was moderately dilated. Wallthickness was normal.- Right atrium: The atrium was moderately dilated.- Pulmonary arteries: PA peak pressure: 55 mm Hg (S).    LINES / TUBES: - Peripheral IV's  CULTURES: - Sputum culture ordered  ANTIBIOTICS: - Zosyn 5/29>>> 5/31 - Vancomycin 5/29>>> 5/31 - Levaquin 5/31 >>>  SUBJECTIVE:  Denies chest pain, dyspnea.  VITAL SIGNS: Temp:  [97.8 F (36.6 C)-98.3 F (36.8 C)] 98.1 F (36.7 C) (05/31 0807) Pulse Rate:  [25-138] 55 (05/31 0800) Resp:  [10-27] 21 (05/31 0800) BP: (93-136)/(55-85) 123/80 mmHg (05/31 0800) SpO2:  [89 %-100 %] 94 % (05/31 0800) Weight:  [86.9 kg (191 lb 9.3 oz)] 86.9 kg (191 lb 9.3 oz) (05/31 0500) 3 liters   INTAKE / OUTPUT: Intake/Output     05/30 0701 - 05/31 0700 05/31 0701 - 06/01 0700   P.O.     I.V. (mL/kg) 1394 (16) 100 (1.2)   IV Piggyback 525    Total Intake(mL/kg) 1919 (22.1) 100 (1.2)   Urine (mL/kg/hr) 2525 (1.2) 300 (0.9)   Total Output  2525 300   Net -606 -200          PHYSICAL EXAMINATION: General: Pleasant male patient in no acute distress. ENT: Oropharynx clear. Moist mucous membranes. No thrush Heart: Normal S1, S2. No murmurs, rubs, or gallops appreciated. No bruits, equal pulses. Lungs: Normal excursion, no dullness to percussion. Good air movement bilaterally, mild exp wheeze  Normal upper airway sounds without evidence of stridor. Abdomen: Abdomen soft, non-tender and not distended, normoactive bowel sounds. No hepatosplenomegaly or masses. Musculoskeletal: No clubbing or synovitis. Mild increase in size of LLE. Skin: No rashes or lesions Neuro: No focal neurologic deficits.   LABS:  CBC  Recent Labs Lab 07/10/13 1014 07/11/13 0308 07/12/13 0245  WBC 14.8* 16.6* 23.1*  HGB 16.3 15.2 14.4  HCT 49.4 45.2 42.7  PLT 192 179 208   Coag's  Recent Labs Lab 07/10/13 1559  APTT 31  INR 1.16   BMET  Recent Labs Lab 07/10/13 1014 07/11/13 0308 07/12/13 0245  NA 140 140 142  K 4.7 4.8 4.7  CL 98 100 106  CO2 27 23 22   BUN 20 29* 30*  CREATININE 1.45* 1.52* 1.26  GLUCOSE 131* 171* 117*   Cardiac Enzymes  Recent Labs Lab 07/10/13 1014  TROPONINI <0.30  PROBNP 1349.0*   Glucose  Recent Labs Lab 07/10/13 1821  GLUCAP 169*    Imaging Ct Angio Chest W/cm &/or Wo Cm  07/10/2013   CLINICAL DATA:  Respiratory distress. Shortness of  breath. Left leg pain and swelling.  EXAM: CT ANGIOGRAPHY CHEST WITH CONTRAST  TECHNIQUE: Multidetector CT imaging of the chest was performed using the standard protocol during bolus administration of intravenous contrast. Multiplanar CT image reconstructions and MIPs were obtained to evaluate the vascular anatomy.  CONTRAST:  11mL OMNIPAQUE IOHEXOL 350 MG/ML SOLN  COMPARISON:  Radiography same day.  CT 06/06/2008.  FINDINGS: The study is positive for extensive bilateral pulmonary embolic disease, more massive on the right than the left. There is evidence of  right ventricular strain with the right ventricular to left ventricular ratio of 1.5.  There is atherosclerosis of the aorta and of the coronary arteries. No pleural or pericardial fluid.  In evaluating the lungs, there is considerable motion degradation. On the right, there is infiltrate in the middle lobe with volume loss consistent with pneumonia. Some patchy density is also present in the lower lobe. Just above the diaphragm in the right lower lobe, there is a nodule measuring 8.5 mm in diameter. There may be other smaller nodules but it is difficult to be certain given the motion degradation.  On the left, there is some scarring and atelectasis. Patchy infiltrate is possible. Focal nodules are difficult to accurately characterize given the degree of motion degradation. Particularly, in the peripheral left lower lobe image number 77 there is a 9.5 mm nodular shadow. On image 71 there is a 7.4 mm nodular shadow. On image 65 there is a 11 mm nodular shadow.  There are abnormal lymph nodes in the hilar and mediastinal regions. The largest nodes are at the aortopulmonary window on the left measuring 26 x 15 mm and in the left hilum measuring 26 x 17 mm. These are larger than were seen previously.  There is a background pattern of centrilobular emphysema.  Scans in the upper abdomen show chronic adrenal enlargement on the left consistent with a benign adenoma.  Review of the MIP images confirms the above findings.  IMPRESSION: Positive for acute PE with CT evidence of right heartstrain (RV/LV Ratio = 1.5) consistent with at least submassive (intermediate risk)PE. The presence of right heart strain has been associated with anincreased risk of morbidity and mortality. Consultation with [place] is recommended.  Extensive coronary artery calcification.  Abnormal density in the right middle lobe and to a lesser extent the right lower lobe that could be pneumonia. There is a 8.5 mm nodule in the  right lower lobe just above the diaphragm. The possibility of interstitial spread of cancer does exist.  The pulmonary images are degraded by motion. There are other areas that could represent nodules that are not precisely evaluated. There is worrisome nodal enlargement in the mediastinum and the hila.  Background pattern of centrilobular emphysema.  Critical Value/emergent results were called by telephone at the time of interpretation on 07/10/2013 at 3:46 PM to Dr. Rolland Porter , who verbally acknowledged these results.   Electronically Signed   By: Nelson Chimes M.D.   On: 07/10/2013 15:49   US Venous Img Lower Unilateral Left  07/10/2013   CLINICAL DATA:  Respiratory distress  EXAM: Left LOWER EXTREMITY VENOUS DOPPLER ULTRASOUND  TECHNIQUE: Gray-scale sonography with graded compression, as well as color Doppler and duplex ultrasound were performed to evaluate the lower extremity deep venous systems from the level of the common femoral vein and including the common femoral, femoral, profunda femoral, popliteal and calf veins including the posterior tibial, peroneal and gastrocnemius veins when visible. The superficial great saphenous  vein was also interrogated. Spectral Doppler was utilized to evaluate flow at rest and with distal augmentation maneuvers in the common femoral, femoral and popliteal veins.  COMPARISON:  None.  FINDINGS: Common Femoral Vein: No evidence of thrombus. Normal compressibility, respiratory phasicity and response to augmentation.  Saphenofemoral Junction: No evidence of thrombus. Normal compressibility and flow on color Doppler imaging.  Profunda Femoral Vein: No evidence of thrombus. Normal compressibility and flow on color Doppler imaging.  Femoral Vein: Deep venous thrombosis is noted.  Popliteal Vein: Deep venous thrombosis is noted.  Calf Veins: Deep venous thrombosis is noted in the posterior tibial, peroneal and anterior tibial veins.  Superficial Great Saphenous Vein: No evidence  of thrombus. Normal compressibility and flow on color Doppler imaging.  Venous Reflux:  None.  Other Findings:  None.  IMPRESSION: Diffuse left leg deep venous thrombosis.   Electronically Signed   By: Inez Catalina M.D.   On: 07/10/2013 15:10   Dg Chest Port 1 View  07/12/2013   CLINICAL DATA:  Endotracheal to check  EXAM: PORTABLE CHEST - 1 VIEW  COMPARISON:  07/10/2013  FINDINGS: No endotracheal tube is present. Right middle lobe airspace disease concerning for pneumonia. Hazy left lower lobe airspace disease which may reflect atelectasis versus developing pneumonia. No pneumothorax or pleural effusion. Stable cardiomediastinal silhouette.  No acute osseous abnormality.  IMPRESSION: 1. Right middle lobe pneumonia.  No significant interval change. 2. Hazy left lower lobe airspace disease which may reflect atelectasis versus pneumonia.   Electronically Signed   By: Kathreen Devoid   On: 07/12/2013 08:39     ASSESSMENT / PLAN:  Submassive PE/ LLE DVT long distance travel (7hrs) about a month ago and recent cystoscopy but not in bed for a long period of time. Patient with mediastinal adenopathy and a RLL nodule (8.63mm) with history of heavy smoking. Plan  cont xarelto   Compression hose  Mobilize   Will need f/u echo eventually   Possible HCAP-->CXR improved  Possible COPD Plan  Continue empiric coverage-->switch to oral   Wean Fio2  Continue BDs  F/u cxr 6/1  Mediastinal adenopathy, RLL 8.5 mm nodule and history of heavy smoking. Really concerning for malignancy. Will need further workup. Plan:    - Will need further workup for mediastinal adenopathy and RLL nodule >> will delay for until more stable   Acute renal failure, likely pre renal. (was on lasix recently)-->better Plan:    Continue hold lasix  NSL IVF  F/u chem in am    07/12/2013, 11:03 AM    Hemodynamically stable.  Discussed different options for oral anti-coagulation.  He is agreeable to proceed with xarelto.  Change  to levaquin oral re: abx. Move to [facility]   Reviewed above, and examined.  Will transfer to med-surg bed.  Likely ready for d/c home in next 24 to 48 hours.  Chesley Mires, MD [facility] Pulmonary/Critical Care 07/12/2013, 12:17 PM Pager:  [phone] After 3pm call: [phone]

## 2013-07-12 NOTE — Discharge Instructions (Addendum)
Information on my medicine - XARELTO (rivaroxaban)  This medication education was reviewed with me or my healthcare representative as part of my discharge preparation.  The pharmacist that spoke with me during my hospital stay was:  Oney Folz Amend, [place]? Xarelto was prescribed to treat blood clots that may have been found in the veins of your legs (deep vein thrombosis) or in your lungs (pulmonary embolism) and to reduce the risk of them occurring again.  What do you need to know about Xarelto? The starting dose is one 15 mg tablet taken TWICE daily with food for the FIRST 21 DAYS then on June 21  the dose is changed to one 20 mg tablet taken ONCE A DAY with your evening meal.  DO NOT stop taking Xarelto without talking to the health care provider who prescribed the medication.  Refill your prescription for 20 mg tablets before you run out.  After discharge, you should have regular check-up appointments with your healthcare provider that is prescribing your Xarelto.  In the future your dose may need to be changed if your kidney function changes by a significant amount.  What do you do if you miss a dose? If you are taking Xarelto TWICE DAILY and you miss a dose, take it as soon as you remember. You may take two 15 mg tablets (total 30 mg) at the same time then resume your regularly scheduled 15 mg twice daily the next day.  If you are taking Xarelto ONCE DAILY and you miss a dose, take it as soon as you remember on the same day then continue your regularly scheduled once daily regimen the next day. Do not take two doses of Xarelto at the same time.   Important Safety Information Xarelto is a blood thinner medicine that can cause bleeding. You should call your healthcare provider right away if you experience any of the following:   Bleeding from an injury or your nose that does not stop.   Unusual colored urine (red or dark brown) or unusual  colored stools (red or black).   Unusual bruising for unknown reasons.   A serious fall or if you hit your head (even if there is no bleeding).  Some medicines may interact with Xarelto and might increase your risk of bleeding while on Xarelto. To help avoid this, consult your healthcare provider or pharmacist prior to using any new prescription or non-prescription medications, including herbals, vitamins, non-steroidal anti-inflammatory drugs (NSAIDs) and supplements.  This website has more information on Xarelto: [email or web address].[email or web address]    Pulmonary Embolus A pulmonary (lung) embolus (PE) is a blood clot that has traveled from another place in the body to the lung. Most clots come from deep veins in the legs or pelvis. PE is a dangerous and potentially life-threatening condition that can be treated if identified. CAUSES Blood clots form in a vein for different reasons. Usually several things cause blood clots. They include:  The flow of blood slows down.  The inside of the vein is damaged in some way.  The person has a condition that makes the blood clot more easily. These conditions may include:  Older age (especially over 3 years old).  Having a history of blood clots.  Having major or lengthy surgery. Hip surgery is particularly high-risk.  Breaking a hip or leg.  Sitting or lying still for a long time.  Cancer or cancer treatment.  Having a long, thin tube (catheter) placed inside a  vein during a medical procedure.  Being overweight (obese).  Pregnancy and childbirth.  Medicines with estrogen.  Smoking.  Other circulation or heart problems. SYMPTOMS  The symptoms of a PE usually start suddenly and include:  Shortness of breath.  Coughing.  Coughing up blood or blood-tinged mucus (phlegm).  Chest pain. Pain is often worse with deep breaths.  Rapid heartbeat. DIAGNOSIS  If a PE is suspected, your caregiver will take a medical history and carry out a physical  exam. Your caregiver will check for the risk factors listed above. Tests that also may be required include:  Blood tests, including studies of the clotting properties of your blood.  Imaging tests. Ultrasound, CT, MRI, and other tests can all be used to see if you have clots in your legs or lungs. If you have a clot in your legs and have breathing or chest problems, your caregiver may conclude that you have a clot in your lungs. Further lung tests may not be needed.  Electrocardiography can look for heart strain from blood clots in the lungs. PREVENTION   Exercise the legs regularly. Take a brisk 30 minute walk every day.  Maintain a weight that is appropriate for your height.  Avoid sitting or lying in bed for long periods of time without moving your legs.  Women, particularly those over the age of 62, should consider the risks and benefits of taking estrogen medicines, including birth control pills.  Do not smoke, especially if you take estrogen medicines.  Long-distance travel can increase your risk. You should exercise your legs by walking or pumping the muscles every hour.  In hospital prevention:  Your caregiver will assess your need for preventive PE care (prophylaxis) when you are admitted to the hospital. If you are having surgery, your surgeon will assess you the day of or day after surgery.  Prevention may include medical and nonmedical measures. TREATMENT   The most common treatment for a PE is blood thinning (anticoagulant) medicine, which reduces the blood's tendency to clot. Anticoagulants can stop new blood clots from forming and old ones from growing. They cannot dissolve existing clots. Your body does this by itself over time. Anticoagulants can be given by mouth, by intravenous (IV) access, or by injection. Your caregiver will determine the best program for you.  Less commonly, clot-dissolving drugs (thrombolytics) are used to dissolve a PE. They carry a high risk of  bleeding, so they are used mainly in severe cases.  Very rarely, a blood clot in the leg needs to be removed surgically.  If you are unable to take anticoagulants, your caregiver may arrange for you to have a filter placed in a main vein in your abdomen. This filter prevents clots from traveling to your lungs. HOME CARE INSTRUCTIONS   Take all medicines prescribed by your caregiver. Follow the directions carefully.  Warfarin. Most people will continue taking warfarin after hospital discharge. Your caregiver will advise you on the length of treatment (usually 3 6 months, sometimes lifelong).  Too much and too little warfarin are both dangerous. Too much warfarin increases the risk of bleeding. Too little warfarin continues to allow the risk for blood clots. While taking warfarin, you will need to have regular blood tests to measure your blood clotting time. These blood tests usually include both the prothrombin time (PT) and International Normalized Ratio (INR) tests. The PT and INR results allow your caregiver to adjust your dose of warfarin. The dose can change for many reasons. It  is critically important that you take warfarin exactly as prescribed, and that you have your PT and INR levels drawn exactly as directed.  Many foods, especially foods high in vitamin K can interfere with warfarin and affect the PT and INR results. Foods high in vitamin K include spinach, kale, broccoli, cabbage, collard and turnip greens, brussels sprouts, peas, cauliflower, seaweed, and parsley as well as beef and pork liver, green tea, and soybean oil. You should eat a consistent amount of foods high in vitamin K. Avoid major changes in your diet, or notify your caregiver before changing your diet. Arrange a visit with a dietitian to answer your questions.  Many medicines can interfere with warfarin and affect the PT and INR results. You must tell your caregiver about any and all medicines you take, this includes all  vitamins and supplements. Be especially cautious with aspirin and anti-inflammatory medicines. Ask your caregiver before taking these. Do not take or discontinue any prescribed or over-the-counter medicine except on the advice of your caregiver or pharmacist.  Warfarin can have side effects, such as excessive bruising or bleeding. You will need to hold pressure over cuts for longer than usual.  Alcohol can change the body's ability to handle warfarin. It is best to avoid alcoholic drinks or consume only very small amounts while taking warfarin. Notify your caregiver if you change your alcohol intake.  Notify your dentist or other caregivers before procedures.  Avoid contact sports.  Wear a medical alert bracelet or carry a medical alert card.  Ask your caregiver how soon you can go back to normal activities. Not being active can lead to new clots. Ask for a list of what you should and should not do.  Compression stockings. These are tight elastic stockings that apply pressure to the lower legs. This can help keep the blood in the legs from clotting. You may need to wear compressions stockings at home to help prevent clots.  Smoking. If you smoke, quit. Ask your caregiver for help with quitting smoking.  Learn as much as you can about PE. Educating yourself can help prevent PE from reoccurring. SEEK MEDICAL CARE IF:   You notice a rapid heartbeat.  You feel weaker or more tired than usual.  You feel faint.  You notice increased bruising.  Your symptoms are not getting better in the time expected.  You are having side effects of medicine. SEEK IMMEDIATE MEDICAL CARE IF:   You have chest pain.  You have trouble breathing.  You have new or increased swelling or pain in one leg.  You cough up blood.  You notice blood in vomit, in a bowel movement, or in urine.  You have an oral temperature above 102 F (38.9 C), not controlled by medicine. You may have another PE. A blood  clot in the lungs is a medical emergency. Call your local emergency services (911 in [place] to get to the nearest hospital or clinic. Do not drive yourself. MAKE SURE YOU:   Understand these instructions.  Will watch your condition.  Will get help right away if you are not doing well or get worse. Document Released: 01/27/2000 Document Revised: 07/31/2011 Document Reviewed: 08/02/2008 [facility] Patient Information 2014 [place], [place].

## 2013-07-13 ENCOUNTER — Inpatient Hospital Stay (HOSPITAL_COMMUNITY): Payer: Medicare Other

## 2013-07-13 LAB — CULTURE, RESPIRATORY W GRAM STAIN

## 2013-07-13 LAB — COMPREHENSIVE METABOLIC PANEL
ALT: 32 U/L (ref 0–53)
AST: 23 U/L (ref 0–37)
Albumin: 2.6 g/dL — ABNORMAL LOW (ref 3.5–5.2)
Alkaline Phosphatase: 142 U/L — ABNORMAL HIGH (ref 39–117)
BUN: 26 mg/dL — AB (ref 6–23)
CALCIUM: 9.1 mg/dL (ref 8.4–10.5)
CO2: 26 mEq/L (ref 19–32)
Chloride: 105 mEq/L (ref 96–112)
Creatinine, Ser: 1.36 mg/dL — ABNORMAL HIGH (ref 0.50–1.35)
GFR calc non Af Amer: 52 mL/min — ABNORMAL LOW (ref >90)
GFR, EST AFRICAN AMERICAN: 60 mL/min — AB (ref >90)
GLUCOSE: 98 mg/dL (ref 70–99)
Potassium: 4.7 mEq/L (ref 3.7–5.3)
Sodium: 143 mEq/L (ref 137–147)
TOTAL PROTEIN: 6.6 g/dL (ref 6.0–8.3)
Total Bilirubin: 0.5 mg/dL (ref 0.3–1.2)

## 2013-07-13 LAB — CBC
HEMATOCRIT: 45.3 % (ref 39.0–52.0)
HEMOGLOBIN: 14.9 g/dL (ref 13.0–17.0)
MCH: 30.4 pg (ref 26.0–34.0)
MCHC: 32.9 g/dL (ref 30.0–36.0)
MCV: 92.4 fL (ref 78.0–100.0)
Platelets: 221 10*3/uL (ref 150–400)
RBC: 4.9 MIL/uL (ref 4.22–5.81)
RDW: 15.3 % (ref 11.5–15.5)
WBC: 12.5 10*3/uL — ABNORMAL HIGH (ref 4.0–10.5)

## 2013-07-13 LAB — CULTURE, RESPIRATORY: CULTURE: NORMAL

## 2013-07-13 MED ORDER — RIVAROXABAN 20 MG PO TABS: 20.0000 mg | ORAL_TABLET | Freq: Every day | ORAL | Status: DC

## 2013-07-13 MED ORDER — BUDESONIDE-FORMOTEROL FUMARATE 160-4.5 MCG/ACT IN AERO: 2.0000 | INHALATION_SPRAY | Freq: Two times a day (BID) | RESPIRATORY_TRACT | Status: AC

## 2013-07-13 MED ORDER — RIVAROXABAN 15 MG PO TABS: 15.0000 mg | ORAL_TABLET | Freq: Two times a day (BID) | ORAL | Status: DC

## 2013-07-13 MED ORDER — MUPIROCIN 2 % EX OINT
1.0000 | TOPICAL_OINTMENT | Freq: Two times a day (BID) | CUTANEOUS | Status: AC
Start: 2013-07-13 — End: 2013-07-17

## 2013-07-13 MED ORDER — LEVOFLOXACIN 750 MG PO TABS: 750.0000 mg | ORAL_TABLET | Freq: Every day | ORAL | Status: DC

## 2013-07-13 NOTE — Discharge Summary (Signed)
Physician Discharge Summary  Patient ID: Jonathan Goodwin MRN: [id number] DOB/AGE: 69-May-1947 69 y.o.  Admit date: 07/10/2013 Discharge date: 07/13/2013    Discharge Diagnoses:  Submassive Pulmonary Embolism LLE DVT Possible HCAP Likely COPD Tobacco Abuse Mediastinal Adenopathy RLL 8.5 mm Pulmonary Nodule Acute Renal Failure Hx Prostate CA                                                                      DISCHARGE PLAN BY DIAGNOSIS     Submassive Pulmonary Embolism LLE DVT Possible HCAP Likely COPD Tobacco Abuse Mediastinal Adenopathy RLL 8.5 mm Pulmonary Nodule  Discharge Plan: Continue Xarelto as below, will transition to once daily dosing on 6/21 Smoking cessation Will need repeat CT chest to follow up pulmonary nodule vs EBUS with TBNA Continue symbicort at discharge (new) Complete 7 days total antibiotics Counseled on smoking cessation Consider follow up CXR at time of visit to review unchanged RML, LLL opacities to ensure no evolving post infarct infection Complete MRSA treatment  Will need PFT's once over acute phase of illness  Acute Renal Failure Hx Prostate CA  Discharge Plan: Hold HCTZ given resolving ARF.   Follow up with PCP for consideration for restart HCTZ Follow up BMP to review sr cr at time of office visit                    DISCHARGE SUMMARY   Jonathan Goodwin is a 68 y.o. y/o male, current smoker,  with a PMH of UC, renal calculi, Macular Degeneration, prostate cancer s/p radiation and urethral stricture post cystoscopic urethrotomy who presented to [facility] ER on 5/29 with complaints of a two week history of LLE swelling, worsening SOB, cough without sputum production, & chest soreness.  On 5/19 the patient was seen at [facility] for outpatient cystoscopy.  He had recently been seen at [facility] on 5/20 and was diagnosed with bilateral pneumonia on CXR.  He was prescribed an antibiotic which he took as instructed,  clarithromycin 500 mg tid for 14 days. Symptoms persisted and he was seen again on 5/22 was treated for CHF with lasix which he took for 4 days  Patient did not improve and sought evaluation in the ER.  Work up noted a CTA positive for large bilateral PE's R> L with evidence of right heart strain on CT and elevated BNP but negative troponin. Of note, the CTA also showed significant mediastinal adenopathy and a 8.5 mm RLL nodule. PE thought to be unprovoked except for some long distance travel (7hrs) about a month ago and recent cystoscopy but not in bed for a long period of time.  Patient was transferred to [facility] for further evaluation.  He was hemodynamically stable on admission saturating 96% on 2 L [place]. He was not a candidate for TPA upon admission.  Venous duplex of LE's were positive for LLE DVT.  ECHO on 5/31 revealed an estimated EF of 93-57%, grade 1 diastolic dysfunction, moderately dilated RV/RA and PA peak pressure of 55.  Patient was empirically treated for PNA (HCAP, no organism specified) with anti-biotics as below for bilateral infiltrates.  However, 'infiltrates' may actually be pulmonary infarct.  He was treated with IV heparin and  transitioned to Xarelto for ongoing anticoagulation needs.    Patient was also noted on admission to have acute renal failure.  He was treated with IV fluids and further lasix held with return of sr cr to near normal at time of discharge.               SIGNIFICANT EVENTS / STUDIES:  5/29 - CTA >> Large bilateral PE's  5/29 - Doppler legs >> DVT Lt leg  5/31 - Echo >> Wall thickness was increased in a pattern of mild LVH. Systolic function was normal.  EF of 55% to 60%.Doppler parameters are consistent with abnormal left ventricular relaxation (grade 1 diastolic dysfunction).  Right ventricle: The cavity size was moderately dilated. Wall thickness was normal.- Right atrium: The atrium was moderately dilated.- Pulmonary arteries: PA peak pressure: 55 mm Hg  (S).   ANTIBIOTICS Zosyn 5/29>>> 5/31  Vancomycin 5/29>>> 5/31  Levaquin 5/31 >>> x 3 more days post d/c   CULTURES MRSA PCA 5/29 >> positive Sputum 5/30 >> nml flora BCx2 5/29 >> pending at time of discharge    Discharge Exam: General: Pleasant male patient in no acute distress.  ENT: Oropharynx clear. Moist mucous membranes. No thrush  Heart: Normal S1, S2. No murmurs, rubs, or gallops appreciated. No bruits, equal pulses.  Lungs: Normal excursion, no dullness to percussion. Good air movement bilaterally, mild exp wheeze  Normal upper airway sounds without evidence of stridor.  Abdomen: Abdomen soft, non-tender and not distended, normoactive bowel sounds.  Musculoskeletal: No clubbing or synovitis. Mild increase in size of LLE.  Skin: No rashes or lesions  Neuro: No focal neurologic deficits   Filed Vitals:   07/12/13 1450 07/12/13 2106 07/12/13 2118 07/13/13 0447  BP: 108/73  113/76 115/77  Pulse: 61 63 64 65  Temp: 97.5 F (36.4 C)  98.5 F (36.9 C) 98.6 F (37 C)  TempSrc: Oral  Oral Oral  Resp: $Remo'17 16 17 18  'xEVkw$ Height:      Weight:    192 lb 8 oz (87.317 kg)  SpO2: 93% 94% 92% 94%     Discharge Labs  BMET  Recent Labs Lab 07/10/13 1014 07/11/13 0308 07/12/13 0245 07/13/13 0402  NA 140 140 142 143  K 4.7 4.8 4.7 4.7  CL 98 100 106 105  CO2 $Re'27 23 22 26  'BKJ$ GLUCOSE 131* 171* 117* 98  BUN 20 29* 30* 26*  CREATININE 1.45* 1.52* 1.26 1.36*  CALCIUM 9.5 8.6 8.6 9.1   CBC  Recent Labs Lab 07/11/13 0308 07/12/13 0245 07/13/13 0402  HGB 15.2 14.4 14.9  HCT 45.2 42.7 45.3  WBC 16.6* 23.1* 12.5*  PLT 179 208 221   Anti-Coagulation  Recent Labs Lab 07/10/13 1559  INR 1.16      Medication List    STOP taking these medications       clarithromycin 500 MG tablet  Commonly known as:  BIAXIN     hydrochlorothiazide 12.5 MG tablet  Commonly known as:  HYDRODIURIL      TAKE these medications       budesonide-formoterol 160-4.5 MCG/ACT  inhaler  Commonly known as:  SYMBICORT  Inhale 2 puffs into the lungs 2 (two) times daily.     levofloxacin 750 MG tablet  Commonly known as:  LEVAQUIN  Take 1 tablet (750 mg total) by mouth daily.  Start taking on:  07/14/2013     mupirocin ointment 2 %  Commonly known as:  [place] 1 application  into the nose 2 (two) times daily.     Rivaroxaban 15 MG Tabs tablet  Commonly known as:  XARELTO  Take 1 tablet (15 mg total) by mouth 2 (two) times daily with a meal.     rivaroxaban 20 MG Tabs tablet  Commonly known as:  XARELTO  Take 1 tablet (20 mg total) by mouth daily with supper.  Start taking on:  08/02/2013        Follow-up Information   Follow up with Barbie Haggis, FNP. (As needed)    Specialty:  Nurse Practitioner   Contact information:   [address] [place] [address] [phone]       Follow up with PARRETT,TAMMY, NP On 07/24/2013. (Appt at 9:30 AM.  Arrive at 9:15 for lab work.  )    Specialty:  Nurse Practitioner   Contact information:   [place] [place] [address] [phone]        Disposition: Home, no home health needs identified at time of discharge.   Discharged Condition: Jonathan Goodwin has met maximum benefit of inpatient care and is medically stable and cleared for discharge.  Patient is pending follow up as above.      Time spent on disposition:  Greater than 35 minutes.   Signed: Noe Gens, NP-C [facility] Pgr: [phone] Office: [phone]

## 2013-07-13 NOTE — Progress Notes (Signed)
IV and tele monitor d/c per MD order; pt to d/c home with wife; NP currently working on d/c orders; will cont. To monitor.

## 2013-07-13 NOTE — Progress Notes (Signed)
CARE MANAGEMENT NOTE 07/13/2013  Patient:  MELBURN, TREIBER   Account Number:  [id number]  Date Initiated:  07/13/2013  Documentation initiated by:  [facility]  Subjective/Objective Assessment:   Submassive Pulmonary Embolism     Action/Plan:   lives with wife   Anticipated DC Date:  07/13/2013   Anticipated DC Plan:  [place]  CM consult      Choice offered to / List presented to:             Status of service:   Medicare Important Message given?  YES (If response is "NO", the following Medicare IM given date fields will be blank) Date Medicare IM given:  07/10/2013 Date Additional Medicare IM given:  07/13/2013  Discharge Disposition:  HOME/SELF CARE  Per UR Regulation:    If discussed at Long Length of Stay Meetings, dates discussed:    Comments:  07/13/2013 1700 NCM spoke to pt and states they were having difficulty with getting Xarelto go through with free 30 day card. NCM contacted [facility] and spoke to pharmacist and they were able to get the pt 20 days for free. They will send prior auth to physician to complete for Rx.  Attempted call to [facility] but unable to get through to complete prior auth. Jonnie Finner RN CCM Case Mgmt phone [phone]

## 2013-07-13 NOTE — Progress Notes (Signed)
Pt ambulated on RA; O2 sats 93-95% on RA while ambulating; pt denies SOB; pt also denies CP; will cont. To monitor.

## 2013-07-13 NOTE — Progress Notes (Addendum)
PULMONARY / CRITICAL CARE MEDICINE   Name: Jonathan Goodwin MRN: [id number] DOB: 25-Jan-1946   PCP Barbie Haggis, FNP   ADMISSION DATE:  07/10/2013  PRIMARY SERVICE: PCCM  CHIEF COMPLAINT:  PE  BRIEF PATIENT DESCRIPTION:  68 years old male with PMH relevant for prostate cancer s/p radiation and urethral stricture post cystoscopic urethrotomy. Presents with two weeks of left LE swelling and about a week of worsening SOB and cough. CTA positive for large bilateral PE's R> L with evidence of right heart strain on CT and elevated BNP but negative troponin. Hemodynamically stable saturating 96% on 2 L [place].   SIGNIFICANT EVENTS / STUDIES:  CTA 5/29: Large bilateral PE's Doppler legs 5/29: DVT Lt leg Echo 5/31>>>Wall thickness was increased in a pattern of mild LVH. Systolic function was normal. The estimated ejection fraction was in the range of 55% to 60%.Doppler parameters are consistent with abnormal left ventricular relaxation (grade 1 diastolic dysfunction). - Right ventricle: The cavity size was moderately dilated. Wallthickness was normal.- Right atrium: The atrium was moderately dilated.- Pulmonary arteries: PA peak pressure: 55 mm Hg (S).    LINES / TUBES: - Peripheral IV's  CULTURES: - Sputum culture ordered Results for orders placed during the hospital encounter of 07/10/13  CULTURE, BLOOD (ROUTINE X 2)     Status: None   Collection Time    07/10/13 11:10 AM      Result Value Ref Range Status   Specimen Description Blood   Final   Special Requests Normal   Final   Culture NO GROWTH 3 DAYS   Final   Report Status PENDING   Incomplete  CULTURE, BLOOD (ROUTINE X 2)     Status: None   Collection Time    07/10/13 11:15 AM      Result Value Ref Range Status   Specimen Description Blood   Final   Special Requests Normal   Final   Culture NO GROWTH 3 DAYS   Final   Report Status PENDING   Incomplete  MRSA PCR SCREENING     Status: Abnormal   Collection Time    07/10/13   6:26 PM      Result Value Ref Range Status   MRSA by PCR POSITIVE (*) NEGATIVE Final   Comment:            The GeneXpert MRSA Assay (FDA     approved for NASAL specimens     only), is one component of a     comprehensive MRSA colonization     surveillance program. It is not     intended to diagnose MRSA     infection nor to guide or     monitor treatment for     MRSA infections.     RESULT CALLED TO, READ BACK BY AND VERIFIED WITH:     M.HOPPER ,RN AT 2230 BY L.PITT 07/10/13  CULTURE, EXPECTORATED SPUTUM-ASSESSMENT     Status: None   Collection Time    07/11/13 12:16 AM      Result Value Ref Range Status   Specimen Description SPUTUM   Final   Special Requests NONE   Final   Sputum evaluation     Final   Value: THIS SPECIMEN IS ACCEPTABLE. RESPIRATORY CULTURE REPORT TO FOLLOW.   Report Status 07/11/2013 FINAL   Final  CULTURE, RESPIRATORY (NON-EXPECTORATED)     Status: None   Collection Time    07/11/13 12:16 AM      Result Value  Ref Range Status   Specimen Description SPUTUM   Final   Special Requests NONE   Final   Gram Stain     Final   Value: FEW WBC PRESENT, PREDOMINANTLY MONONUCLEAR     FEW SQUAMOUS EPITHELIAL CELLS PRESENT     NO ORGANISMS SEEN     Performed at [facility]   Culture     Final   Value: NORMAL OROPHARYNGEAL FLORA     Performed at [facility]   Report Status 07/13/2013 FINAL   Final     ANTIBIOTICS: - Zosyn 5/29>>> 5/31 - Vancomycin 5/29>>> 5/31 - Levaquin 5/31 >>>  Anti-infectives   Start     Dose/Rate Route Frequency Ordered Stop   07/12/13 1445  levofloxacin (LEVAQUIN) tablet 750 mg  Status:  Discontinued     750 mg Oral Daily 07/12/13 1432 07/13/13 1045   07/12/13 1300  levofloxacin (LEVAQUIN) tablet 750 mg     750 mg Oral Daily 07/12/13 1224     07/10/13 2200  vancomycin (VANCOCIN) IVPB 750 mg/150 ml premix  Status:  Discontinued     750 mg 150 mL/hr over 60 Minutes Intravenous Every 12 hours 07/10/13 2121 07/12/13  1224   07/10/13 2200  piperacillin-tazobactam (ZOSYN) IVPB 3.375 g  Status:  Discontinued     3.375 g 12.5 mL/hr over 240 Minutes Intravenous Every 8 hours 07/10/13 2121 07/12/13 1224   07/10/13 1100  vancomycin (VANCOCIN) IVPB 1000 mg/200 mL premix     1,000 mg 200 mL/hr over 60 Minutes Intravenous  Once 07/10/13 1048 07/10/13 1257   07/10/13 1100  piperacillin-tazobactam (ZOSYN) IVPB 3.375 g     3.375 g 100 mL/hr over 30 Minutes Intravenous  Once 07/10/13 1048 07/10/13 1349      SUBJECTIVE:  Denies chest pain, dyspnea. Says ready to go home. Feels well. Walked with RN and says did well; did not desaturate.   VITAL SIGNS: Temp:  [97.5 F (36.4 C)-98.6 F (37 C)] 98.6 F (37 C) (06/01 0447) Pulse Rate:  [61-65] 65 (06/01 0447) Resp:  [16-18] 18 (06/01 0447) BP: (108-115)/(73-77) 115/77 mmHg (06/01 0447) SpO2:  [92 %-94 %] 94 % (06/01 0447) Weight:  [87.317 kg (192 lb 8 oz)] 87.317 kg (192 lb 8 oz) (06/01 0447) 3 liters   INTAKE / OUTPUT: Intake/Output     05/31 0701 - 06/01 0700 06/01 0701 - 06/02 0700   P.O. 480 240   I.V. (mL/kg) 100 (1.1)    IV Piggyback     Total Intake(mL/kg) 580 (6.6) 240 (2.7)   Urine (mL/kg/hr) 1000 (0.5)    Total Output 1000     Net -420 +240        Stool Occurrence 1 x      PHYSICAL EXAMINATION: General: Pleasant male patient in no acute distress. ENT: Oropharynx clear. Moist mucous membranes. No thrush Heart: Normal S1, S2. No murmurs, rubs, or gallops appreciated. No bruits, equal pulses. Lungs: Normal excursion, no dullness to percussion. Good air movement bilaterally, mild exp wheeze  Normal upper airway sounds without evidence of stridor. Abdomen: Abdomen soft, non-tender and not distended, normoactive bowel sounds. No hepatosplenomegaly or masses. Musculoskeletal: No clubbing or synovitis. Mild increase in size of LLE. Skin: No rashes or lesions Neuro: No focal neurologic deficits.   LABS:  PULMONARY No results found for this  basename: PHART, PCO2, PCO2ART, PO2, PO2ART, HCO3, TCO2, O2SAT,  in the last 168 hours  CBC  Recent Labs Lab 07/11/13 0308 07/12/13 0245 07/13/13  0402  HGB 15.2 14.4 14.9  HCT 45.2 42.7 45.3  WBC 16.6* 23.1* 12.5*  PLT 179 208 221    COAGULATION  Recent Labs Lab 07/10/13 1559  INR 1.16    CARDIAC   Recent Labs Lab 07/10/13 1014  TROPONINI <0.30    Recent Labs Lab 07/10/13 1014  PROBNP 1349.0*     CHEMISTRY  Recent Labs Lab 07/10/13 1014 07/11/13 0308 07/12/13 0245 07/13/13 0402  NA 140 140 142 143  K 4.7 4.8 4.7 4.7  CL 98 100 106 105  CO2 27 23 22 26   GLUCOSE 131* 171* 117* 98  BUN 20 29* 30* 26*  CREATININE 1.45* 1.52* 1.26 1.36*  CALCIUM 9.5 8.6 8.6 9.1   Estimated Creatinine Clearance: 57.9 ml/min (by C-G formula based on Cr of 1.36).   LIVER  Recent Labs Lab 07/10/13 1559 07/12/13 0245 07/13/13 0402  AST  --  24 23  ALT  --  30 32  ALKPHOS  --  133* 142*  BILITOT  --  0.5 0.5  PROT  --  6.4 6.6  ALBUMIN  --  2.6* 2.6*  INR 1.16  --   --      INFECTIOUS No results found for this basename: LATICACIDVEN, PROCALCITON,  in the last 168 hours   ENDOCRINE CBG (last 3)   Recent Labs  07/10/13 1821  GLUCAP 169*         IMAGING x48h  Dg Chest Port 1 View  07/13/2013   CLINICAL DATA:  Right middle lobe pneumonia  EXAM: PORTABLE CHEST - 1 VIEW  COMPARISON:  07/12/2013; 07/10/2013; chest CT -07/10/2013  FINDINGS: Grossly unchanged cardiac silhouette and mediastinal contours with atherosclerotic plaque within the thoracic aorta. The lungs remain hyperexpanded. Ill-defined heterogeneous interstitial opacities within the right mid and left lower lung are unchanged. No new focal airspace opacities. No pleural effusion or pneumothorax. No definite evidence of edema. Unchanged bones.  IMPRESSION: Grossly unchanged ill-defined heterogeneous interstitial opacities within the right mid and left lower lung, possibly infection, though in the  presence of known pulmonary embolism, could represent areas of evolving pulmonary infarct. No new focal airspace opacities.   Electronically Signed   By: Sandi Mariscal M.D.   On: 07/13/2013 08:05   Dg Chest Port 1 View  07/12/2013   CLINICAL DATA:  Endotracheal to check  EXAM: PORTABLE CHEST - 1 VIEW  COMPARISON:  07/10/2013  FINDINGS: No endotracheal tube is present. Right middle lobe airspace disease concerning for pneumonia. Hazy left lower lobe airspace disease which may reflect atelectasis versus developing pneumonia. No pneumothorax or pleural effusion. Stable cardiomediastinal silhouette.  No acute osseous abnormality.  IMPRESSION: 1. Right middle lobe pneumonia.  No significant interval change. 2. Hazy left lower lobe airspace disease which may reflect atelectasis versus pneumonia.   Electronically Signed   By: Kathreen Devoid   On: 07/12/2013 08:39      ASSESSMENT / PLAN:  Submassive PE/ LLE DVT long distance travel (7hrs) about a month ago and recent cystoscopy but not in bed for a long period of time. Patient with mediastinal adenopathy and a RLL nodule (8.57mm) with history of heavy smoking. Plan  cont xarelto   Compression hose  Mobilize   Will need f/u echo eventually   Possible HCAP-->CXR improved  Possible COPD Plan  Continue empiric coverage-->switch to oral   Wean Fio2  Continue BDs  Will need folluwup imaging as outpatient  Mediastinal adenopathy, RLL 8.5 mm nodule and history of heavy  smoking. Really concerning for malignancy. Will need further workup. Plan:    - Will need further workup for mediastinal adenopathy and RLL nodule >> will delay for until more stable   Acute renal failure, likely pre renal. (was on lasix recently)--> unchanged, mildly impaired . gFR currently ok for xarelto Plan:    Continue hold lasix     GLOBAL 07/13/13: insistent on going home today. Ok to do so as well. Agress to follow with [facility]; will need followup < 2 weeks. Will need BMET  at followup to ensure GFR ok for xarelto  Dr. Brand Males, M.D., [facility].C.P Pulmonary and Critical Care Medicine Staff Physician [place] [facility] Pager: [phone], If no answer or between  15:00h - 7:00h: call [phone]  07/13/2013 12:12 PM

## 2013-07-13 NOTE — Progress Notes (Signed)
Pt and wife given d/c instructions; both verbalized understanding.

## 2013-07-15 ENCOUNTER — Telehealth: Payer: Self-pay | Admitting: *Deleted

## 2013-07-15 LAB — CULTURE, BLOOD (ROUTINE X 2)
CULTURE: NO GROWTH
Culture: NO GROWTH

## 2013-07-15 NOTE — Telephone Encounter (Signed)
[phone] ID # [id number] Xarelto 20 1 tab QD Xarelto 15 1 tab BID This was approved until 07/16/14 Approval # [id number] Called spoke Crystal from [facility]. Made aware of approval. Nothing further needed

## 2013-07-20 NOTE — Discharge Summary (Signed)
Staff note  - supervised discharge. See my progress note  Dr. Brand Males, M.D., [facility].C.P Pulmonary and Critical Care Medicine Staff Physician [place] [facility] Pager: [phone], If no answer or between  15:00h - 7:00h: call [phone]  07/20/2013 1:26 AM

## 2013-07-24 ENCOUNTER — Ambulatory Visit (INDEPENDENT_AMBULATORY_CARE_PROVIDER_SITE_OTHER)
Admission: RE | Admit: 2013-07-24 | Discharge: 2013-07-24 | Disposition: A | Discharge status: Home / Self Care | Payer: Medicare Other | Source: Ambulatory Visit | Attending: Adult Health | Admitting: Adult Health

## 2013-07-24 ENCOUNTER — Ambulatory Visit (INDEPENDENT_AMBULATORY_CARE_PROVIDER_SITE_OTHER): Payer: Medicare Other | Admitting: Adult Health

## 2013-07-24 ENCOUNTER — Inpatient Hospital Stay: Payer: Medicare Other | Admitting: Adult Health

## 2013-07-24 ENCOUNTER — Encounter: Payer: Self-pay | Admitting: Adult Health

## 2013-07-24 ENCOUNTER — Other Ambulatory Visit (INDEPENDENT_AMBULATORY_CARE_PROVIDER_SITE_OTHER): Payer: Medicare Other

## 2013-07-24 VITALS — BP 114/66 | HR 87 | Temp 97.8°F | Ht 73.0 in | Wt 201.0 lb

## 2013-07-24 DIAGNOSIS — R911 Solitary pulmonary nodule: Secondary | ICD-10-CM

## 2013-07-24 DIAGNOSIS — I2699 Other pulmonary embolism without acute cor pulmonale: Secondary | ICD-10-CM

## 2013-07-24 DIAGNOSIS — J189 Pneumonia, unspecified organism: Secondary | ICD-10-CM

## 2013-07-24 DIAGNOSIS — N179 Acute kidney failure, unspecified: Secondary | ICD-10-CM

## 2013-07-24 DIAGNOSIS — J449 Chronic obstructive pulmonary disease, unspecified: Secondary | ICD-10-CM | POA: Insufficient documentation

## 2013-07-24 LAB — BASIC METABOLIC PANEL
BUN: 17 mg/dL (ref 6–23)
CALCIUM: 9.2 mg/dL (ref 8.4–10.5)
CHLORIDE: 105 meq/L (ref 96–112)
CO2: 30 meq/L (ref 19–32)
Creatinine, Ser: 1.3 mg/dL (ref 0.4–1.5)
GFR: 57.63 mL/min — ABNORMAL LOW (ref >60.00)
Glucose, Bld: 82 mg/dL (ref 70–99)
Potassium: 5 mEq/L (ref 3.5–5.1)
Sodium: 142 mEq/L (ref 135–145)

## 2013-07-24 NOTE — Assessment & Plan Note (Addendum)
Submassive PE and LE DVT ? Provoked -long car ride, recent cystoscope, w/ +Right heart strain on echo >NOT TPA candidate>rx Hep/Xarelto transition This is the first PE /DVT , consider 9-12 mon therapy.  Consider Will need Repeat echo  And Repeat ven doppler in future (~6 month )   Pt is to continue on Xarelto  Date of transition therapy went over in detail with written and verbal instructions set for pt.  Pharmacy called to clarify rx dates.  Advised on signs to report of AE of Xarelto   Plan  Very important to follow Below directions for Xarelto  Take Xarelto 15mg  Twice daily  Until June 20th.  Begin Xarelto 20mg  daily with Supper on June 21st .   In future will need repeat CT chest  Most likely.  We will need to discuss possibility to repeat 2 D echo of heart and venous doppler of legs.  Work on not smoking  Follow up Dr. Chase Caller in 4 weeks with PFT  Please contact office for sooner follow up if symptoms do not improve or worsen or seek emergency care

## 2013-07-24 NOTE — Patient Instructions (Addendum)
Very important to follow Below directions for Xarelto  Take Xarelto 15mg  Twice daily  Until June 20th.  Begin Xarelto 20mg  daily with Supper on June 21st .  Chest xray today  Labs today  In future will need repeat CT chest  Most likely.  We will need to discuss possibility to repeat 2 D echo of heart and venous doppler of legs.  Work on not smoking  Continue on Symbicort 2 puffs Twice daily   Follow up Dr. Chase Caller in 4 weeks with PFT  Please contact office for sooner follow up if symptoms do not improve or worsen or seek emergency care

## 2013-07-24 NOTE — Assessment & Plan Note (Signed)
COPD  ,PFT pending  Advised on smoking cessation  Cont on Symbicort Twice daily   Check cxr today

## 2013-07-24 NOTE — Assessment & Plan Note (Signed)
Resolving prior to discharge Check bmet today  follow up with PCP as planned

## 2013-07-24 NOTE — Assessment & Plan Note (Addendum)
8.5 mm Lung nodule in RLL w/ associated hilar and medistinal adenopathy-need to watch closely as concern for possible underlying malignancy In setting on heavy smoker.  Will need follow up CT -~6 weeks  +/- consideration of PET vs Bx

## 2013-07-24 NOTE — Progress Notes (Signed)
Subjective:    Patient ID: Jonathan Goodwin, male    DOB: Jun 29, 1945, 68 y.o.   MRN: [id number]  HPI 68 y.o. y/o male, current smoker, with a PMH of UC, renal calculi, Macular Degeneration, prostate cancer s/p radiation and urethral stricture post cystoscopic urethrotomy who presented to [facility] ER on 5/29 transferred to [facility] for submassive PE and LE DVT. PCCM admit 07/10/13  07/24/2013 Post hospital follow up  Patient returns for a post hospital followup. Patient was admitted may 29 through June 1 for submassive pulmonary embolism, left lower extremity DVT, possible healthcare associated pneumonia, and likely, COPD, with notable, pulmonary nodule and mediastinal adenopathy on CT, complicated by acute renal failure. Hx of Prostate cancer s/p XRT 2009   On 5/19 outpatient cystoscopy. 5/20 diagnosed with bilateral pneumonia on CXR. He was prescribed an antibiotic clarithromycin 14 days.  Symptoms continue to worsen with dyspnea. Went to ER on 5/29 ER. Work up noted a CTA positive for large bilateral PE's R> L with evidence of right heart strain on CT and elevated BNP but negative troponin. Of note, the CTA also showed significant mediastinal adenopathy and a 8.5 mm RLL nodule. PE thought to be unprovoked except for some long distance travel (7hrs)  recent cystoscopy but not in bed for a long period of time.  Patient was transferred to [facility]  He was not a candidate for TPA upon admission. Venous duplex of LE's were positive for LLE DVT. ECHO on 5/31 revealed an estimated EF of 53-66%, grade 1 diastolic dysfunction, moderately dilated RV/RA and PA peak pressure of 55. Patient was empirically treated for PNA (HCAP, no organism specified) . He was treated with IV heparin and transitioned to Xarelto for ongoing anticoagulation needs.  Started on Xarelto 15mg  Twice daily  With plans to transition to Xarelto 20mg  daily on 6/21.   Patient was also noted on admission to have acute renal failure. He was  treated with IV fluids and further lasix held with return of sr cr to near normal at time of discharge.   Since discharge. Patient says he is some improvement however, continues to be weak and gets short of breath with walking, especially at, and [place]. Patient does continue to smoke, unfortunately. However, has cut back on his smoking. He has smoked 1 pack a day for approximately 55 years. He says he has had no formal workup for COPD. He was started on Symbicort, prior to discharge He is retired from Biomedical engineer, and roofing work. He denies any known exposure to asbestos.  Patient denies any hemoptysis, orthopnea, PND, abdominal pain, nausea, vomiting. Patient does have bilateral ankle edema. However, says that this is the same. Since discharge. Patient is currently on Xarelto, however misunderstood the instructions . He was given both Xarelto 15 and 20mg  tabs-taking 15mg  in am and 20mg  in pm.  We discussed the transition schedule and was given the time table to transition from Twice daily  Dosing to daily dosing . Wife and pt verbalize understanding, written instructions given as well.  Pharmacy has been called to advised on instructions.   Review of Systems Constitutional:   No  weight loss, night sweats,  Fevers, chills, + fatigue, or  lassitude.  HEENT:   No headaches,  Difficulty swallowing,  Tooth/dental problems, or  Sore throat,                No sneezing, itching, ear ache, nasal congestion, post nasal drip,   CV:  No chest  pain,  Orthopnea, PND, swelling in lower extremities, anasarca, dizziness, palpitations, syncope.   GI  No heartburn, indigestion, abdominal pain, nausea, vomiting, diarrhea, change in bowel habits, loss of appetite, bloody stools.   Resp: No  excess mucus, no productive cough,  No non-productive cough,  No coughing up of blood.  No change in color of mucus.  No wheezing.  No chest wall deformity  Skin: no rash or lesions.  GU: no dysuria, change in color of  urine, no urgency or frequency.  No flank pain, no hematuria   MS:  No joint pain or swelling.  No decreased range of motion.  No back pain.  Psych:  No change in mood or affect. No depression or anxiety.  No memory loss.         Objective:   Physical Exam GEN: A/Ox3; pleasant , NAD, elderly   HEENT:  [place]/AT,  EACs-clear, TMs-wnl, NOSE-clear, THROAT-clear, no lesions, no postnasal drip or exudate noted.   NECK:  Supple w/ fair ROM; no JVD; normal carotid impulses w/o bruits; no thyromegaly or nodules palpated; no lymphadenopathy.  RESP  Clear  P & A; w/o, wheezes/ rales/ or rhonchi.no accessory muscle use, no dullness to percussion  CARD:  RRR, no m/r/g  , tr-1+  peripheral edema, pulses intact, no cyanosis or clubbing.  GI:   Soft & nt; nml bowel sounds; no organomegaly or masses detected.  Musco: Warm bil, no deformities or joint swelling noted.   Neuro: alert, no focal deficits noted.    Skin: Warm, no lesions or rashes          Assessment & Plan:

## 2013-07-24 NOTE — Assessment & Plan Note (Signed)
Possible HCAP in setting of PE ? Infarct related +/- infection vs nodularity unknown  abx course finished   Plan   Repeat cxr today  Will most likely need CT chest in 4-6 weeks to follow this area .

## 2013-07-29 NOTE — Progress Notes (Signed)
Quick Note:  Results faxed to PCP via biscom ______

## 2013-08-05 ENCOUNTER — Telehealth: Payer: Self-pay | Admitting: Adult Health

## 2013-08-05 ENCOUNTER — Emergency Department (HOSPITAL_COMMUNITY): Payer: Medicare Other

## 2013-08-05 ENCOUNTER — Encounter (HOSPITAL_COMMUNITY): Payer: Self-pay | Admitting: Emergency Medicine

## 2013-08-05 ENCOUNTER — Inpatient Hospital Stay (HOSPITAL_COMMUNITY)
Admission: EM | Admit: 2013-08-05 | Discharge: 2013-08-09 | DRG: 193 | Disposition: A | Discharge status: Home / Self Care | Payer: Medicare Other | Attending: Family Medicine | Admitting: Family Medicine

## 2013-08-05 DIAGNOSIS — R0902 Hypoxemia: Secondary | ICD-10-CM

## 2013-08-05 DIAGNOSIS — T45515A Adverse effect of anticoagulants, initial encounter: Secondary | ICD-10-CM | POA: Diagnosis present

## 2013-08-05 DIAGNOSIS — Z86711 Personal history of pulmonary embolism: Secondary | ICD-10-CM

## 2013-08-05 DIAGNOSIS — J9601 Acute respiratory failure with hypoxia: Secondary | ICD-10-CM

## 2013-08-05 DIAGNOSIS — D689 Coagulation defect, unspecified: Secondary | ICD-10-CM

## 2013-08-05 DIAGNOSIS — F17200 Nicotine dependence, unspecified, uncomplicated: Secondary | ICD-10-CM | POA: Diagnosis present

## 2013-08-05 DIAGNOSIS — Z86718 Personal history of other venous thrombosis and embolism: Secondary | ICD-10-CM

## 2013-08-05 DIAGNOSIS — J189 Pneumonia, unspecified organism: Principal | ICD-10-CM

## 2013-08-05 DIAGNOSIS — J432 Centrilobular emphysema: Secondary | ICD-10-CM

## 2013-08-05 DIAGNOSIS — H9190 Unspecified hearing loss, unspecified ear: Secondary | ICD-10-CM | POA: Diagnosis present

## 2013-08-05 DIAGNOSIS — C7951 Secondary malignant neoplasm of bone: Secondary | ICD-10-CM | POA: Diagnosis present

## 2013-08-05 DIAGNOSIS — C61 Malignant neoplasm of prostate: Secondary | ICD-10-CM

## 2013-08-05 DIAGNOSIS — R791 Abnormal coagulation profile: Secondary | ICD-10-CM | POA: Diagnosis present

## 2013-08-05 DIAGNOSIS — J438 Other emphysema: Secondary | ICD-10-CM

## 2013-08-05 DIAGNOSIS — M129 Arthropathy, unspecified: Secondary | ICD-10-CM | POA: Diagnosis present

## 2013-08-05 DIAGNOSIS — D72829 Elevated white blood cell count, unspecified: Secondary | ICD-10-CM

## 2013-08-05 DIAGNOSIS — H3530 Unspecified macular degeneration: Secondary | ICD-10-CM | POA: Diagnosis present

## 2013-08-05 DIAGNOSIS — Z8546 Personal history of malignant neoplasm of prostate: Secondary | ICD-10-CM

## 2013-08-05 DIAGNOSIS — J449 Chronic obstructive pulmonary disease, unspecified: Secondary | ICD-10-CM

## 2013-08-05 DIAGNOSIS — C787 Secondary malignant neoplasm of liver and intrahepatic bile duct: Secondary | ICD-10-CM | POA: Diagnosis present

## 2013-08-05 DIAGNOSIS — Z923 Personal history of irradiation: Secondary | ICD-10-CM

## 2013-08-05 DIAGNOSIS — J9600 Acute respiratory failure, unspecified whether with hypoxia or hypercapnia: Secondary | ICD-10-CM | POA: Diagnosis present

## 2013-08-05 DIAGNOSIS — C7952 Secondary malignant neoplasm of bone marrow: Secondary | ICD-10-CM

## 2013-08-05 DIAGNOSIS — I2699 Other pulmonary embolism without acute cor pulmonale: Secondary | ICD-10-CM

## 2013-08-05 HISTORY — DX: Other pulmonary embolism without acute cor pulmonale: I26.99

## 2013-08-05 LAB — COMPREHENSIVE METABOLIC PANEL
ALBUMIN: 3 g/dL — AB (ref 3.5–5.2)
ALT: 43 U/L (ref 0–53)
AST: 51 U/L — ABNORMAL HIGH (ref 0–37)
Alkaline Phosphatase: 277 U/L — ABNORMAL HIGH (ref 39–117)
BUN: 13 mg/dL (ref 6–23)
CALCIUM: 9 mg/dL (ref 8.4–10.5)
CO2: 27 mEq/L (ref 19–32)
CREATININE: 1.32 mg/dL (ref 0.50–1.35)
Chloride: 100 mEq/L (ref 96–112)
GFR calc non Af Amer: 53 mL/min — ABNORMAL LOW (ref >90)
GFR, EST AFRICAN AMERICAN: 62 mL/min — AB (ref >90)
GLUCOSE: 118 mg/dL — AB (ref 70–99)
Potassium: 5 mEq/L (ref 3.7–5.3)
Sodium: 139 mEq/L (ref 137–147)
TOTAL PROTEIN: 7 g/dL (ref 6.0–8.3)
Total Bilirubin: 1.2 mg/dL (ref 0.3–1.2)

## 2013-08-05 LAB — CBC WITH DIFFERENTIAL/PLATELET
BASOS PCT: 0 % (ref 0–1)
Basophils Absolute: 0.1 10*3/uL (ref 0.0–0.1)
EOS ABS: 0.4 10*3/uL (ref 0.0–0.7)
EOS PCT: 3 % (ref 0–5)
HCT: 47.4 % (ref 39.0–52.0)
HEMOGLOBIN: 15.9 g/dL (ref 13.0–17.0)
LYMPHS ABS: 0.6 10*3/uL — AB (ref 0.7–4.0)
Lymphocytes Relative: 4 % — ABNORMAL LOW (ref 12–46)
MCH: 30.3 pg (ref 26.0–34.0)
MCHC: 33.5 g/dL (ref 30.0–36.0)
MCV: 90.3 fL (ref 78.0–100.0)
MONOS PCT: 8 % (ref 3–12)
Monocytes Absolute: 1.2 10*3/uL — ABNORMAL HIGH (ref 0.1–1.0)
Neutro Abs: 12.9 10*3/uL — ABNORMAL HIGH (ref 1.7–7.7)
Neutrophils Relative %: 85 % — ABNORMAL HIGH (ref 43–77)
Platelets: 151 10*3/uL (ref 150–400)
RBC: 5.25 MIL/uL (ref 4.22–5.81)
RDW: 15.3 % (ref 11.5–15.5)
WBC: 15.1 10*3/uL — ABNORMAL HIGH (ref 4.0–10.5)

## 2013-08-05 LAB — TYPE AND SCREEN
ABO/RH(D): O POS
ANTIBODY SCREEN: NEGATIVE

## 2013-08-05 LAB — I-STAT CG4 LACTIC ACID, ED: Lactic Acid, Venous: 1.17 mmol/L (ref 0.5–2.2)

## 2013-08-05 LAB — PROTIME-INR
INR: 2.08 — ABNORMAL HIGH (ref 0.00–1.49)
Prothrombin Time: 23.4 seconds — ABNORMAL HIGH (ref 11.6–15.2)

## 2013-08-05 LAB — TROPONIN I

## 2013-08-05 LAB — BLOOD GAS, ARTERIAL
Acid-Base Excess: 0.5 mmol/L (ref 0.0–2.0)
BICARBONATE: 23.9 meq/L (ref 20.0–24.0)
Drawn by: 382351
FIO2: 0.4 %
O2 SAT: 95.3 %
PATIENT TEMPERATURE: 37
TCO2: 20.1 mmol/L (ref 0–100)
pCO2 arterial: 33.6 mmHg — ABNORMAL LOW (ref 35.0–45.0)
pH, Arterial: 7.466 — ABNORMAL HIGH (ref 7.350–7.450)
pO2, Arterial: 76 mmHg — ABNORMAL LOW (ref 80.0–100.0)

## 2013-08-05 LAB — PRO B NATRIURETIC PEPTIDE: PRO B NATRI PEPTIDE: 204.3 pg/mL — AB (ref 0–125)

## 2013-08-05 LAB — APTT: APTT: 36 s (ref 24–37)

## 2013-08-05 MED ORDER — ACETAMINOPHEN 325 MG PO TABS
650.0000 mg | ORAL_TABLET | Freq: Four times a day (QID) | ORAL | Status: DC | PRN
  Filled 2013-08-05: qty 2

## 2013-08-05 MED ORDER — CEFEPIME HCL 1 G IJ SOLR
1.0000 g | Freq: Three times a day (TID) | INTRAMUSCULAR | Status: DC
  Administered 2013-08-06 – 2013-08-09 (×11): 1 g via INTRAVENOUS
  Filled 2013-08-05 (×14): qty 1

## 2013-08-05 MED ORDER — IPRATROPIUM-ALBUTEROL 0.5-2.5 (3) MG/3ML IN SOLN
3.0000 mL | RESPIRATORY_TRACT | Status: DC
  Administered 2013-08-06 (×4): 3 mL via RESPIRATORY_TRACT
  Filled 2013-08-05 (×4): qty 3

## 2013-08-05 MED ORDER — ONDANSETRON HCL 4 MG PO TABS: 4.0000 mg | ORAL_TABLET | Freq: Four times a day (QID) | ORAL | Status: DC | PRN

## 2013-08-05 MED ORDER — ONDANSETRON HCL 4 MG/2ML IJ SOLN: 4.0000 mg | Freq: Four times a day (QID) | INTRAMUSCULAR | Status: DC | PRN

## 2013-08-05 MED ORDER — HYDROMORPHONE HCL PF 1 MG/ML IJ SOLN: 0.5000 mg | INTRAMUSCULAR | Status: DC | PRN

## 2013-08-05 MED ORDER — VANCOMYCIN HCL IN DEXTROSE 1-5 GM/200ML-% IV SOLN
1000.0000 mg | Freq: Once | INTRAVENOUS | Status: AC
  Administered 2013-08-05: 1000 mg via INTRAVENOUS
  Filled 2013-08-05: qty 200

## 2013-08-05 MED ORDER — IPRATROPIUM-ALBUTEROL 0.5-2.5 (3) MG/3ML IN SOLN
3.0000 mL | Freq: Once | RESPIRATORY_TRACT | Status: AC
  Administered 2013-08-05: 3 mL via RESPIRATORY_TRACT
  Filled 2013-08-05: qty 3

## 2013-08-05 MED ORDER — VANCOMYCIN HCL IN DEXTROSE 750-5 MG/150ML-% IV SOLN
750.0000 mg | Freq: Two times a day (BID) | INTRAVENOUS | Status: DC
  Administered 2013-08-06 – 2013-08-09 (×7): 750 mg via INTRAVENOUS
  Filled 2013-08-05 (×10): qty 150

## 2013-08-05 MED ORDER — ACETAMINOPHEN 650 MG RE SUPP: 650.0000 mg | Freq: Four times a day (QID) | RECTAL | Status: DC | PRN

## 2013-08-05 MED ORDER — ALBUTEROL (5 MG/ML) CONTINUOUS INHALATION SOLN
15.0000 mg/h | INHALATION_SOLUTION | Freq: Once | RESPIRATORY_TRACT | Status: AC
  Administered 2013-08-05: 15 mg/h via RESPIRATORY_TRACT
  Filled 2013-08-05 (×2): qty 20

## 2013-08-05 MED ORDER — DEXTROSE 5 % IV SOLN
2.0000 g | Freq: Once | INTRAVENOUS | Status: AC
  Administered 2013-08-05: 2 g via INTRAVENOUS
  Filled 2013-08-05: qty 2

## 2013-08-05 MED ORDER — ALUM & MAG HYDROXIDE-SIMETH 200-200-20 MG/5ML PO SUSP: 30.0000 mL | Freq: Four times a day (QID) | ORAL | Status: DC | PRN

## 2013-08-05 MED ORDER — RIVAROXABAN 20 MG PO TABS
20.0000 mg | ORAL_TABLET | Freq: Every day | ORAL | Status: DC
  Administered 2013-08-06 – 2013-08-08 (×3): 20 mg via ORAL
  Filled 2013-08-05 (×3): qty 1

## 2013-08-05 MED ORDER — OXYCODONE HCL 5 MG PO TABS: 5.0000 mg | ORAL_TABLET | ORAL | Status: DC | PRN

## 2013-08-05 MED ORDER — SODIUM CHLORIDE 0.9 % IV SOLN
INTRAVENOUS | Status: DC
  Administered 2013-08-06 (×2): via INTRAVENOUS

## 2013-08-05 MED ORDER — METHYLPREDNISOLONE SODIUM SUCC 125 MG IJ SOLR
125.0000 mg | Freq: Once | INTRAMUSCULAR | Status: AC
  Administered 2013-08-05: 125 mg via INTRAVENOUS
  Filled 2013-08-05: qty 2

## 2013-08-05 MED ORDER — IOHEXOL 350 MG/ML SOLN
100.0000 mL | Freq: Once | INTRAVENOUS | Status: AC | PRN
  Administered 2013-08-05: 100 mL via INTRAVENOUS

## 2013-08-05 MED ORDER — SODIUM CHLORIDE 0.9 % IJ SOLN
3.0000 mL | Freq: Two times a day (BID) | INTRAMUSCULAR | Status: DC
  Administered 2013-08-07 – 2013-08-09 (×4): 3 mL via INTRAVENOUS

## 2013-08-05 NOTE — ED Notes (Addendum)
Patient complaining of shortness of breath and generalized weakness. States recently admitted to [facility] for pneumonia and pulmonary embolus.  Patient also reports has been running fever for a couple days, fever of 102 at home today. Patient also reports decreased appetite and poor PO intake.

## 2013-08-05 NOTE — ED Notes (Signed)
Patient received 5 mg of albuterol neb by EMS PTA.

## 2013-08-05 NOTE — Telephone Encounter (Signed)
Spoke with pt in ref to lab and cxr result per TP. Pt then stated that he is very fatigued, loss of appetite, SOB and has been vomiting with chills. Vomited at 2:00 p.m. Advised pt that he needed to go ED due to symptoms. Pt reported that he would go to [facility]. Pt scheduled with MR 08/24/13 and will keep this appointment, pt will call with update once he finds out information at the ED. Nothing further needed at this time

## 2013-08-05 NOTE — Progress Notes (Signed)
ANTIBIOTIC CONSULT NOTE - INITIAL  Pharmacy Consult for Vancomycin & Cefepime Indication: pneumonia  No Known Allergies  Patient Measurements: Height: 6\' 1"  (185.4 cm) Weight: 200 lb (90.719 kg) IBW/kg (Calculated) : 79.9  Vital Signs: Temp: 99.9 F (37.7 C) (06/24 1826) Temp src: Oral (06/24 1826) BP: 108/76 mmHg (06/24 1826) Pulse Rate: 109 (06/24 1826) Intake/Output from previous day:   Intake/Output from this shift:    Labs:  Recent Labs  08/05/13 1856  WBC 15.1*  HGB 15.9  PLT 151  CREATININE 1.32   Estimated Creatinine Clearance: 59.7 ml/min (by C-G formula based on Cr of 1.32). No results found for this basename: VANCOTROUGH, Corlis Leak, VANCORANDOM, [place], GENTPEAK, GENTRANDOM, TOBRATROUGH, TOBRAPEAK, TOBRARND, AMIKACINPEAK, AMIKACINTROU, AMIKACIN,  in the last 72 hours   Microbiology: Recent Results (from the past 720 hour(s))  CULTURE, BLOOD (ROUTINE X 2)     Status: None   Collection Time    07/10/13 11:10 AM      Result Value Ref Range Status   Specimen Description BLOOD LEFT ARM   Final   Special Requests BOTTLES DRAWN AEROBIC ONLY [place]   Final   Culture NO GROWTH 5 DAYS   Final   Report Status 07/15/2013 FINAL   Final  CULTURE, BLOOD (ROUTINE X 2)     Status: None   Collection Time    07/10/13 11:15 AM      Result Value Ref Range Status   Specimen Description BLOOD LEFT HAND   Final   Special Requests BOTTLES DRAWN AEROBIC ONLY [place]   Final   Culture NO GROWTH 5 DAYS   Final   Report Status 07/15/2013 FINAL   Final  MRSA PCR SCREENING     Status: Abnormal   Collection Time    07/10/13  6:26 PM      Result Value Ref Range Status   MRSA by PCR POSITIVE (*) NEGATIVE Final   Comment:            The GeneXpert MRSA Assay (FDA     approved for NASAL specimens     only), is one component of a     comprehensive MRSA colonization     surveillance program. It is not     intended to diagnose MRSA     infection nor to guide or     monitor  treatment for     MRSA infections.     RESULT CALLED TO, READ BACK BY AND VERIFIED WITH:     M.HOPPER ,RN AT 2230 BY L.PITT 07/10/13  CULTURE, EXPECTORATED SPUTUM-ASSESSMENT     Status: None   Collection Time    07/11/13 12:16 AM      Result Value Ref Range Status   Specimen Description SPUTUM   Final   Special Requests NONE   Final   Sputum evaluation     Final   Value: THIS SPECIMEN IS ACCEPTABLE. RESPIRATORY CULTURE REPORT TO FOLLOW.   Report Status 07/11/2013 FINAL   Final  CULTURE, RESPIRATORY (NON-EXPECTORATED)     Status: None   Collection Time    07/11/13 12:16 AM      Result Value Ref Range Status   Specimen Description SPUTUM   Final   Special Requests NONE   Final   Gram Stain     Final   Value: FEW WBC PRESENT, PREDOMINANTLY MONONUCLEAR     FEW SQUAMOUS EPITHELIAL CELLS PRESENT     NO ORGANISMS SEEN     Performed at [facility]  Culture     Final   Value: NORMAL OROPHARYNGEAL FLORA     Performed at [facility]   Report Status 07/13/2013 FINAL   Final    Medical History: Past Medical History  Diagnosis Date  . H/O renal calculi   . Urethral stricture   . Wears hearing aid     right ear  . Hearing impaired person, right   . Arthritis   . Prostate cancer 10/24/07    radiation therapy  . Macular degeneration   . Ulcerative colitis   . History of kidney stones   . Pulmonary embolus     Medications:  Scheduled:    Assessment: 68 yo M who presents with shortness of breath, weakness, and reported fever of 102 F at home.  He was hospitalized at [facility] for PE/DVT ~ 1 month ago (5/29>>6/1).  He has not been febrile since admission.  WBC is elevated.  CXR + PNA.  Renal function is at patient's baseline.   Vancomycin 6/24>> Cefepime 6/24>>  Goal of Therapy:  Vancomycin trough level 15-20 mcg/ml  Plan:  Vancomycin 1gm IV x1 then 750mg  IV q12h Check Vancomycin trough at steady state Cefepime 2gm IV x1 now then 1gm IV q8h Monitor renal  function and cx data   Biagio Borg 08/05/2013,7:47 PM

## 2013-08-05 NOTE — H&P (Signed)
[facility] History and Physical  ZI [place] HMC:[id number] DOB: 08-31-1945 DOA: 08/05/2013  Referring physician:  EDP PCP: [name], FNP  Specialists:   Chief Complaint:  SOB, Fever and Chills  HPI: Jonathan Goodwin is a 68 y.o. male with a history of COPD, Prostate Ca S/P Rad Rx and Recent hospitalization from  Due to CAP and Pulmonary Embolism ( now on Xarelto Rx) who presents to the ED with complaints of worsening SOB, productive Cough and Fevers and Chills for the past 2.5 weeks. He has had weakness and fatigue and loss of appetite and He had Nausea and vomiting x 1 today.   He was hypoxic in the ED and required 4 liters of [place] O2, a  Chest X-ray and CTA of the Chest were performed and revealed development of a multifocal pneumonia acutely.    Review of Systems:  Constitutional: No Weight Loss, No Weight Gain, Night Sweats, +Fevers, +Chills, +Fatigue, +Generalized Weakness HEENT: No Headaches, Difficulty Swallowing,Tooth/Dental Problems,Sore Throat,  No Sneezing, Rhinitis, Ear Ache, Nasal Congestion, or Post Nasal Drip,  Cardio-vascular:  No Chest pain, Orthopnea, PND, Edema in lower extremities, Anasarca, Dizziness, Palpitations  Resp: No Dyspnea, No DOE, +Productive Cough, No Hemoptysis, No Wheezing.    GI: No Heartburn, Indigestion, Abdominal Pain, +Nausea, +Vomiting, Diarrhea, Change in Bowel Habits,  +Loss of Appetite  GU: No Dysuria, Change in Color of Urine, No Urgency or Frequency.  No flank pain.  Musculoskeletal: No Joint Pain or Swelling.  No Decreased Range of Motion. No Back Pain.  Neurologic: No Syncope, No Seizures, Muscle Weakness, Paresthesia, Vision Disturbance or Loss, No Diplopia, No Vertigo, No Difficulty Walking,  Skin: No Rash or Lesions. Psych: No Change in Mood or Affect. No Depression or Anxiety. No Memory loss. No Confusion or Hallucinations   Past Medical History  Diagnosis Date  . H/O renal calculi   . Urethral stricture   . Wears hearing  aid     right ear  . Hearing impaired person, right   . Arthritis   . Prostate cancer 10/24/07    radiation therapy  . Macular degeneration   . Ulcerative colitis   . History of kidney stones   . Pulmonary embolus     Past Surgical History  Procedure Laterality Date  . Knee arthroscopy  1993    right  . Appendectomy  2010    [facility], Dr. Romona Curls  . Shoulder surgery      left, for chronic dislocation  . Cystoscopy  2010    [facility], Dr. Maryland Pink  . Urethrotomy  06/12/2011    Procedure: CYSTOSCOPY/URETHROTOMY;  Surgeon: Marissa Nestle, MD;  Location: [facility];  Service: Urology;  Laterality: N/A;  optical urethrotomy  . Cataract extraction w/phaco Left 01/15/2013    Procedure: CATARACT EXTRACTION PHACO AND INTRAOCULAR LENS PLACEMENT (IOC);  Surgeon: Tonny Branch, MD;  Location: [facility];  Service: Ophthalmology;  Laterality: Left;  CDE:22.42  . Cystoscopy with urethral dilatation N/A 06/30/2013    Procedure: CYSTOSCOPY WITH URETHRAL DILATATION;  Surgeon: Marissa Nestle, MD;  Location: [facility];  Service: Urology;  Laterality: N/A;  . Rectal exam under anesthesia N/A 06/30/2013    Procedure: RECTAL EXAM UNDER ANESTHESIA;  Surgeon: Marissa Nestle, MD;  Location: [facility];  Service: Urology;  Laterality: N/A;  . Urethrotomy N/A 06/30/2013    Procedure: CYSTOSCOPY/URETHROTOMY;  Surgeon: Marissa Nestle, MD;  Location: [facility];  Service: Urology;  Laterality: N/A;  . Cystoscopy 2015  Prior to Admission medications   Medication Sig Start Date End Date Taking? Authorizing Chasten Blaze  budesonide-formoterol (SYMBICORT) 160-4.5 MCG/ACT inhaler Inhale 2 puffs into the lungs 2 (two) times daily. 07/13/13  Yes Donita Brooks, NP  rivaroxaban (XARELTO) 20 MG TABS tablet Take 1 tablet (20 mg total) by mouth daily with supper. 08/02/13  Yes Donita Brooks, NP    No Known Allergies   Social History:  reports that he has been smoking Cigarettes.  He has a 58 pack-year smoking history. He has never used  smokeless tobacco. He reports that he drinks alcohol. He reports that he does not use illicit drugs.     Family History  Problem Relation Age of Onset  . Cancer Mother     colon  . Cancer Paternal Grandmother     colon       Physical Exam:  GEN:  Pleasant Elderly Well Developed 68 y.o. Caucasian male examined and in no acute distress; cooperative with exam Filed Vitals:   08/05/13 2030 08/05/13 2100 08/05/13 2130 08/05/13 2200  BP: 116/75 102/68  106/71  Pulse: 111 105 108 95  Temp:      TempSrc:      Resp: 28 23 25 24   Height:      Weight:      SpO2: 94% 95% 97% 96%   Blood pressure 106/71, pulse 95, temperature 99.9 F (37.7 C), temperature source Oral, resp. rate 24, height 6\' 1"  (1.854 m), weight 90.719 kg (200 lb), SpO2 96.00%. PSYCH: He is alert and oriented x4; does not appear anxious does not appear depressed; affect is normal HEENT: Normocephalic and Atraumatic, Mucous membranes pink; PERRLA; EOM intact; Fundi:  Benign;  No scleral icterus, Nares: Patent, Oropharynx: Clear, Fair Dentition, Neck:  FROM, no cervical lymphadenopathy nor thyromegaly or carotid bruit; no JVD; Breasts:: Not examined CHEST WALL: No tenderness CHEST: Unlabored Breathing with mildly Decreased Breath sounds, Occasional Rhonchi No Wheezes No rales.   HEART: Regular rate and rhythm; no murmurs rubs or gallops BACK: No kyphosis or scoliosis; no CVA tenderness ABDOMEN: Positive Bowel Sounds, soft non-tender; no masses, no organomegaly. Rectal Exam: Not done EXTREMITIES: No cyanosis, clubbing or edema; no ulcerations. Genitalia: not examined PULSES: 2+ and symmetric SKIN: Normal hydration no rash or ulceration CNS:  Alert and Oriented X 4,  No Focal Deficits, Generalized Weakness Vascular: pulses palpable throughout    Labs on Admission:  Basic Metabolic Panel:  Recent Labs Lab 08/05/13 1856  NA 139  K 5.0  CL 100  CO2 27  GLUCOSE 118*  BUN 13  CREATININE 1.32  CALCIUM 9.0    Liver Function Tests:  Recent Labs Lab 08/05/13 1856  AST 51*  ALT 43  ALKPHOS 277*  BILITOT 1.2  PROT 7.0  ALBUMIN 3.0*   No results found for this basename: LIPASE, AMYLASE,  in the last 168 hours No results found for this basename: AMMONIA,  in the last 168 hours CBC:  Recent Labs Lab 08/05/13 1856  WBC 15.1*  NEUTROABS 12.9*  HGB 15.9  HCT 47.4  MCV 90.3  PLT 151   Cardiac Enzymes:  Recent Labs Lab 08/05/13 1856  TROPONINI <0.30    BNP (last 3 results)  Recent Labs  07/10/13 1014 08/05/13 1856  PROBNP 1349.0* 204.3*   CBG: No results found for this basename: GLUCAP,  in the last 168 hours  Radiological Exams on Admission: Ct Angio Chest W/cm &/or Wo Cm  08/05/2013   CLINICAL DATA:  Shortness  of breath. History of kidney stents, previous pulmonary embolus, prostate cancer with radiation therapy.  EXAM: CT ANGIOGRAPHY CHEST WITH CONTRAST  TECHNIQUE: Multidetector CT imaging of the chest was performed using the standard protocol during bolus administration of intravenous contrast. Multiplanar CT image reconstructions and MIPs were obtained to evaluate the vascular anatomy.  CONTRAST:  169mL OMNIPAQUE IOHEXOL 350 MG/ML SOLN  COMPARISON:  07/10/2013  FINDINGS: Technically adequate study with moderately good opacification of the central and segmental pulmonary arteries. There is a small amount of residual eccentric thrombus demonstrated in the distal right main and proximal right lower lobe pulmonary arteries. This demonstrates improvement since prior study consistent with response to interval therapy. No new occlusive thrombus is demonstrated. Persistent increase in the right ventricular left ventricular ratio suggesting continued right heart strain.  Mild cardiac enlargement. Normal caliber thoracic aorta without dissection. Calcification of the aorta and Coronary arteries. Again demonstrated and similar to prior study, there is evidence of lymphadenopathy in the  mediastinum and bilateral hilar regions. Since the prior study, there is increasing consolidation and volume loss with air bronchograms in the right middle lung with increasing airspace disease throughout the right lower lung. Small right pleural effusion is developing since the prior study. Atelectasis or infiltration also present in the left lung base. Prominent centrilobular emphysematous changes are demonstrated in the upper lungs bilaterally. No pneumothorax.  Visualized portions of the upper abdominal organs demonstrate multiple hypo enhancing nodules throughout the liver consistent with metastasis. Spleen size is normal. Left adrenal gland nodule measuring 21 mm diameter. Bilateral renal cysts. Abdominal findings are unchanged since prior study. Mild degenerative changes in the thoracic spine. Diffuse tip bone demineralization with small heterogeneous areas of sclerosis throughout. This is likely representing bony metastasis.  Review of the MIP images confirms the above findings.  IMPRESSION: 1. Overall decreasing clot burden in the pulmonary arteries since previous study with residual nonocclusive thrombus demonstrated on the right. Persistent changes suggesting right heart strain. 2. Increasing consolidation and airspace disease in the right lung could represent progressing pneumonia, nonspecific consolidated process, or possibly obstruction due to mass or extrinsic compression. 3. Persistent changes, likely metastatic, involving mediastinal and hilar lymph nodes, multiple liver masses, left adrenal gland nodule, and heterogeneous bone sclerosis.   Electronically Signed   By: Lucienne Capers M.D.   On: 08/05/2013 21:17   Dg Chest Port 1 View  08/05/2013   CLINICAL DATA:  Shortness of breath. Hypoxia. History of prostate cancer. Recent diagnosis of extensive bilateral pulmonary embolus on 07/10/2013  EXAM: PORTABLE CHEST - 1 VIEW  COMPARISON:  Multiple exams, including 07/24/2013  FINDINGS: Continued  airspace opacity in the right middle lobe. Significant increase in airspace opacity in the right lower lobe now obscuring the right hemidiaphragm.  Low lung volumes are present, causing crowding of the pulmonary vasculature. Left hilar fullness compatible with known hilar adenopathy. Interstitial accentuation and parts of the right lung specially the periphery of the mid lung.  IMPRESSION: 1. Worsened airspace opacity in the right lower lobe, potentially from pulmonary hemorrhage or pneumonia. Continued right middle lobe airspace opacity. 2. Hilar fullness on the left compatible with hilar adenopathy. 3. Low lung volumes.   Electronically Signed   By: Sherryl Barters M.D.   On: 08/05/2013 19:20       Assessment/Plan:   68 y.o. male with  Principal Problem:   HCAP (healthcare-associated pneumonia) Active Problems:   Pulmonary embolism   Hypoxemia   COPD (chronic obstructive pulmonary disease)-PFT pending  Leukocytosis   Coagulopathy     1.   HCAP-   IV Vancomycin and Cefepime, DuoNebs, and O2 Rx, and IVFs.    2.   Pulmonary Embolism -  Diagnosed 05/29 hospitalization, Continue on Xarelto Rx.    3.   Hypoxemia-  Due to #1 and #2, and underlying COPD,  O2 and Nebs and Abx for #1.    4.   COPD-  DUO Nebs, and needs to quit smoking.    5.   Leukocytosis-  Due to #1.  Monitor trend.    6.   Coagulopathy-  Due to Xarelto Rx.       Code Status:   FULL CODE Family Communication:    Wife at Bedside Disposition Plan:       Inpatient  Time spent:  [place] [facility] Pager [phone]  If 7PM-7AM, please contact night-coverage [email or web address] Password [facility] 08/05/2013, 10:32 PM

## 2013-08-05 NOTE — ED Notes (Signed)
O2 decreased to 2L [place], pt. Sat 96%

## 2013-08-05 NOTE — ED Provider Notes (Signed)
This chart was scribed for [place], DO, by Neta Ehlers, ED Scribe. This patient was seen in room [facility]14/[facility]14.   TIME SEEN: 6:43 PM  CHIEF COMPLAINT: Shortness of Breath    HPI:  Jonathan Goodwin is a 68 y.o. male with a h/o recent community acquired pneumonia and pulmonary embolus on Xarelto who presents to the Emergency Department complaining of persistent shortness of breath. The SOB has been associated with fatigue, a decreased appetite, persistent thirst, a nonproductive cough, and a fever. The pt reports a new symptom of emesis today. His maximum temperature at home was 102 F, and he has a temperature of 99.9 F in the ED.  He does not wear oxygen at home. He denies current chest pain, though he reports post-tusive chest pain last week which has since resolved. He was recently treated at [facility] for pneumonia  with IV antibiotics and prescribed oral antibiotics. Jonathan Goodwin is a current smoker.  His PCP is Burman Freestone, NP, at [facility]   ROS: See HPI Constitutional: positive fever, positive fatigue, decreased appetite  Eyes: no drainage  ENT: no runny nose   Cardiovascular:  no chest pain  Resp: positive SOB, positive cough   GI: positive vomiting GU: no dysuria Integumentary: no rash  Allergy: no hives  Musculoskeletal: no leg swelling  Neurological: no slurred speech ROS otherwise negative  PAST MEDICAL HISTORY/PAST SURGICAL HISTORY:  Past Medical History  Diagnosis Date  . H/O renal calculi   . Urethral stricture   . Wears hearing aid     right ear  . Hearing impaired person, right   . Arthritis   . Prostate cancer 10/24/07    radiation therapy  . Macular degeneration   . Ulcerative colitis   . History of kidney stones   . Pulmonary embolus     MEDICATIONS:  Prior to Admission medications   Medication Sig Start Date End Date Taking? Authorizing Provider  amoxicillin-clavulanate (AUGMENTIN) 875-125 MG per tablet Take 1 tablet by mouth 2  (two) times daily.    Historical Provider, MD  budesonide-formoterol (SYMBICORT) 160-4.5 MCG/ACT inhaler Inhale 2 puffs into the lungs 2 (two) times daily. 07/13/13   Donita Brooks, NP  Rivaroxaban (XARELTO) 15 MG TABS tablet Take 15 mg by mouth every morning. 07/13/13 08/02/13  Donita Brooks, NP  rivaroxaban (XARELTO) 20 MG TABS tablet Take 1 tablet (20 mg total) by mouth daily with supper. 08/02/13   Donita Brooks, NP    ALLERGIES:  No Known Allergies  SOCIAL HISTORY:  History  Substance Use Topics  . Smoking status: Current Every Day Smoker -- 1.00 packs/day for 58 years    Types: Cigarettes  . Smokeless tobacco: Never Used     Comment: trying to quit.  . Alcohol Use: Yes     Comment: occasional    FAMILY HISTORY: Family History  Problem Relation Age of Onset  . Cancer Mother     colon  . Cancer Paternal Grandmother     colon    EXAM: Triage Vitals: BP 108/76  Pulse 109  Temp(Src) 99.9 F (37.7 C) (Oral)  Resp 27  Ht 6\' 1"  (1.854 m)  Wt 200 lb (90.719 kg)  BMI 26.39 kg/m2  SpO2 93%  CONSTITUTIONAL: Alert and oriented and responds appropriately to questions. Chronically ill-appearing  HEAD: Normocephalic EYES: Conjunctivae clear, PERRL ENT: normal nose; no rhinorrhea; moist mucous membranes; pharynx without lesions noted NECK: Supple, no meningismus, no LAD  CARD:  Tachycardic, regular rythym; S1 and S2 appreciated; no murmurs, no clicks, no rubs, no gallops RESP: Tachypnic, decreased breath sounds at bases; no wheezes, no rhonchi, no rales,  ABD/GI: Normal bowel sounds; non-distended; soft, non-tender, no rebound, no guarding BACK:  The back appears normal and is non-tender to palpation, there is no CVA tenderness EXT: Normal ROM in all joints; non-tender to palpation; no edema; normal capillary refill; no cyanosis    SKIN: Normal color for age and race; warm NEURO: Moves all extremities equally PSYCH: The patient's mood and manner are appropriate. Grooming and  personal hygiene are appropriate.  MEDICAL DECISION MAKING: Patient here in moderate moderate respiratory distress. Differential diagnosis includes COPD exacerbation, healthcare associated pneumonia, PE, CHF exacerbation. We'll obtain cardiac labs, chest x-ray. We'll give Solu-Medrol continuous breathing treatment. Also obtain ABG. Patient will need admission. He has a new oxygen requirement.  ED PROGRESS: Patient has a new leukocytosis of 15.1 with left shift. His chest x-ray shows a worsening airspace opacity in the right lower lobe that may be due to pneumonia versus pulmonary hemorrhage. Given he is on anticoagulation, will obtain a CT to further evaluate. His hemoglobin is 15.9. Will give broad-spectrum antibiotics to cover for HCAP.  8:01 PM  Pt's tachypnea is improving with breathing treatments. Discussed with patient that he will need a CT scan. Discussed that he will need admission and he agrees with this plan.  10:03 PM  Pt's breath sounds are clearing up with continuous treatment. We are weaning his oxygen he is curled on 4 L nasal cannula satting 97%.  CT scan shows that his clot burden has decreased but there is increasing consolidation and airspace disease in the right lung could represent progressing pneumonia versus mass. No sign of active hemorrhage. Discussed with Dr. Arnoldo Morale for admission to inpatient, telemetry.    CRITICAL CARE Performed by: Nyra Jabs   Total critical care time: 45 minutes  Critical care time was exclusive of separately billable procedures and treating other patients.  Critical care was necessary to treat or prevent imminent or life-threatening deterioration.  Critical care was time spent personally by me on the following activities: development of treatment plan with patient and/or surrogate as well as nursing, discussions with consultants, evaluation of patient's response to treatment, examination of patient, obtaining history from patient or  surrogate, ordering and performing treatments and interventions, ordering and review of laboratory studies, ordering and review of radiographic studies, pulse oximetry and re-evaluation of patient's condition.     EKG Interpretation  Date/Time:  Wednesday August 05 2013 18:27:23 EDT Ventricular Rate:  115 PR Interval:  165 QRS Duration: 83 QT Interval:  319 QTC Calculation: 441 R Axis:   -94 Text Interpretation:  Sinus tachycardia Ventricular premature complex Probable left atrial enlargement LAD No significant change since last tracing Confirmed by Aanchal Cope,  DO, Sakiyah Shur [phone]) on 08/05/2013 6:37:47 PM           [place], DO 08/05/13 2204

## 2013-08-06 DIAGNOSIS — J9601 Acute respiratory failure with hypoxia: Secondary | ICD-10-CM

## 2013-08-06 DIAGNOSIS — J9600 Acute respiratory failure, unspecified whether with hypoxia or hypercapnia: Secondary | ICD-10-CM

## 2013-08-06 LAB — CBC
HEMATOCRIT: 44.6 % (ref 39.0–52.0)
Hemoglobin: 14.9 g/dL (ref 13.0–17.0)
MCH: 30.2 pg (ref 26.0–34.0)
MCHC: 33.4 g/dL (ref 30.0–36.0)
MCV: 90.5 fL (ref 78.0–100.0)
Platelets: 138 10*3/uL — ABNORMAL LOW (ref 150–400)
RBC: 4.93 MIL/uL (ref 4.22–5.81)
RDW: 15.4 % (ref 11.5–15.5)
WBC: 12.6 10*3/uL — AB (ref 4.0–10.5)

## 2013-08-06 LAB — BASIC METABOLIC PANEL
BUN: 18 mg/dL (ref 6–23)
CO2: 24 meq/L (ref 19–32)
Calcium: 8.7 mg/dL (ref 8.4–10.5)
Chloride: 100 mEq/L (ref 96–112)
Creatinine, Ser: 1.2 mg/dL (ref 0.50–1.35)
GFR calc Af Amer: 69 mL/min — ABNORMAL LOW (ref >90)
GFR calc non Af Amer: 60 mL/min — ABNORMAL LOW (ref >90)
Glucose, Bld: 179 mg/dL — ABNORMAL HIGH (ref 70–99)
POTASSIUM: 4.6 meq/L (ref 3.7–5.3)
SODIUM: 140 meq/L (ref 137–147)

## 2013-08-06 LAB — MRSA PCR SCREENING: MRSA by PCR: NEGATIVE

## 2013-08-06 MED ORDER — DEXTROSE 5 % IV SOLN
INTRAVENOUS | Status: AC
  Filled 2013-08-06: qty 1

## 2013-08-06 MED ORDER — BIOTENE DRY MOUTH MT LIQD
15.0000 mL | Freq: Two times a day (BID) | OROMUCOSAL | Status: DC
  Administered 2013-08-06 – 2013-08-09 (×4): 15 mL via OROMUCOSAL

## 2013-08-06 MED ORDER — IPRATROPIUM-ALBUTEROL 0.5-2.5 (3) MG/3ML IN SOLN
3.0000 mL | Freq: Three times a day (TID) | RESPIRATORY_TRACT | Status: DC
  Administered 2013-08-06 – 2013-08-09 (×8): 3 mL via RESPIRATORY_TRACT
  Filled 2013-08-06 (×8): qty 3

## 2013-08-06 MED ORDER — ALBUTEROL SULFATE (2.5 MG/3ML) 0.083% IN NEBU: 2.5000 mg | INHALATION_SOLUTION | RESPIRATORY_TRACT | Status: DC | PRN

## 2013-08-06 NOTE — Care Management Utilization Note (Signed)
UR completed 

## 2013-08-06 NOTE — Care Management Note (Signed)
    Page 1 of 1   08/07/2013     2:53:51 PM CARE MANAGEMENT NOTE 08/07/2013  Patient:  Jonathan Goodwin, Jonathan Goodwin   Account Number:  [id number]  Date Initiated:  08/06/2013  Documentation initiated by:  Claretha Cooper  Subjective/Objective Assessment:   Pt admitted form home with his wife. No O2 at home, No HH or DME     Action/Plan:   Anticipated DC Date:  08/08/2013   Anticipated DC Plan:  [place]  CM consult      Choice offered to / List presented to:             Status of service:  Completed, signed off Medicare Important Message given?  YES (If response is "NO", the following Medicare IM given date fields will be blank) Date Medicare IM given:  08/07/2013 Date Additional Medicare IM given:    Discharge Disposition:    Per UR Regulation:    If discussed at Long Length of Stay Meetings, dates discussed:    Comments:  08/06/13 Claretha Cooper RN CM

## 2013-08-06 NOTE — Progress Notes (Signed)
PROGRESS NOTE  Jonathan Goodwin WUJ:[id number] DOB: Nov 14, 1945 DOA: 08/05/2013 PCP: [name], FNP Pulmonologist: Dr. Chase Caller  Summary: 68 year old man admitted 5/29-6/1 at [facility] for submassive pulmonary embolism with right ventricular strain, left lower extremity DVT, possible healthcare associated pneumonia. He was treated empirically for pneumonia, placed initially on heparin and then discharged on Xarelto, with plans to go to 20 mg daily on 6/21.  Assessment/Plan: 1. Acute hypoxic respiratory failure. Improving. Suspect multifactorial, predominantly suspected healthcare associated pneumonia complicated by recent pulmonary embolism as well as suspected COPD. 2. Bilateral healthcare associated pneumonia. Recurrent, treated approximately one month ago. Seems to be improved today with decreased leukocytosis, no fever, improved respiratory rate and hypoxia. By chart review initially diagnosed 5/20 and treated with clarithromycin but when failed to improve had CT scan 5/29 which demonstrated pulmonary embolism.  3. Submassive pulmonary embolism with right ventricular strain diagnosed 5/29, not a candidate for tPA per pulmonaolgy at that time. Continues on Xarelto. Repeat CT demonstrates decrease in clot burden. 4. Mediastinal adenopathy, multiple liver masses, left adrenal gland nodule and right lower lobe lung nodule with history of smoking. Outpatient workup planned. 5. Suspected COPD, has not yet had formal workup.  6. History of prostate cancer status post radiation 2009 7. Tobacco dependence   Overall seems to be improving today as outlined above. Plan to wean oxygen as tolerated, continue empiric antibiotics, bronchodilators as needed.  I will discuss CT findings with his pulmonologist Dr. Chase Caller for further recommendations.  Should followup with Dr. Chase Caller early July for PFTs.  Hopefully home in the next few days.  Code Status: full code DVT prophylaxis:  Xarelto Family Communication: none present Disposition Plan: home when improved  [name], MD  [facility]  Pager [phone] If 7PM-7AM, please contact night-coverage at [email or web address], password [facility] 08/06/2013, 7:36 AM  LOS: 1 day   Consultants:    Procedures:    Antibiotics:  Cefepime 6/25 >>   Vancomycin 6/2 >>   HPI/Subjective: Feels better today. Breathing better. Has more energy. Appetite has improved.  Objective: Filed Vitals:   08/06/13 0012 08/06/13 0440 08/06/13 0611 08/06/13 0704  BP:   110/75   Pulse:   83   Temp:   98.4 F (36.9 C)   TempSrc:   Oral   Resp:   20   Height:      Weight:      SpO2: 94% 94% 93% 91%    Intake/Output Summary (Last 24 hours) at 08/06/13 0736 Last data filed at 08/06/13 0644  Gross per 24 hour  Intake 466.25 ml  Output    400 ml  Net  66.25 ml     Filed Weights   08/05/13 1826  Weight: 90.719 kg (200 lb)    Exam:   afebrile, vital signs are stable. Oxygen saturation 91% on 2 L on. Normal respiratory rate.  Gen. Appears calm, comfortable. Nontoxic. Psych. Alert. Grossly normal mentation. Cardiovascular. Regular rate and rhythm. No murmur, rub or gallop. No lower extremity edema. Telemetry sinus rhythm. Respiratory. Clear to auscultation bilaterally with fair air movement. No rhonchi or rales. Normal respiratory effort.  Data Reviewed: Chemistry: Basic metabolic panel unremarkable. Heme: Leukocytosis has decreased, 12.6. ID: Blood cultures pending Other: ABG on admission unremarkable. Lactic acid was normal. Imaging: Decreased clot burden in chest CT from increasing consolidation and airspace disease right lung which could represent pneumonia or obstruction. Multiple liver masses, left adrenal gland nodule and heterogeneous bone sclerosis.   Scheduled Meds: . antiseptic oral  rinse  15 mL Mouth Rinse BID  . ceFEPime (MAXIPIME) IV  1 g Intravenous Q8H  . ipratropium-albuterol  3 mL Nebulization Q4H   . rivaroxaban  20 mg Oral Q supper  . sodium chloride  3 mL Intravenous Q12H  . vancomycin  750 mg Intravenous Q12H   Continuous Infusions: . sodium chloride 75 mL/hr at 08/06/13 0011    Principal Problem:   Acute respiratory failure with hypoxia Active Problems:   Pulmonary embolism   HCAP (healthcare-associated pneumonia)   COPD (chronic obstructive pulmonary disease)-PFT pending    Leukocytosis   Coagulopathy   Hypoxemia   Time spent 35 minutes greater than 50% in counseling and coordination of care

## 2013-08-07 ENCOUNTER — Inpatient Hospital Stay (HOSPITAL_COMMUNITY): Payer: Medicare Other

## 2013-08-07 LAB — BASIC METABOLIC PANEL
BUN: 25 mg/dL — AB (ref 6–23)
CALCIUM: 8.6 mg/dL (ref 8.4–10.5)
CO2: 25 mEq/L (ref 19–32)
Chloride: 107 mEq/L (ref 96–112)
Creatinine, Ser: 1.09 mg/dL (ref 0.50–1.35)
GFR, EST AFRICAN AMERICAN: 78 mL/min — AB (ref >90)
GFR, EST NON AFRICAN AMERICAN: 67 mL/min — AB (ref >90)
GLUCOSE: 96 mg/dL (ref 70–99)
POTASSIUM: 4.8 meq/L (ref 3.7–5.3)
SODIUM: 143 meq/L (ref 137–147)

## 2013-08-07 MED ORDER — IOHEXOL 300 MG/ML  SOLN
50.0000 mL | Freq: Once | INTRAMUSCULAR | Status: AC | PRN
  Administered 2013-08-07: 50 mL via ORAL

## 2013-08-07 MED ORDER — IOHEXOL 300 MG/ML  SOLN
100.0000 mL | Freq: Once | INTRAMUSCULAR | Status: AC | PRN
  Administered 2013-08-07: 100 mL via INTRAVENOUS

## 2013-08-07 NOTE — Progress Notes (Signed)
Patient MRSA swab was negative, discontinued orange contact.

## 2013-08-07 NOTE — Progress Notes (Signed)
PROGRESS NOTE  Jonathan Goodwin FUX:[id number] DOB: 1946/01/19 DOA: 08/05/2013 PCP: [name], FNP Pulmonologist: Dr. Chase Caller  Summary: 68 year old man admitted 5/29-6/1 at [facility] for submassive pulmonary embolism with right ventricular strain, left lower extremity DVT, possible healthcare associated pneumonia. He was treated empirically for pneumonia, placed initially on heparin and then discharged on Xarelto, with plans to go to 20 mg daily on 6/21.  Assessment/Plan: 1. Acute hypoxic respiratory failure. Stable. Suspect multifactorial, suspected healthcare associated pneumonia, right side, differential does include pulmonary infarct/necrosis. Other contributors recent PE and suspected COPD. 2. Healthcare associated pneumonia. Recurrent, treated approximately one month ago. Seems to be improving. 3. Submassive pulmonary embolism with right ventricular strain diagnosed 5/29, not a candidate for tPA per pulmonaolgy at that time. Continues on Xarelto. Repeat CT demonstrates decrease in clot burden. 4. Mediastinal adenopathy, multiple liver masses, left adrenal gland nodule and right lower lobe lung nodule with history of smoking.  5. Suspected COPD, has not yet had formal workup.  6. History of prostate cancer status post radiation 2009 7. Tobacco dependence   Wean oxygen. Consider transition to oral antibiotics next one to 2 days.  Discussed with his pulmonologist by telephone. The right sided lung changes are felt to be possibly related to Evolution of pulmonary embolism or infarct/necrosis. Pneumonia is is possible but not strongly favored. Obstructive process is not favored at this point. Bronchoscopy not indicated at this point. Recommends CT of the abdomen and pelvis to further evaluate liver lesions, evaluate for malignancy.  Should followup with Dr. Chase Caller early July for PFTs.  Discussed with wife at bedside.  Hopefully home in the next few days.  Code Status: full  code DVT prophylaxis: Xarelto Family Communication:  Disposition Plan: home when improved  [name], MD  [facility]  Pager [phone] If 7PM-7AM, please contact night-coverage at [email or web address], password [facility] 08/07/2013, 4:47 PM  LOS: 2 days   Consultants:    Procedures:    Antibiotics:  Cefepime 6/25 >>   Vancomycin 6/2 >>   HPI/Subjective: Feels about the same today. Breathing overall okay. Feels weak  Objective: Filed Vitals:   08/07/13 0745 08/07/13 0930 08/07/13 1435 08/07/13 1516  BP:  123/70 134/79   Pulse:  83 97   Temp:  97.8 F (36.6 C) 98.8 F (37.1 C)   TempSrc:  Oral Oral   Resp:  20 20   Height:      Weight:      SpO2: 91% 94% 100% 94%    Intake/Output Summary (Last 24 hours) at 08/07/13 1647 Last data filed at 08/07/13 1616  Gross per 24 hour  Intake 2618.75 ml  Output   4275 ml  Net -1656.25 ml     Filed Weights   08/05/13 1826  Weight: 90.719 kg (200 lb)    Exam:  Afebrile, vital signs are stable. Stable hypoxia on 2 L. Gen. Appears calm, mildly uncomfortable. Nontoxic. Psych. Grossly normal mood and affect. Speech fluent and appropriate. Cardiovascular. Regular rate and rhythm. No murmur, rub or gallop. No lower extremity edema. Telemetry sinus rhythm. No arrhythmias. Respiratory. Fair air movement. No frank wheezes, rales or rhonchi. Mild increased respiratory effort. Able to speak in full sentences.  Data Reviewed: Chemistry: Basic metabolic panel unremarkable. ID: Blood cultures pending  Scheduled Meds: . antiseptic oral rinse  15 mL Mouth Rinse BID  . ceFEPime (MAXIPIME) IV  1 g Intravenous Q8H  . ipratropium-albuterol  3 mL Nebulization TID  . rivaroxaban  20 mg Oral Q  supper  . sodium chloride  3 mL Intravenous Q12H  . vancomycin  750 mg Intravenous Q12H   Continuous Infusions: . sodium chloride 75 mL/hr at 08/06/13 1728    Principal Problem:   Acute respiratory failure with hypoxia Active  Problems:   Pulmonary embolism   HCAP (healthcare-associated pneumonia)   COPD (chronic obstructive pulmonary disease)-PFT pending    Leukocytosis   Coagulopathy   Hypoxemia   Time spent 35 minutes greater than 50% in counseling and coordination of care

## 2013-08-08 DIAGNOSIS — J449 Chronic obstructive pulmonary disease, unspecified: Secondary | ICD-10-CM

## 2013-08-08 NOTE — Progress Notes (Signed)
PROGRESS NOTE  Jonathan Goodwin [id number]:[id number] DOB: [birth year] DOA: 08/05/2013 PCP: [name], FNP Pulmonologist: Dr. Chase Caller  Summary: 68 year old man admitted 5/29-6/1 at [facility] for submassive pulmonary embolism with right ventricular strain, left lower extremity DVT, possible healthcare associated pneumonia. He was treated empirically for pneumonia, placed initially on heparin and then discharged on Xarelto, with plans to go to 20 mg daily on 6/21.  Assessment/Plan: 1. Acute hypoxic respiratory failure. Resolving. Suspect multifactorial, suspected healthcare associated pneumonia, right side, differential does include pulmonary infarct/necrosis. Other contributors recent PE and suspected COPD. 2. Healthcare associated pneumonia. Recurrent, treated approximately one month ago. Improving with treatment. 3. Submassive pulmonary embolism with right ventricular strain diagnosed 5/29, not a candidate for tPA per pulmonaolgy at that time. Continue Xarelto. Repeat CT demonstrates decreased clot burden. 4. Mediastinal adenopathy. Hepatic, nodal and osseous metastatic disease seen on CT the abdomen pelvis. Etiology unclear. Results discussed with patient and wife. 5. Suspected COPD, has not yet had formal workup.  6. History of prostate cancer status post radiation 2009 7. Tobacco dependence. Recommend cessation.   Overall improving. Hypoxia resolving. Likely change to oral antibiotics 6/28.  Outpatient followup with [place] will be arranged for further evaluation and recommendations. Check PSA. Likely outpatient liver biopsy.  Should followup with Dr. Chase Caller early July for PFTs.  Discussed with wife at bedside again 6/27  Likely home one to 2 days  Code Status: full code DVT prophylaxis: Xarelto Family Communication:  Disposition Plan: home when improved  [name], MD  [facility]  Pager [phone] If 7PM-7AM, please contact night-coverage at  [email or web address], password [facility] 08/08/2013, 1:44 PM  LOS: 3 days   Consultants:    Procedures:    Antibiotics:  Cefepime 6/25 >>   Vancomycin 6/2 >>   HPI/Subjective: Feeling somewhat better. Still getting short of breath but overall doing better.  Objective: Filed Vitals:   08/08/13 0612 08/08/13 0707 08/08/13 1017 08/08/13 1315  BP: 111/74  126/84   Pulse: 80  61   Temp: 98.4 F (36.9 C)  98.3 F (36.8 C)   TempSrc: Oral  Oral   Resp: 20  20   Height:      Weight:      SpO2: 97% 97% 95% 94%    Intake/Output Summary (Last 24 hours) at 08/08/13 1344 Last data filed at 08/08/13 1240  Gross per 24 hour  Intake   2232 ml  Output   3300 ml  Net  -1068 ml     Filed Weights   08/05/13 1826  Weight: 90.719 kg (200 lb)    Exam:  Afebrile, vital signs are stable. Hypoxia appears to result. Gen. Appears calm, comfortable. Psych. Speech fluent and clear. Grossly normal mentation. Cardiovascular. Regular rate and rhythm. No murmur, rub or gallop.  Respiratory. Clear to auscultation bilaterally. No wheezes, rales or rhonchi. Normal respiratory effort. Able to speak in full senses.  Data Reviewed: Chemistry: Basic metabolic panel unremarkable. ID: Blood cultures no growth 3 days. Imaging: Hepatic, nodal, osseous metastatic disease.  Scheduled Meds: . antiseptic oral rinse  15 mL Mouth Rinse BID  . ceFEPime (MAXIPIME) IV  1 g Intravenous Q8H  . ipratropium-albuterol  3 mL Nebulization TID  . rivaroxaban  20 mg Oral Q supper  . sodium chloride  3 mL Intravenous Q12H  . vancomycin  750 mg Intravenous Q12H   Continuous Infusions:    Principal Problem:   Acute respiratory failure with hypoxia Active Problems:   Pulmonary embolism  HCAP (healthcare-associated pneumonia)   COPD (chronic obstructive pulmonary disease)-PFT pending    Leukocytosis   Coagulopathy   Hypoxemia   Time spent 20 minutes.

## 2013-08-09 LAB — BASIC METABOLIC PANEL
BUN: 12 mg/dL (ref 6–23)
CO2: 25 mEq/L (ref 19–32)
Calcium: 8.7 mg/dL (ref 8.4–10.5)
Chloride: 106 mEq/L (ref 96–112)
Creatinine, Ser: 0.96 mg/dL (ref 0.50–1.35)
GFR, EST NON AFRICAN AMERICAN: 83 mL/min — AB (ref >90)
Glucose, Bld: 85 mg/dL (ref 70–99)
Potassium: 4.1 mEq/L (ref 3.7–5.3)
Sodium: 143 mEq/L (ref 137–147)

## 2013-08-09 MED ORDER — AMOXICILLIN-POT CLAVULANATE 875-125 MG PO TABS: 1.0000 | ORAL_TABLET | Freq: Two times a day (BID) | ORAL | Status: DC

## 2013-08-09 MED ORDER — AMOXICILLIN-POT CLAVULANATE 875-125 MG PO TABS
1.0000 | ORAL_TABLET | Freq: Two times a day (BID) | ORAL | Status: DC
  Administered 2013-08-09: 1 via ORAL
  Filled 2013-08-09: qty 1

## 2013-08-09 MED ORDER — NICOTINE 14 MG/24HR TD PT24: 14.0000 mg | MEDICATED_PATCH | Freq: Every day | TRANSDERMAL | Status: DC

## 2013-08-09 MED ORDER — IPRATROPIUM-ALBUTEROL 0.5-2.5 (3) MG/3ML IN SOLN: 3.0000 mL | Freq: Two times a day (BID) | RESPIRATORY_TRACT | Status: DC

## 2013-08-09 MED ORDER — ALBUTEROL SULFATE HFA 108 (90 BASE) MCG/ACT IN AERS: 2.0000 | INHALATION_SPRAY | Freq: Four times a day (QID) | RESPIRATORY_TRACT | Status: DC | PRN

## 2013-08-09 NOTE — Progress Notes (Signed)
PROGRESS NOTE  Jonathan Goodwin [id number]:[id number] DOB: [birth year] DOA: 08/05/2013 PCP: Jonathan Faster, FNP Pulmonologist: Dr. Chase Caller  Summary: 68 year old man admitted 5/29-6/1 at [facility] for submassive pulmonary embolism with right ventricular strain, left lower extremity DVT, possible healthcare associated pneumonia. He was treated empirically for pneumonia, placed initially on heparin and then discharged on Xarelto, with plans to go to 20 mg daily on 6/21.  Assessment/Plan: 1. Acute hypoxic respiratory failure. Resolved. Suspect multifactorial, suspected healthcare associated pneumonia, right side, differential does include pulmonary infarct/necrosis. Other contributors recent PE and suspected COPD. 2. Healthcare associated pneumonia. Clinically improving.  3. Submassive pulmonary embolism with right ventricular strain diagnosed 5/29, not a candidate for tPA per pulmonology at that time. Continue Xarelto. Repeat CT demonstrates decreased clot burden. 4. Mediastinal adenopathy. Hepatic, nodal and osseous metastatic disease seen on CT the abdomen pelvis. Possibly related to prostate. Results discussed with patient and wife. 5. Suspected COPD, has not yet had formal workup.  6. History of prostate cancer status post radiation 2009 7. Tobacco dependence. Recommend cessation.   Overall improved. Plan discharge home today on oral antibiotics with outpatient followup as below. Of her nicotine patch.  Outpatient followup with [place] will be arranged for further evaluation and recommendations. Follow-up PSA. Likely outpatient liver biopsy.  Should followup with Dr. Chase Caller early July for PFTs.  Discussed with wife at bedside.  [name], MD  [facility]  Pager [phone] If 7PM-7AM, please contact night-coverage at [email or web address], password [facility] 08/09/2013, 11:38 AM  LOS: 4 days   Consultants:    Procedures:    Antibiotics:  Cefepime 6/25 >>  6/28  Vancomycin 6/25 >> 6/28  Augmentin 6/28 >> 7/4  HPI/Subjective: Feeling better. Ambulating without difficulty yesterday. Breathing better. Hypoxia has resolved. Feels ready to go home.  Objective: Filed Vitals:   08/08/13 1950 08/09/13 0324 08/09/13 0513 08/09/13 0809  BP: 138/91 125/80 134/95   Pulse: 75 86 79   Temp: 98.1 F (36.7 C) 97.7 F (36.5 C) 98.4 F (36.9 C)   TempSrc: Oral Oral Oral   Resp: 20 22 22    Height:      Weight:      SpO2: 91% 95% 96% 95%    Intake/Output Summary (Last 24 hours) at 08/09/13 1138 Last data filed at 08/09/13 1034  Gross per 24 hour  Intake   1770 ml  Output   3525 ml  Net  -1755 ml     Filed Weights   08/05/13 1826  Weight: 90.719 kg (200 lb)    Exam:  Afebrile, vital signs are stable. Hypoxia resolved. Gen. Appears calm and comfortable. Nontoxic.  Psych. Grossly normal mentation. Alert, speech fluent and clear. Cardiovascular. Regular rate and rhythm. No murmur, rub gallop. No lower extremity edema. Respiratory. Fair air movement. Few wheezes. No rhonchi or rales. Normal respiratory effort.  Data Reviewed: I/O: Adequate urine output. Excellent oral intake. Chemistry: Basic metabolic panel unremarkable.  Scheduled Meds: . antiseptic oral rinse  15 mL Mouth Rinse BID  . ceFEPime (MAXIPIME) IV  1 g Intravenous Q8H  . ipratropium-albuterol  3 mL Nebulization BID  . rivaroxaban  20 mg Oral Q supper  . sodium chloride  3 mL Intravenous Q12H  . vancomycin  750 mg Intravenous Q12H   Continuous Infusions:    Principal Problem:   Acute respiratory failure with hypoxia Active Problems:   Pulmonary embolism   HCAP (healthcare-associated pneumonia)   COPD (chronic obstructive pulmonary disease)-PFT pending    Leukocytosis  Coagulopathy   Hypoxemia

## 2013-08-09 NOTE — Discharge Summary (Signed)
Physician Discharge Summary  Jonathan Goodwin FUX:[id number] DOB: 03-Jul-1945 DOA: 08/05/2013  PCP: [name], FNP  Admit date: 08/05/2013 Discharge date: 08/09/2013  Recommendations for Outpatient Follow-up:  1. Resolution of pneumonia, further evaluation for suspected COPD as planned with outpatient pulmonology appointment already scheduled. 2. Continued treatment for PE. 3. Further evaluation of hepatic, nodal and osseous metastatic disease seen on CT of the abdomen and pelvis. Plan outpatient followup with the cancer Center today the pain and pursue biopsy as outpatient. Patient and wife aware of findings and treatment plan.  4. PSA pending  5. Continue to encourage smoking cessation   Follow-up Information   Follow up with [name], FNP. Schedule an appointment as soon as possible for a visit in 1 week.   Specialty:  Family Medicine   Contact information:   [address] [place] [address] [phone]       Follow up with KEFALAS,THOMAS, PA-C. [facility] will call you for appointment)    Specialty:  Physician Assistant   Contact information:   [place] [address] [phone]      Discharge Diagnoses:  1. Acute hypoxic respiratory failure 2. Healthcare associated pneumonia 3. Possible pulmonary infarction/necrosis 4. Recent submassive pulmonary embolism, Left lower showed a DVT  5. Mediastinal adenopathy, hepatic nodal and osseous metastatic disease seen on CT of the abdomen and pelvis, possibly related to prostate  Discharge Condition: Improved  Disposition: Home  Diet recommendation: Regular  Filed Weights   08/05/13 1826  Weight: 90.719 kg (200 lb)    History of present illness:  68 year old man admitted 5/29-6/1 at [facility] for submassive pulmonary embolism with right ventricular strain, left lower extremity DVT, possible healthcare associated pneumonia. He presented to [facility] 6/24 with increasing  shortness of breath, productive cough, fever and chills. He was admitted for further treatment of hypoxic respiratory failure and pneumonia.  Hospital Course:  [name] was admitted for further evaluation of suspected pneumonia. He was placed on broad-spectrum antibiotics and his condition rapidly improved with resolution of hypoxia. Hospitalization was uncomplicated. His case was discussed with his pulmonologist Dr. Chase Caller and CT reviewed. Differential includes pulmonary infarct or necrosis. Plan for outpatient followup with pulmonology already arranged for further evaluation of COPD. CT scan of the chest did reveal suspected hepatic masses, CT of the abdomen and pelvis obtained suggested metastatic disease, see report below. Clinically the patient has improved, stable for discharge. Findings were discussed with him and his wife and plan outpatient followup with [facility] and likely liver biopsy.  1. Acute hypoxic respiratory failure. Resolved. Suspect multifactorial, suspected healthcare associated pneumonia, right side, differential does include pulmonary infarct/necrosis. Other contributors recent PE and suspected COPD. 2. Healthcare associated pneumonia. Clinically improving.  3. Submassive pulmonary embolism with right ventricular strain diagnosed 5/29, not a candidate for tPA per pulmonology at that time. Continue Xarelto. Repeat CT demonstrates decreased clot burden. 4. Mediastinal adenopathy. Hepatic, nodal and osseous metastatic disease seen on CT the abdomen pelvis. Possibly related to prostate. Results discussed with patient and wife. 5. Suspected COPD, has not yet had formal workup.  6. History of prostate cancer status post radiation 2009 7. Tobacco dependence. Recommend cessation.  Consultants: none Procedures: none Antibiotics:  Cefepime 6/25 >> 6/28  Vancomycin 6/25 >> 6/28  Augmentin 6/28 >> 7/4  Discharge Instructions     Medication List          albuterol 108 (90 BASE) MCG/ACT inhaler  Commonly known as:  PROVENTIL HFA;VENTOLIN HFA  Inhale 2 puffs into the lungs every 6 (six) hours as needed for wheezing or shortness of breath. Pharmacist please instruct in technique.     amoxicillin-clavulanate 875-125 MG per tablet  Commonly known as:  AUGMENTIN  Take 1 tablet by mouth every 12 (twelve) hours.     budesonide-formoterol 160-4.5 MCG/ACT inhaler  Commonly known as:  SYMBICORT  Inhale 2 puffs into the lungs 2 (two) times daily.     nicotine 14 mg/24hr patch  Commonly known as:  NICODERM CQ - dosed in mg/24 hours  Place 1 patch (14 mg total) onto the skin daily. Over the counter. Used to help stop smoking.     rivaroxaban 20 MG Tabs tablet  Commonly known as:  XARELTO  Take 1 tablet (20 mg total) by mouth daily with supper.       No Known Allergies  The results of significant diagnostics from this hospitalization (including imaging, microbiology, ancillary and laboratory) are listed below for reference.    Significant Diagnostic Studies: Ct Angio Chest W/cm &/or Wo Cm  08/05/2013   CLINICAL DATA:  Shortness of breath. History of kidney stents, previous pulmonary embolus, prostate cancer with radiation therapy.  EXAM: CT ANGIOGRAPHY CHEST WITH CONTRAST  TECHNIQUE: Multidetector CT imaging of the chest was performed using the standard protocol during bolus administration of intravenous contrast. Multiplanar CT image reconstructions and MIPs were obtained to evaluate the vascular anatomy.  CONTRAST:  117mL OMNIPAQUE IOHEXOL 350 MG/ML SOLN  COMPARISON:  07/10/2013  FINDINGS: Technically adequate study with moderately good opacification of the central and segmental pulmonary arteries. There is a small amount of residual eccentric thrombus demonstrated in the distal right main and proximal right lower lobe pulmonary arteries. This demonstrates improvement since prior study consistent with response to interval therapy. No new occlusive  thrombus is demonstrated. Persistent increase in the right ventricular left ventricular ratio suggesting continued right heart strain.  Mild cardiac enlargement. Normal caliber thoracic aorta without dissection. Calcification of the aorta and Coronary arteries. Again demonstrated and similar to prior study, there is evidence of lymphadenopathy in the mediastinum and bilateral hilar regions. Since the prior study, there is increasing consolidation and volume loss with air bronchograms in the right middle lung with increasing airspace disease throughout the right lower lung. Small right pleural effusion is developing since the prior study. Atelectasis or infiltration also present in the left lung base. Prominent centrilobular emphysematous changes are demonstrated in the upper lungs bilaterally. No pneumothorax.  Visualized portions of the upper abdominal organs demonstrate multiple hypo enhancing nodules throughout the liver consistent with metastasis. Spleen size is normal. Left adrenal gland nodule measuring 21 mm diameter. Bilateral renal cysts. Abdominal findings are unchanged since prior study. Mild degenerative changes in the thoracic spine. Diffuse tip bone demineralization with small heterogeneous areas of sclerosis throughout. This is likely representing bony metastasis.  Review of the MIP images confirms the above findings.  IMPRESSION: 1. Overall decreasing clot burden in the pulmonary arteries since previous study with residual nonocclusive thrombus demonstrated on the right. Persistent changes suggesting right heart strain. 2. Increasing consolidation and airspace disease in the right lung could represent progressing pneumonia, nonspecific consolidated process, or possibly obstruction due to mass or extrinsic compression. 3. Persistent changes, likely metastatic, involving mediastinal and hilar lymph nodes, multiple liver masses, left adrenal gland nodule, and heterogeneous bone sclerosis.    Electronically Signed   By: Lucienne Capers M.D.   On: 08/05/2013 21:17  Ct Angio Chest W/cm &/or Wo Cm  Ct Abdomen Pelvis W Contrast  08/08/2013   CLINICAL DATA:  Evaluate for metastatic disease. Liver and adrenal mass cyst  EXAM: CT ABDOMEN AND PELVIS WITH CONTRAST  TECHNIQUE: Multidetector CT imaging of the abdomen and pelvis was performed using the standard protocol following bolus administration of intravenous contrast.  CONTRAST:  23mL OMNIPAQUE IOHEXOL 300 MG/ML SOLN, 159mL OMNIPAQUE IOHEXOL 300 MG/ML SOLN  COMPARISON:  09/03/2008  FINDINGS: BODY WALL: Unremarkable.  LOWER CHEST: Right lower and middle lobe airspace disease and interstitial thickening, recently evaluated by chest CT. There is a small right pleural effusion. Trace pericardial effusion. Coronary atherosclerosis.  ABDOMEN/PELVIS:  Liver: There are numerous hypo enhancing masses throughout the liver. The largest measuring 3.8 cm in the inferior right lobe.  Biliary: Cholelithiasis as seen previously. There is mild pericholecystic edema. No gallbladder distention to suggest biliary obstruction.  Pancreas: Unremarkable.  Spleen: Unremarkable.  Adrenals: There are low-density nodules involving the upper and central left adrenal gland. These are stable from 2010, consistent with adenomas. The largest nodule, located in the body, measures 18 mm.  Kidneys and ureters: Bilateral renal cysts. Renal hilar calcifications appear arterial. No hydronephrosis.  Bladder: Unremarkable.  Reproductive: Fiducial markers noted within the prostate. The prostate is mildly enlarged, uplifting the bladder base, but stable from 2010.  Bowel: No obstruction.  Retroperitoneum: There are enlarged nodes in the deep liver drainage (porta hepatis and portacaval stations) with hypoenhancing centers similar to the hepatic masses. These measure approximately 12 mm in short axis. Additionally, there are newly enlarged periaortic nodes the level of the renal hila, measuring  15 mm in short axis. There is a node anterior to the right common iliac artery which is newly enlarged at 14 mm short axis.  Peritoneum: No ascites or pneumoperitoneum.  Vascular: No acute abnormality.  OSSEOUS: There are are diffuse sclerotic bone lesions in the image spine and bilateral bony pelvis. No acute fracture. There is advanced lumbar degenerative disc disease with advanced spinal canal stenosis at L3-4 and L4-5.  IMPRESSION: 1. Hepatic, nodal, and osseous metastatic disease. In this patient with history of prostate cancer, recommend PSA correlation. 2. Left adrenal nodules are stable from 2010, consistent with adenomas. 3. Cholelithiasis. There is pericholecystic edema, but no gallbladder distention to suggest cystic duct obstruction.   Electronically Signed   By: Jorje Guild M.D.   On: 08/08/2013 06:15   US Venous Img Lower Unilateral Left  Dg Chest Port 1 View  08/05/2013   CLINICAL DATA:  Shortness of breath. Hypoxia. History of prostate cancer. Recent diagnosis of extensive bilateral pulmonary embolus on 07/10/2013  EXAM: PORTABLE CHEST - 1 VIEW  COMPARISON:  Multiple exams, including 07/24/2013  FINDINGS: Continued airspace opacity in the right middle lobe. Significant increase in airspace opacity in the right lower lobe now obscuring the right hemidiaphragm.  Low lung volumes are present, causing crowding of the pulmonary vasculature. Left hilar fullness compatible with known hilar adenopathy. Interstitial accentuation and parts of the right lung specially the periphery of the mid lung.  IMPRESSION: 1. Worsened airspace opacity in the right lower lobe, potentially from pulmonary hemorrhage or pneumonia. Continued right middle lobe airspace opacity. 2. Hilar fullness on the left compatible with hilar adenopathy. 3. Low lung volumes.   Electronically Signed   By: Sherryl Barters M.D.   On: 08/05/2013 19:20    Microbiology: Recent Results (from the past 240 hour(s))  CULTURE, BLOOD  (ROUTINE X 2)  Status: None   Collection Time    08/05/13  7:55 PM      Result Value Ref Range Status   Specimen Description BLOOD LEFT ANTECUBITAL   Final   Special Requests BOTTLES DRAWN AEROBIC AND ANAEROBIC 6CC   Final   Culture NO GROWTH 4 DAYS   Final   Report Status PENDING   Incomplete  CULTURE, BLOOD (ROUTINE X 2)     Status: None   Collection Time    08/05/13  8:02 PM      Result Value Ref Range Status   Specimen Description BLOOD LEFT HAND   Final   Special Requests BOTTLES DRAWN AEROBIC AND ANAEROBIC 6CC   Final   Culture NO GROWTH 4 DAYS   Final   Report Status PENDING   Incomplete  MRSA PCR SCREENING     Status: None   Collection Time    08/06/13  5:18 PM      Result Value Ref Range Status   MRSA by PCR NEGATIVE  NEGATIVE Final   Comment:            The GeneXpert MRSA Assay (FDA     approved for NASAL specimens     only), is one component of a     comprehensive MRSA colonization     surveillance program. It is not     intended to diagnose MRSA     infection nor to guide or     monitor treatment for     MRSA infections.     Labs: Basic Metabolic Panel:  Recent Labs Lab 08/05/13 1856 08/06/13 0623 08/07/13 0543 08/09/13 0539  NA 139 140 143 143  K 5.0 4.6 4.8 4.1  CL 100 100 107 106  CO2 27 24 25 25   GLUCOSE 118* 179* 96 85  BUN 13 18 25* 12  CREATININE 1.32 1.20 1.09 0.96  CALCIUM 9.0 8.7 8.6 8.7   Liver Function Tests:  Recent Labs Lab 08/05/13 1856  AST 51*  ALT 43  ALKPHOS 277*  BILITOT 1.2  PROT 7.0  ALBUMIN 3.0*   CBC:  Recent Labs Lab 08/05/13 1856 08/06/13 0623  WBC 15.1* 12.6*  NEUTROABS 12.9*  --   HGB 15.9 14.9  HCT 47.4 44.6  MCV 90.3 90.5  PLT 151 138*   Cardiac Enzymes:  Recent Labs Lab 08/05/13 1856  TROPONINI <0.30     Recent Labs  07/10/13 1014 08/05/13 1856  PROBNP 1349.0* 204.3*    Principal Problem:   Acute respiratory failure with hypoxia Active Problems:   Pulmonary embolism   HCAP  (healthcare-associated pneumonia)   COPD (chronic obstructive pulmonary disease)-PFT pending    Leukocytosis   Coagulopathy   Hypoxemia   Time coordinating discharge: 25 minutes  Signed:  [name], MD [facility] 08/09/2013, 12:11 PM

## 2013-08-09 NOTE — Discharge Planning (Signed)
Pt stated he was ready to be [facility] and he had no pain.  Pt's IV was removed.  Pt educated on further s/sx of future infection.  Pt given DC paper and education with family in the room.  Pt told of needed FU appointments and also where to get scripts.  Pt will be wheeled to car by myself and family.

## 2013-08-10 ENCOUNTER — Telehealth: Payer: Self-pay | Admitting: Family Medicine

## 2013-08-10 ENCOUNTER — Other Ambulatory Visit (HOSPITAL_COMMUNITY): Payer: Self-pay | Admitting: Family

## 2013-08-10 DIAGNOSIS — B0803 Pseudocowpox [milker's node]: Secondary | ICD-10-CM

## 2013-08-10 LAB — CULTURE, BLOOD (ROUTINE X 2)
CULTURE: NO GROWTH
Culture: NO GROWTH

## 2013-08-10 NOTE — Telephone Encounter (Signed)
Discussed with PCP Burman Freestone, reviewed hospitalization with her.  She will refer for outpatient liver biopsy to further evaluate liver lesions.   She will then coordinate follow-up with [facility] (by patient request) for further recommendations.  [name], MD [facility] [phone]

## 2013-08-11 LAB — PSA: PSA: 0.27 ng/mL (ref <=4.00)

## 2013-08-12 ENCOUNTER — Other Ambulatory Visit: Payer: Self-pay | Admitting: Radiology

## 2013-08-13 ENCOUNTER — Other Ambulatory Visit: Payer: Self-pay | Admitting: Radiology

## 2013-08-14 ENCOUNTER — Encounter (HOSPITAL_COMMUNITY): Payer: Self-pay | Admitting: Pharmacy Technician

## 2013-08-16 ENCOUNTER — Encounter (HOSPITAL_COMMUNITY): Payer: Self-pay | Admitting: Emergency Medicine

## 2013-08-16 ENCOUNTER — Emergency Department (HOSPITAL_COMMUNITY): Payer: Medicare Other

## 2013-08-16 ENCOUNTER — Inpatient Hospital Stay (HOSPITAL_COMMUNITY)
Admission: EM | Admit: 2013-08-16 | Discharge: 2013-08-25 | DRG: 871 | Disposition: A | Discharge status: Home Health | Payer: Medicare Other | Attending: Family Medicine | Admitting: Family Medicine

## 2013-08-16 DIAGNOSIS — I2782 Chronic pulmonary embolism: Secondary | ICD-10-CM | POA: Diagnosis present

## 2013-08-16 DIAGNOSIS — I2699 Other pulmonary embolism without acute cor pulmonale: Secondary | ICD-10-CM

## 2013-08-16 DIAGNOSIS — J9600 Acute respiratory failure, unspecified whether with hypoxia or hypercapnia: Secondary | ICD-10-CM | POA: Diagnosis present

## 2013-08-16 DIAGNOSIS — C61 Malignant neoplasm of prostate: Secondary | ICD-10-CM

## 2013-08-16 DIAGNOSIS — J151 Pneumonia due to Pseudomonas: Secondary | ICD-10-CM | POA: Diagnosis present

## 2013-08-16 DIAGNOSIS — C3491 Malignant neoplasm of unspecified part of right bronchus or lung: Secondary | ICD-10-CM | POA: Diagnosis present

## 2013-08-16 DIAGNOSIS — J441 Chronic obstructive pulmonary disease with (acute) exacerbation: Secondary | ICD-10-CM

## 2013-08-16 DIAGNOSIS — Z923 Personal history of irradiation: Secondary | ICD-10-CM

## 2013-08-16 DIAGNOSIS — J9601 Acute respiratory failure with hypoxia: Secondary | ICD-10-CM

## 2013-08-16 DIAGNOSIS — R509 Fever, unspecified: Secondary | ICD-10-CM

## 2013-08-16 DIAGNOSIS — J910 Malignant pleural effusion: Secondary | ICD-10-CM | POA: Diagnosis present

## 2013-08-16 DIAGNOSIS — Z86711 Personal history of pulmonary embolism: Secondary | ICD-10-CM

## 2013-08-16 DIAGNOSIS — M129 Arthropathy, unspecified: Secondary | ICD-10-CM | POA: Diagnosis present

## 2013-08-16 DIAGNOSIS — J15212 Pneumonia due to Methicillin resistant Staphylococcus aureus: Secondary | ICD-10-CM | POA: Diagnosis present

## 2013-08-16 DIAGNOSIS — C7952 Secondary malignant neoplasm of bone marrow: Secondary | ICD-10-CM

## 2013-08-16 DIAGNOSIS — C3490 Malignant neoplasm of unspecified part of unspecified bronchus or lung: Secondary | ICD-10-CM | POA: Diagnosis present

## 2013-08-16 DIAGNOSIS — A419 Sepsis, unspecified organism: Principal | ICD-10-CM

## 2013-08-16 DIAGNOSIS — D72829 Elevated white blood cell count, unspecified: Secondary | ICD-10-CM

## 2013-08-16 DIAGNOSIS — F17200 Nicotine dependence, unspecified, uncomplicated: Secondary | ICD-10-CM | POA: Diagnosis present

## 2013-08-16 DIAGNOSIS — J90 Pleural effusion, not elsewhere classified: Secondary | ICD-10-CM

## 2013-08-16 DIAGNOSIS — H9190 Unspecified hearing loss, unspecified ear: Secondary | ICD-10-CM | POA: Diagnosis present

## 2013-08-16 DIAGNOSIS — C787 Secondary malignant neoplasm of liver and intrahepatic bile duct: Secondary | ICD-10-CM | POA: Diagnosis present

## 2013-08-16 DIAGNOSIS — H3530 Unspecified macular degeneration: Secondary | ICD-10-CM | POA: Diagnosis present

## 2013-08-16 DIAGNOSIS — J449 Chronic obstructive pulmonary disease, unspecified: Secondary | ICD-10-CM | POA: Diagnosis present

## 2013-08-16 DIAGNOSIS — C7951 Secondary malignant neoplasm of bone: Secondary | ICD-10-CM | POA: Diagnosis present

## 2013-08-16 DIAGNOSIS — J189 Pneumonia, unspecified organism: Secondary | ICD-10-CM | POA: Diagnosis present

## 2013-08-16 DIAGNOSIS — Z7901 Long term (current) use of anticoagulants: Secondary | ICD-10-CM

## 2013-08-16 DIAGNOSIS — R911 Solitary pulmonary nodule: Secondary | ICD-10-CM

## 2013-08-16 DIAGNOSIS — D696 Thrombocytopenia, unspecified: Secondary | ICD-10-CM | POA: Diagnosis present

## 2013-08-16 DIAGNOSIS — J439 Emphysema, unspecified: Secondary | ICD-10-CM

## 2013-08-16 HISTORY — DX: Malignant neoplasm of unspecified part of right bronchus or lung: C34.91

## 2013-08-16 HISTORY — DX: Chronic obstructive pulmonary disease, unspecified: J44.9

## 2013-08-16 HISTORY — DX: Acute embolism and thrombosis of unspecified deep veins of unspecified lower extremity: I82.409

## 2013-08-16 LAB — CBC
HCT: 51.1 % (ref 39.0–52.0)
Hemoglobin: 16.9 g/dL (ref 13.0–17.0)
MCH: 29.7 pg (ref 26.0–34.0)
MCHC: 33.1 g/dL (ref 30.0–36.0)
MCV: 89.8 fL (ref 78.0–100.0)
Platelets: 159 10*3/uL (ref 150–400)
RBC: 5.69 MIL/uL (ref 4.22–5.81)
RDW: 15.2 % (ref 11.5–15.5)
WBC: 16.7 10*3/uL — ABNORMAL HIGH (ref 4.0–10.5)

## 2013-08-16 LAB — BASIC METABOLIC PANEL
Anion gap: 17 — ABNORMAL HIGH (ref 5–15)
BUN: 17 mg/dL (ref 6–23)
CO2: 25 mEq/L (ref 19–32)
Calcium: 8.9 mg/dL (ref 8.4–10.5)
Chloride: 96 mEq/L (ref 96–112)
Creatinine, Ser: 1.29 mg/dL (ref 0.50–1.35)
GFR, EST AFRICAN AMERICAN: 64 mL/min — AB (ref >90)
GFR, EST NON AFRICAN AMERICAN: 55 mL/min — AB (ref >90)
Glucose, Bld: 160 mg/dL — ABNORMAL HIGH (ref 70–99)
Potassium: 5 mEq/L (ref 3.7–5.3)
SODIUM: 138 meq/L (ref 137–147)

## 2013-08-16 LAB — PRO B NATRIURETIC PEPTIDE: PRO B NATRI PEPTIDE: 230.8 pg/mL — AB (ref 0–125)

## 2013-08-16 LAB — TROPONIN I: Troponin I: <0.3 ng/mL (ref <0.30)

## 2013-08-16 MED ORDER — ALBUTEROL (5 MG/ML) CONTINUOUS INHALATION SOLN: 15.0000 mg/h | INHALATION_SOLUTION | Freq: Once | RESPIRATORY_TRACT | Status: DC

## 2013-08-16 MED ORDER — VANCOMYCIN HCL IN DEXTROSE 750-5 MG/150ML-% IV SOLN
INTRAVENOUS | Status: AC
  Filled 2013-08-16: qty 150

## 2013-08-16 MED ORDER — ALUM & MAG HYDROXIDE-SIMETH 200-200-20 MG/5ML PO SUSP: 30.0000 mL | Freq: Four times a day (QID) | ORAL | Status: DC | PRN

## 2013-08-16 MED ORDER — FOLIC ACID 1 MG PO TABS
1.0000 mg | ORAL_TABLET | Freq: Every day | ORAL | Status: DC
  Administered 2013-08-16 – 2013-08-25 (×10): 1 mg via ORAL
  Filled 2013-08-16 (×10): qty 1

## 2013-08-16 MED ORDER — VANCOMYCIN HCL IN DEXTROSE 750-5 MG/150ML-% IV SOLN
750.0000 mg | Freq: Two times a day (BID) | INTRAVENOUS | Status: DC
  Administered 2013-08-17 – 2013-08-23 (×13): 750 mg via INTRAVENOUS
  Filled 2013-08-16 (×15): qty 150

## 2013-08-16 MED ORDER — ALBUTEROL SULFATE (2.5 MG/3ML) 0.083% IN NEBU
2.5000 mg | INHALATION_SOLUTION | Freq: Four times a day (QID) | RESPIRATORY_TRACT | Status: DC | PRN
  Administered 2013-08-16 – 2013-08-19 (×3): 2.5 mg via RESPIRATORY_TRACT
  Filled 2013-08-16 (×3): qty 3

## 2013-08-16 MED ORDER — ONDANSETRON HCL 4 MG PO TABS: 4.0000 mg | ORAL_TABLET | Freq: Four times a day (QID) | ORAL | Status: DC | PRN

## 2013-08-16 MED ORDER — RIVAROXABAN 20 MG PO TABS
20.0000 mg | ORAL_TABLET | Freq: Every day | ORAL | Status: DC
  Administered 2013-08-16: 20 mg via ORAL
  Filled 2013-08-16: qty 1

## 2013-08-16 MED ORDER — DEXTROSE 5 % IV SOLN
INTRAVENOUS | Status: AC
  Filled 2013-08-16 (×2): qty 2

## 2013-08-16 MED ORDER — ADULT MULTIVITAMIN W/MINERALS CH
1.0000 | ORAL_TABLET | Freq: Every day | ORAL | Status: DC
  Administered 2013-08-16 – 2013-08-25 (×10): 1 via ORAL
  Filled 2013-08-16 (×10): qty 1

## 2013-08-16 MED ORDER — BUDESONIDE-FORMOTEROL FUMARATE 160-4.5 MCG/ACT IN AERO
2.0000 | INHALATION_SPRAY | Freq: Two times a day (BID) | RESPIRATORY_TRACT | Status: DC
  Administered 2013-08-16 – 2013-08-25 (×18): 2 via RESPIRATORY_TRACT
  Filled 2013-08-16: qty 6

## 2013-08-16 MED ORDER — DEXTROSE 5 % IV SOLN
1.0000 g | Freq: Once | INTRAVENOUS | Status: AC
  Administered 2013-08-16: 1 g via INTRAVENOUS
  Filled 2013-08-16: qty 1

## 2013-08-16 MED ORDER — DEXTROSE 5 % IV SOLN
2.0000 g | Freq: Three times a day (TID) | INTRAVENOUS | Status: DC
  Administered 2013-08-16 – 2013-08-22 (×17): 2 g via INTRAVENOUS
  Filled 2013-08-16 (×24): qty 2

## 2013-08-16 MED ORDER — METHYLPREDNISOLONE SODIUM SUCC 125 MG IJ SOLR
125.0000 mg | Freq: Once | INTRAMUSCULAR | Status: AC
  Administered 2013-08-16: 125 mg via INTRAVENOUS
  Filled 2013-08-16: qty 2

## 2013-08-16 MED ORDER — BIOTENE DRY MOUTH MT LIQD
15.0000 mL | Freq: Two times a day (BID) | OROMUCOSAL | Status: DC
  Administered 2013-08-16 – 2013-08-25 (×14): 15 mL via OROMUCOSAL

## 2013-08-16 MED ORDER — BUDESONIDE-FORMOTEROL FUMARATE 160-4.5 MCG/ACT IN AERO
INHALATION_SPRAY | RESPIRATORY_TRACT | Status: AC
  Filled 2013-08-16: qty 6

## 2013-08-16 MED ORDER — ACETAMINOPHEN 650 MG RE SUPP
650.0000 mg | Freq: Four times a day (QID) | RECTAL | Status: DC | PRN
Start: 2013-08-16 — End: 2013-08-25

## 2013-08-16 MED ORDER — OXYCODONE HCL 5 MG PO TABS
5.0000 mg | ORAL_TABLET | ORAL | Status: DC | PRN
  Administered 2013-08-18 – 2013-08-25 (×9): 5 mg via ORAL
  Filled 2013-08-16 (×10): qty 1

## 2013-08-16 MED ORDER — VANCOMYCIN HCL IN DEXTROSE 1-5 GM/200ML-% IV SOLN
1000.0000 mg | Freq: Once | INTRAVENOUS | Status: AC
  Administered 2013-08-16: 1000 mg via INTRAVENOUS
  Filled 2013-08-16: qty 200

## 2013-08-16 MED ORDER — ALBUTEROL (5 MG/ML) CONTINUOUS INHALATION SOLN
10.0000 mg/h | INHALATION_SOLUTION | Freq: Once | RESPIRATORY_TRACT | Status: AC
  Administered 2013-08-16: 10 mg/h via RESPIRATORY_TRACT
  Filled 2013-08-16: qty 20

## 2013-08-16 MED ORDER — ACETAMINOPHEN 325 MG PO TABS: 650.0000 mg | ORAL_TABLET | Freq: Four times a day (QID) | ORAL | Status: DC | PRN

## 2013-08-16 MED ORDER — ACETAMINOPHEN 500 MG PO TABS
1000.0000 mg | ORAL_TABLET | Freq: Once | ORAL | Status: AC
  Administered 2013-08-16: 1000 mg via ORAL
  Filled 2013-08-16: qty 2

## 2013-08-16 MED ORDER — IPRATROPIUM BROMIDE 0.02 % IN SOLN
1.0000 mg | Freq: Once | RESPIRATORY_TRACT | Status: AC
  Administered 2013-08-16: 1 mg via RESPIRATORY_TRACT
  Filled 2013-08-16: qty 5

## 2013-08-16 MED ORDER — SODIUM CHLORIDE 0.9 % IV SOLN
INTRAVENOUS | Status: DC
  Administered 2013-08-16 – 2013-08-18 (×3): via INTRAVENOUS

## 2013-08-16 MED ORDER — VITAMIN B-1 100 MG PO TABS
100.0000 mg | ORAL_TABLET | Freq: Every day | ORAL | Status: DC
  Administered 2013-08-16 – 2013-08-25 (×10): 100 mg via ORAL
  Filled 2013-08-16 (×10): qty 1

## 2013-08-16 MED ORDER — IOHEXOL 350 MG/ML SOLN
100.0000 mL | Freq: Once | INTRAVENOUS | Status: AC | PRN
  Administered 2013-08-16: 100 mL via INTRAVENOUS

## 2013-08-16 MED ORDER — ONDANSETRON HCL 4 MG/2ML IJ SOLN: 4.0000 mg | Freq: Four times a day (QID) | INTRAMUSCULAR | Status: DC | PRN

## 2013-08-16 NOTE — Progress Notes (Signed)
ANTIBIOTIC CONSULT NOTE - INITIAL  Pharmacy Consult for Vancomycin & Aztreonam Indication: pneumonia  No Known Allergies  Patient Measurements: Height: 6\' 1"  (185.4 cm) Weight: 170 lb (77.111 kg) IBW/kg (Calculated) : 79.9  Vital Signs: Temp: 99.4 F (37.4 C) (07/05 1847) Temp src: Oral (07/05 1847) BP: 101/73 mmHg (07/05 1900) Pulse Rate: 111 (07/05 1900) Intake/Output from previous day:   Intake/Output from this shift:    Labs:  Recent Labs  08/16/13 1509  WBC 16.7*  HGB 16.9  PLT 159  CREATININE 1.29   Estimated Creatinine Clearance: 58.9 ml/min (by C-G formula based on Cr of 1.29). No results found for this basename: VANCOTROUGH, Corlis Leak, VANCORANDOM, [place], GENTPEAK, [place], [place], TOBRAPEAK, TOBRARND, AMIKACINPEAK, AMIKACINTROU, AMIKACIN,  in the last 72 hours   Microbiology: Recent Results (from the past 720 hour(s))  CULTURE, BLOOD (ROUTINE X 2)     Status: None   Collection Time    08/05/13  7:55 PM      Result Value Ref Range Status   Specimen Description BLOOD LEFT ANTECUBITAL   Final   Special Requests BOTTLES DRAWN AEROBIC AND ANAEROBIC [place]   Final   Culture NO GROWTH 5 DAYS   Final   Report Status 08/10/2013 FINAL   Final  CULTURE, BLOOD (ROUTINE X 2)     Status: None   Collection Time    08/05/13  8:02 PM      Result Value Ref Range Status   Specimen Description BLOOD LEFT HAND   Final   Special Requests BOTTLES DRAWN AEROBIC AND ANAEROBIC [place]   Final   Culture NO GROWTH 5 DAYS   Final   Report Status 08/10/2013 FINAL   Final  MRSA PCR SCREENING     Status: None   Collection Time    08/06/13  5:18 PM      Result Value Ref Range Status   MRSA by PCR NEGATIVE  NEGATIVE Final   Comment:            The GeneXpert MRSA Assay (FDA     approved for NASAL specimens     only), is one component of a     comprehensive MRSA colonization     surveillance program. It is not     intended to diagnose MRSA     infection nor to guide or      monitor treatment for     MRSA infections.    Medical History: Past Medical History  Diagnosis Date  . H/O renal calculi   . Urethral stricture   . Wears hearing aid     right ear  . Hearing impaired person, right   . Arthritis   . Macular degeneration   . Ulcerative colitis   . History of kidney stones   . Pulmonary embolus 07/10/2013  . DVT, lower extremity 07/10/2013    LLE  . COPD (chronic obstructive pulmonary disease)   . Prostate cancer 10/24/07    radiation therapy  . Metastatic disease     unknown source, found on CT A/P 08/08/2013    Medications:  Scheduled:  . antiseptic oral rinse  15 mL Mouth Rinse BID  . aztreonam  2 g Intravenous Q8H  . budesonide-formoterol  2 puff Inhalation BID  . folic acid  1 mg Oral Daily  . multivitamin with minerals  1 tablet Oral Daily  . rivaroxaban  20 mg Oral Q supper  . thiamine  100 mg Oral Daily  . [START ON 08/17/2013] vancomycin  750 mg Intravenous Q12H   Assessment: 68 yo M with recurrent PNA.  He has been hospitalized recently for PNA so plan to treat with empiric, broad-spectrum antibiotics for HCAP.   WBC is elevated & patient was febrile on admission.  CXR + PNA. Renal function is slightly elevated above patient's baseline.   Vancomycin 7/6>> Aztreonam 7/6>>  Goal of Therapy:  Vancomycin trough level 15-20 mcg/ml  Plan:  Continue Aztreonam Vancomycin 750mg  IV q12h Check Vancomycin trough at steady state Monitor renal function and cx data   Biagio Borg 08/16/2013,9:16 PM

## 2013-08-16 NOTE — H&P (Addendum)
[facility] History and Physical  DEMETRIS CAPELL RVU:[id number] DOB: 23-Mar-1945 DOA: 08/16/2013  Referring physician: Francine Graven, DO PCP: [name], FNP   Chief Complaint: Pneumonia  HPI: Jonathan Goodwin is a 68 y.o. male with recurring bouts of pneumonia presents to the ED after a recent discharge for the same. Patient sates that he has noted increased cough and shortness of breath. He states he cannot lay down. He states that whenever he comes in for IV antibiotics he appears to improve but when he is switched to oral treatment and sent home his symptoms seem to get worse. Patient states that he has noted wheeze. He also states that he has had prostate cancer being followed by urology. Recently he was noted to have disease in the liver and is actually scheduled for a biopsy. He states that he is also to see oncology for the lung and chest mets noted on the scan.    Review of Systems:  Constitutional:  +weight loss, night sweats, Fevers, chills, fatigue.  HEENT:  No headaches,post nasal drip,  Cardio-vascular:  ++chest pain and soreness, no swelling in lower extremities, palpitations  GI:  No heartburn, indigestion, abdominal pain, nausea, vomiting, diarrhea  Resp:  ++shortness of breath with exertion. ++coughing up of blood Skin:  no rash or lesions.  GU:  no dysuria, change in color of urine.  Musculoskeletal:  No joint pain or swelling. No decreased range of motion. No back pain.  Psych:  No change in mood or affect. No depression or anxiety. No memory loss.   Past Medical History  Diagnosis Date  . H/O renal calculi   . Urethral stricture   . Wears hearing aid     right ear  . Hearing impaired person, right   . Arthritis   . Macular degeneration   . Ulcerative colitis   . History of kidney stones   . Pulmonary embolus 07/10/2013  . DVT, lower extremity 07/10/2013    LLE  . COPD (chronic obstructive pulmonary disease)   . Prostate cancer 10/24/07   radiation therapy  . Metastatic disease     unknown source, found on CT A/P 08/08/2013   Past Surgical History  Procedure Laterality Date  . Knee arthroscopy  1993    right  . Appendectomy  2010    [facility], Dr. Romona Curls  . Shoulder surgery      left, for chronic dislocation  . Cystoscopy  2010    [facility], Dr. Maryland Pink  . Urethrotomy  06/12/2011    Procedure: CYSTOSCOPY/URETHROTOMY;  Surgeon: Marissa Nestle, MD;  Location: [facility];  Service: Urology;  Laterality: N/A;  optical urethrotomy  . Cataract extraction w/phaco Left 01/15/2013    Procedure: CATARACT EXTRACTION PHACO AND INTRAOCULAR LENS PLACEMENT (IOC);  Surgeon: Tonny Branch, MD;  Location: [facility];  Service: Ophthalmology;  Laterality: Left;  CDE:22.42  . Cystoscopy with urethral dilatation N/A 06/30/2013    Procedure: CYSTOSCOPY WITH URETHRAL DILATATION;  Surgeon: Marissa Nestle, MD;  Location: [facility];  Service: Urology;  Laterality: N/A;  . Rectal exam under anesthesia N/A 06/30/2013    Procedure: RECTAL EXAM UNDER ANESTHESIA;  Surgeon: Marissa Nestle, MD;  Location: [facility];  Service: Urology;  Laterality: N/A;  . Urethrotomy N/A 06/30/2013    Procedure: CYSTOSCOPY/URETHROTOMY;  Surgeon: Marissa Nestle, MD;  Location: [facility];  Service: Urology;  Laterality: N/A;  . Cystoscopy 2015     Social History:  reports that he has been smoking Cigarettes.  He has a 58 pack-year smoking history. He has never used smokeless tobacco. He reports that he drinks alcohol. He reports that he does not use illicit drugs.  No Known Allergies  Family History  Problem Relation Age of Onset  . Cancer Mother     colon  . Cancer Paternal Grandmother     colon     Prior to Admission medications   Medication Sig Start Date End Date Taking? Authorizing Provider  albuterol (PROAIR HFA) 108 (90 BASE) MCG/ACT inhaler Inhale 2 puffs into the lungs every 6 (six) hours as needed for wheezing or shortness of breath.   Yes Historical Provider, MD    amoxicillin-clavulanate (AUGMENTIN) 875-125 MG per tablet Take 1 tablet by mouth every 12 (twelve) hours. Takes for 7 days.  Dosing began 08/09/2013.   Yes Historical Provider, MD  budesonide-formoterol (SYMBICORT) 160-4.5 MCG/ACT inhaler Inhale 2 puffs into the lungs 2 (two) times daily. 07/13/13  Yes Donita Brooks, NP  rivaroxaban (XARELTO) 20 MG TABS tablet Take 1 tablet (20 mg total) by mouth daily with supper. 08/02/13  Yes Donita Brooks, NP   Physical Exam: Filed Vitals:   08/16/13 1847  BP: 104/70  Pulse: 112  Temp: 99.4 F (37.4 C)  Resp: 21    BP 104/70  Pulse 112  Temp(Src) 99.4 F (37.4 C) (Oral)  Resp 21  Ht 6\' 1"  (1.854 m)  Wt 77.111 kg (170 lb)  BMI 22.43 kg/m2  SpO2 93%  General:  Appears calm and comfortable Eyes: PERRL, normal lids, irises & conjunctiva ENT: grossly normal hearing, lips & tongue Neck: no LAD, masses or thyromegaly Cardiovascular: RRR, no m/r/g. No LE edema. Respiratory: CTA bilaterally, few scattered ronchi. Normal respiratory effort. Abdomen: soft, ntnd but distended Skin: no rash or induration seen on limited exam Musculoskeletal: grossly normal tone BUE/BLE Psychiatric: grossly normal mood and affect, speech fluent and appropriate Neurologic: grossly non-focal.          Labs on Admission:  Basic Metabolic Panel:  Recent Labs Lab 08/16/13 1509  NA 138  K 5.0  CL 96  CO2 25  GLUCOSE 160*  BUN 17  CREATININE 1.29  CALCIUM 8.9   Liver Function Tests: No results found for this basename: AST, ALT, ALKPHOS, BILITOT, PROT, ALBUMIN,  in the last 168 hours No results found for this basename: LIPASE, AMYLASE,  in the last 168 hours No results found for this basename: AMMONIA,  in the last 168 hours CBC:  Recent Labs Lab 08/16/13 1509  WBC 16.7*  HGB 16.9  HCT 51.1  MCV 89.8  PLT 159   Cardiac Enzymes:  Recent Labs Lab 08/16/13 1509  TROPONINI <0.30    BNP (last 3 results)  Recent Labs  07/10/13 1014  08/05/13 1856 08/16/13 1509  PROBNP 1349.0* 204.3* 230.8*   CBG: No results found for this basename: GLUCAP,  in the last 168 hours  Radiological Exams on Admission: Ct Angio Chest Pe W/cm &/or Wo Cm  08/16/2013   CLINICAL DATA:  Shortness of breath.  EXAM: CT ANGIOGRAPHY CHEST WITH CONTRAST  TECHNIQUE: Multidetector CT imaging of the chest was performed using the standard protocol during bolus administration of intravenous contrast. Multiplanar CT image reconstructions and MIPs were obtained to evaluate the vascular anatomy.  CONTRAST:  156mL OMNIPAQUE IOHEXOL 350 MG/ML SOLN  COMPARISON:  08/05/2013  FINDINGS: Small amount of residual eccentric chronic what again seen within the main right pulmonary artery and proximal right lower lobe pulmonary artery, not  significantly changed. No new pulmonary embolus.  Increasing right pleural effusion, now moderate. Continued volume loss and air bronchograms within the right middle lobe, stable. Increasing airspace disease in the right lower lobe. Moderate emphysematous changes. Minimal scattered opacities noted in the left lower lobe at the left lung base and in the lingula.  Small nodule in the lingula measures 5 mm on image 60, stable. Adenopathy in the mediastinum and hila again noted, unchanged. AP window lymph node has a short axis diameter of 16 mm on image 37. Left hilar lymph node has a short axis diameter of 14 mm on image 41. Left hilar lymph node on image 53 as a short axis diameter of 19 mm. Other enlarged AP window, left hilar and paratracheal lymph nodes are also stable.  Heart is borderline in size. Coronary artery calcifications. Aorta is normal caliber.  Imaging into the upper abdomen again demonstrates multiple low-density lesions throughout the liver compatible with metastases, likely not significantly changed. Sclerotic foci throughout the thoracic spine are stable, likely bone metastases.  Review of the MIP images confirms the above findings.   IMPRESSION: Stable chronic small pulmonary embolus in the right main and lower lobe pulmonary arteries. This is nonocclusive. No new pulmonary embolus.  Enlarging right pleural effusion with increasing right lower lobe consolidation. Stable volume loss and air bronchograms in the medial right middle lobe.  Stable mediastinal and left hilar adenopathy compatible with metastatic disease. Stable multiple ill-defined hepatic metastases. Stable sclerotic bone metastases.   Electronically Signed   By: Rolm Baptise M.D.   On: 08/16/2013 18:26   Dg Chest Port 1 View  08/16/2013   CLINICAL DATA:  SHORTNESS OF BREATH  EXAM: PORTABLE CHEST - 1 VIEW  COMPARISON:  Portable chest radiograph 08/05/2013  FINDINGS: Visualized cardiac silhouette stable. Persistent right lower lobe opacity with increased conspicuity silhouetting the right heart border. There is prominence of interstitial markings. Stable fullness in the left hilar region. No new focal regions of consolidation or new focal infiltrates. Osteoarthritic changes in the shoulders. No acute osseous abnormalities.  IMPRESSION: Stable pulmonary opacity right lower lobe. Differential considerations again include pneumonia, pulmonary hemorrhage, component of atelectasis.  Chest radiograph otherwise stable.   Electronically Signed   By: Margaree Mackintosh M.D.   On: 08/16/2013 15:35     Assessment/Plan Active Problems:   Pulmonary embolism   Lung nodule   HCAP (healthcare-associated pneumonia)   COPD (chronic obstructive pulmonary disease)-PFT pending    Pneumonia   1. Pneumonia -patient has had multiple admissions for this -will place on IV antibiotics -cultures done -since he has been in the hospital will treat as HCAP  2. Pulmonary embolism -on anticoagulation -will continue with xaraelto  3. COPD -continue with inhalers as needed -states he has not smoked for 2 weeks  4. Prostate cancer -in process of being treated -will monitor  5. Liver  metastatic disease -patient has an outpatient appointment for a liver biospy -in addition the CT scan shows presence of disease in the Chest  6. Pleural Effusion -would consider a thoracentesis -send fluid for cytology also in addition to routine tests  Code Status: Full Code (must indicate code status--if unknown or must be presumed, indicate so) Family Communication: Wife (indicate person spoken with, if applicable, with phone number if by telephone) Disposition Plan: Home (indicate anticipated LOS)  Time spent: 86min  KHAN,SAADAT A [facility] Pager [phone]  **Disclaimer: This note may have been dictated with voice recognition software. Similar sounding words can  inadvertently be transcribed and this note may contain transcription errors which may not have been corrected upon publication of note.**

## 2013-08-16 NOTE — ED Provider Notes (Signed)
CSN: [id number]     Arrival date & time 08/16/13  1446 History   First MD Initiated Contact with Patient 08/16/13 1456     Chief Complaint  Patient presents with  . Shortness of Breath     HPI Pt was seen at 1455.  Per EMS and pt report, c/o gradual onset and worsening of persistent cough, wheezing and SOB since this morning. Has been using home MDI without relief. Pt was discharged on 08/09/2013 for dx COPD exacerbation, PE, possible pneumonia and new metastatic disease. Pt received short neb by EMS en route without change in symptoms. Pt endorses compliance with his medications. Denies CP/palpitations, no back pain, no abd pain, no N/V/D, no fevers, no rash.    Past Medical History  Diagnosis Date  . H/O renal calculi   . Urethral stricture   . Wears hearing aid     right ear  . Hearing impaired person, right   . Arthritis   . Macular degeneration   . Ulcerative colitis   . History of kidney stones   . Pulmonary embolus 07/10/2013  . DVT, lower extremity 07/10/2013    LLE  . COPD (chronic obstructive pulmonary disease)   . Prostate cancer 10/24/07    radiation therapy  . Metastatic disease     unknown source, found on CT A/P 08/08/2013   Past Surgical History  Procedure Laterality Date  . Knee arthroscopy  1993    right  . Appendectomy  2010    [facility], Dr. Romona Curls  . Shoulder surgery      left, for chronic dislocation  . Cystoscopy  2010    [facility], Dr. Maryland Pink  . Urethrotomy  06/12/2011    Procedure: CYSTOSCOPY/URETHROTOMY;  Surgeon: Marissa Nestle, MD;  Location: [facility];  Service: Urology;  Laterality: N/A;  optical urethrotomy  . Cataract extraction w/phaco Left 01/15/2013    Procedure: CATARACT EXTRACTION PHACO AND INTRAOCULAR LENS PLACEMENT (IOC);  Surgeon: Tonny Branch, MD;  Location: [facility];  Service: Ophthalmology;  Laterality: Left;  CDE:22.42  . Cystoscopy with urethral dilatation N/A 06/30/2013    Procedure: CYSTOSCOPY WITH URETHRAL DILATATION;  Surgeon: Marissa Nestle, MD;  Location: [facility];  Service: Urology;  Laterality: N/A;  . Rectal exam under anesthesia N/A 06/30/2013    Procedure: RECTAL EXAM UNDER ANESTHESIA;  Surgeon: Marissa Nestle, MD;  Location: [facility];  Service: Urology;  Laterality: N/A;  . Urethrotomy N/A 06/30/2013    Procedure: CYSTOSCOPY/URETHROTOMY;  Surgeon: Marissa Nestle, MD;  Location: [facility];  Service: Urology;  Laterality: N/A;  . Cystoscopy 2015     Family History  Problem Relation Age of Onset  . Cancer Mother     colon  . Cancer Paternal Grandmother     colon   History  Substance Use Topics  . Smoking status: Current Every Day Smoker -- 1.00 packs/day for 58 years    Types: Cigarettes  . Smokeless tobacco: Never Used     Comment: trying to quit.  . Alcohol Use: Yes     Comment: occasional    Review of Systems ROS: Statement: All systems negative except as marked or noted in the HPI; Constitutional: Negative for fever and chills. ; ; Eyes: Negative for eye pain, redness and discharge. ; ; ENMT: Negative for ear pain, hoarseness, nasal congestion, sinus pressure and sore throat. ; ; Cardiovascular: Negative for chest pain, palpitations, diaphoresis, and peripheral edema. ; ; Respiratory: +cough, wheezing, SOB. Negative for stridor. ; ;  Gastrointestinal: Negative for nausea, vomiting, diarrhea, abdominal pain, blood in stool, hematemesis, jaundice and rectal bleeding. . ; ; Genitourinary: Negative for dysuria, flank pain and hematuria. ; ; Musculoskeletal: Negative for back pain and neck pain. Negative for swelling and trauma.; ; Skin: Negative for pruritus, rash, abrasions, blisters, bruising and skin lesion.; ; Neuro: Negative for headache, lightheadedness and neck stiffness. Negative for weakness, altered level of consciousness , altered mental status, extremity weakness, paresthesias, involuntary movement, seizure and syncope.      Allergies  Review of patient's allergies indicates no known allergies.  Home  Medications   Prior to Admission medications   Medication Sig Start Date End Date Taking? Authorizing Provider  albuterol (PROAIR HFA) 108 (90 BASE) MCG/ACT inhaler Inhale 2 puffs into the lungs every 6 (six) hours as needed for wheezing or shortness of breath.   Yes Historical Provider, MD  amoxicillin-clavulanate (AUGMENTIN) 875-125 MG per tablet Take 1 tablet by mouth every 12 (twelve) hours. Takes for 7 days.  Dosing began 08/09/2013.   Yes Historical Provider, MD  budesonide-formoterol (SYMBICORT) 160-4.5 MCG/ACT inhaler Inhale 2 puffs into the lungs 2 (two) times daily. 07/13/13  Yes Donita Brooks, NP  rivaroxaban (XARELTO) 20 MG TABS tablet Take 1 tablet (20 mg total) by mouth daily with supper. 08/02/13  Yes Donita Brooks, NP   BP 140/101  Pulse 125  Resp 25  Ht 6\' 1"  (1.854 m)  Wt 170 lb (77.111 kg)  BMI 22.43 kg/m2  SpO2 92% Physical Exam 1500: Physical examination:  Nursing notes reviewed; Vital signs and O2 SAT reviewed;  Constitutional: Well developed, Well nourished, Uncomfortable appearing.; Head:  Normocephalic, atraumatic; Eyes: EOMI, PERRL, No scleral icterus; ENMT: Mouth and pharynx normal, Mucous membranes dry; Neck: Supple, Full range of motion, No lymphadenopathy; Cardiovascular: Tachycardic rate and rhythm, No gallop; Respiratory: Breath sounds diminished & equal bilaterally, scattered insp/exp wheezes. Speaking in long phrases. Sitting upright.Tachypneic and intermittently hyperventilating.; Chest: Nontender, Movement normal; Abdomen: Soft, Nontender, Nondistended, Normal bowel sounds; Genitourinary: No CVA tenderness; Extremities: Pulses normal, No tenderness, No edema, No calf edema or asymmetry.; Neuro: AA&Ox3, Major CN grossly intact.  Speech clear. No gross focal motor or sensory deficits in extremities.; Skin: Color normal, Warm, Dry.   ED Course  Procedures     EKG Interpretation   Date/Time:  Sunday August 16 2013 14:57:12 EDT Ventricular Rate:  123 PR  Interval:  139 QRS Duration: 83 QT Interval:  291 QTC Calculation: 416 R Axis:   -65 Text Interpretation:  Sinus tachycardia Left axis deviation No significant  change since last tracing Confirmed by [facility]  MD, Jenny Reichmann ([id number]) on  08/16/2013 3:06:15 PM      MDM  MDM Reviewed: previous chart, nursing note and vitals Reviewed previous: labs and ECG Interpretation: labs, ECG and x-ray Total time providing critical care: 30-74 minutes. This excludes time spent performing separately reportable procedures and services. Consults: admitting MD    CRITICAL CARE Performed by: Alfonzo Feller Total critical care time: 40 Critical care time was exclusive of separately billable procedures and treating other patients. Critical care was necessary to treat or prevent imminent or life-threatening deterioration. Critical care was time spent personally by me on the following activities: development of treatment plan with patient and/or surrogate as well as nursing, discussions with consultants, evaluation of patient's response to treatment, examination of patient, obtaining history from patient or surrogate, ordering and performing treatments and interventions, ordering and review of laboratory studies, ordering and review of radiographic studies,  pulse oximetry and re-evaluation of patient's condition.   Results for orders placed during the hospital encounter of 08/16/13  CBC      Result Value Ref Range   WBC 16.7 (*) 4.0 - 10.5 K/uL   RBC 5.69  4.22 - 5.81 MIL/uL   Hemoglobin 16.9  13.0 - 17.0 g/dL   HCT 51.1  39.0 - 52.0 %   MCV 89.8  78.0 - 100.0 fL   MCH 29.7  26.0 - 34.0 pg   MCHC 33.1  30.0 - 36.0 g/dL   RDW 15.2  11.5 - 15.5 %   Platelets 159  150 - 400 K/uL  BASIC METABOLIC PANEL      Result Value Ref Range   Sodium 138  137 - 147 mEq/L   Potassium 5.0  3.7 - 5.3 mEq/L   Chloride 96  96 - 112 mEq/L   CO2 25  19 - 32 mEq/L   Glucose, Bld 160 (*) 70 - 99 mg/dL   BUN 17  6 - 23  mg/dL   Creatinine, Ser 1.29  0.50 - 1.35 mg/dL   Calcium 8.9  8.4 - 10.5 mg/dL   GFR calc non Af Amer 55 (*) >90 mL/min   GFR calc Af Amer 64 (*) >90 mL/min   Anion gap 17 (*) 5 - 15  PRO B NATRIURETIC PEPTIDE      Result Value Ref Range   Pro B Natriuretic peptide (BNP) 230.8 (*) 0 - 125 pg/mL  TROPONIN I      Result Value Ref Range   Troponin I <0.30  <0.30 ng/mL   Ct Angio Chest Pe W/cm &/or Wo Cm 08/16/2013   CLINICAL DATA:  Shortness of breath.  EXAM: CT ANGIOGRAPHY CHEST WITH CONTRAST  TECHNIQUE: Multidetector CT imaging of the chest was performed using the standard protocol during bolus administration of intravenous contrast. Multiplanar CT image reconstructions and MIPs were obtained to evaluate the vascular anatomy.  CONTRAST:  127mL OMNIPAQUE IOHEXOL 350 MG/ML SOLN  COMPARISON:  08/05/2013  FINDINGS: Small amount of residual eccentric chronic what again seen within the main right pulmonary artery and proximal right lower lobe pulmonary artery, not significantly changed. No new pulmonary embolus.  Increasing right pleural effusion, now moderate. Continued volume loss and air bronchograms within the right middle lobe, stable. Increasing airspace disease in the right lower lobe. Moderate emphysematous changes. Minimal scattered opacities noted in the left lower lobe at the left lung base and in the lingula.  Small nodule in the lingula measures 5 mm on image 60, stable. Adenopathy in the mediastinum and hila again noted, unchanged. AP window lymph node has a short axis diameter of 16 mm on image 37. Left hilar lymph node has a short axis diameter of 14 mm on image 41. Left hilar lymph node on image 53 as a short axis diameter of 19 mm. Other enlarged AP window, left hilar and paratracheal lymph nodes are also stable.  Heart is borderline in size. Coronary artery calcifications. Aorta is normal caliber.  Imaging into the upper abdomen again demonstrates multiple low-density lesions throughout the  liver compatible with metastases, likely not significantly changed. Sclerotic foci throughout the thoracic spine are stable, likely bone metastases.  Review of the MIP images confirms the above findings.  IMPRESSION: Stable chronic small pulmonary embolus in the right main and lower lobe pulmonary arteries. This is nonocclusive. No new pulmonary embolus.  Enlarging right pleural effusion with increasing right lower lobe consolidation. Stable volume loss  and air bronchograms in the medial right middle lobe.  Stable mediastinal and left hilar adenopathy compatible with metastatic disease. Stable multiple ill-defined hepatic metastases. Stable sclerotic bone metastases.   Electronically Signed   By: Rolm Baptise M.D.   On: 08/16/2013 18:26    Dg Chest Port 1 View 08/16/2013   CLINICAL DATA:  SHORTNESS OF BREATH  EXAM: PORTABLE CHEST - 1 VIEW  COMPARISON:  Portable chest radiograph 08/05/2013  FINDINGS: Visualized cardiac silhouette stable. Persistent right lower lobe opacity with increased conspicuity silhouetting the right heart border. There is prominence of interstitial markings. Stable fullness in the left hilar region. No new focal regions of consolidation or new focal infiltrates. Osteoarthritic changes in the shoulders. No acute osseous abnormalities.  IMPRESSION: Stable pulmonary opacity right lower lobe. Differential considerations again include pneumonia, pulmonary hemorrhage, component of atelectasis.  Chest radiograph otherwise stable.   Electronically Signed   By: Margaree Mackintosh M.D.   On: 08/16/2013 15:35    1845:  On arrival, pt sitting upright tachypneic, tachycardic, wheezing: hour long neb and IV solumedrol given. After neb, pt leaning back on stretcher, speaking full sentences, lungs coarse with faint wheezing. Pt continued tachycardic. CT-A chest without PE, but with continued RLL consolidation and right pleural effusion. IV abx for HCAP started. Pt noted to be febrile, APAP given with  improvement. Dx and testing d/w pt and family.  Questions answered.  Verb understanding, agreeable to admit.  T/C to [facility] Dr. Humphrey Rolls, case discussed, including:  HPI, pertinent PM/SHx, VS/PE, dx testing, ED course and treatment:  Agreeable to admit, requests he will come to the ED for evaluation.    Alfonzo Feller, DO 08/18/13 1226

## 2013-08-16 NOTE — ED Notes (Signed)
Per EMS - pt d/c from here on 08/06/13 dx with PE and PNA - reports worsening sob and no improvements starting today.  Pt labored upon assessment.  C/o pain to right chest and ribs.

## 2013-08-16 NOTE — ED Notes (Signed)
MD at bedside. 

## 2013-08-17 ENCOUNTER — Inpatient Hospital Stay (HOSPITAL_COMMUNITY): Payer: Medicare Other

## 2013-08-17 ENCOUNTER — Encounter (HOSPITAL_COMMUNITY): Payer: Self-pay | Admitting: Radiology

## 2013-08-17 DIAGNOSIS — J90 Pleural effusion, not elsewhere classified: Secondary | ICD-10-CM

## 2013-08-17 DIAGNOSIS — A419 Sepsis, unspecified organism: Principal | ICD-10-CM

## 2013-08-17 DIAGNOSIS — C61 Malignant neoplasm of prostate: Secondary | ICD-10-CM

## 2013-08-17 DIAGNOSIS — J9600 Acute respiratory failure, unspecified whether with hypoxia or hypercapnia: Secondary | ICD-10-CM

## 2013-08-17 DIAGNOSIS — D72829 Elevated white blood cell count, unspecified: Secondary | ICD-10-CM

## 2013-08-17 DIAGNOSIS — R911 Solitary pulmonary nodule: Secondary | ICD-10-CM

## 2013-08-17 DIAGNOSIS — I2699 Other pulmonary embolism without acute cor pulmonale: Secondary | ICD-10-CM

## 2013-08-17 DIAGNOSIS — J441 Chronic obstructive pulmonary disease with (acute) exacerbation: Secondary | ICD-10-CM

## 2013-08-17 LAB — EXPECTORATED SPUTUM ASSESSMENT W REFEX TO RESP CULTURE

## 2013-08-17 LAB — CBC
HCT: 44.1 % (ref 39.0–52.0)
Hemoglobin: 14.8 g/dL (ref 13.0–17.0)
MCH: 29.8 pg (ref 26.0–34.0)
MCHC: 33.6 g/dL (ref 30.0–36.0)
MCV: 88.7 fL (ref 78.0–100.0)
PLATELETS: 133 10*3/uL — AB (ref 150–400)
RBC: 4.97 MIL/uL (ref 4.22–5.81)
RDW: 15 % (ref 11.5–15.5)
WBC: 21.5 10*3/uL — ABNORMAL HIGH (ref 4.0–10.5)

## 2013-08-17 LAB — COMPREHENSIVE METABOLIC PANEL
ALBUMIN: 2.1 g/dL — AB (ref 3.5–5.2)
ALT: 37 U/L (ref 0–53)
AST: 30 U/L (ref 0–37)
Alkaline Phosphatase: 303 U/L — ABNORMAL HIGH (ref 39–117)
Anion gap: 15 (ref 5–15)
BILIRUBIN TOTAL: 0.7 mg/dL (ref 0.3–1.2)
BUN: 26 mg/dL — ABNORMAL HIGH (ref 6–23)
CHLORIDE: 101 meq/L (ref 96–112)
CO2: 23 mEq/L (ref 19–32)
Calcium: 8.6 mg/dL (ref 8.4–10.5)
Creatinine, Ser: 1.19 mg/dL (ref 0.50–1.35)
GFR calc Af Amer: 70 mL/min — ABNORMAL LOW (ref >90)
GFR calc non Af Amer: 61 mL/min — ABNORMAL LOW (ref >90)
Glucose, Bld: 183 mg/dL — ABNORMAL HIGH (ref 70–99)
Potassium: 5 mEq/L (ref 3.7–5.3)
SODIUM: 139 meq/L (ref 137–147)
Total Protein: 6.6 g/dL (ref 6.0–8.3)

## 2013-08-17 LAB — STREP PNEUMONIAE URINARY ANTIGEN: Strep Pneumo Urinary Antigen: NEGATIVE

## 2013-08-17 LAB — EXPECTORATED SPUTUM ASSESSMENT W GRAM STAIN, RFLX TO RESP C

## 2013-08-17 LAB — LACTATE DEHYDROGENASE: LDH: 361 U/L — AB (ref 94–250)

## 2013-08-17 LAB — PROTIME-INR
INR: 2.02 — ABNORMAL HIGH (ref 0.00–1.49)
Prothrombin Time: 22.9 seconds — ABNORMAL HIGH (ref 11.6–15.2)

## 2013-08-17 LAB — HIV ANTIBODY (ROUTINE TESTING W REFLEX): HIV 1&2 Ab, 4th Generation: NONREACTIVE

## 2013-08-17 LAB — TSH: TSH: 0.397 u[IU]/mL (ref 0.350–4.500)

## 2013-08-17 MED ORDER — VANCOMYCIN HCL IN DEXTROSE 750-5 MG/150ML-% IV SOLN
INTRAVENOUS | Status: AC
  Filled 2013-08-17: qty 150

## 2013-08-17 MED ORDER — IOHEXOL 300 MG/ML  SOLN
75.0000 mL | Freq: Once | INTRAMUSCULAR | Status: AC | PRN
  Administered 2013-08-17: 75 mL via INTRAVENOUS

## 2013-08-17 NOTE — Progress Notes (Signed)
Attempted to arrange for set up for the CT biopsy of his liver.  The [facility] nurse stated that everyone that could schedule the test was already gone for today and I should try to schedule the appointment in the morning.  I called [facility] and [facility] interventional radiology. I was unable to schedule with either facility.  I will pass on to the oncoming nurse and place a sticky note. Sharen Hones, Rn

## 2013-08-17 NOTE — Consult Note (Addendum)
[facility] Consultation Oncology  Name: Jonathan Goodwin      MRN: [id number]    Location: A301/A301-01  Date: 08/17/2013 Time:2:33 PM   REFERRING PHYSICIAN:  Hartley, DO  REASON FOR CONSULT:  Liver/lung mets?   DIAGNOSIS:  Presumed metastatic prostate cancer, needing biopsy  HISTORY OF PRESENT ILLNESS:   Jonathan Goodwin (pronounced man) is a 68 year old white man who has a past medical history significant for HCAP, COPD, PE (on Xarelto), and prostate cancer diagnosed in 2009 by Dr. Maryland Pink and treated with radiation at [facility] by Dr. Sondra Come.  The patient reports that he has been in the hospital a number of times for pneumonia which has been treated with IV antibiotics.  He then is discharged home with PO antibiotics and fails therapy requiring re-hospitalization.    He reports that he presented to the [facility] ED with right sided pain.  Chart is reviewed.   I personally reviewed and went over laboratory results with the patient.  The results are noted within this dictation.  PSA on 08/09/2013 was 0.27.  He reports that he sees Dr. Michela Pitcher every 3 months for PSA checks and he notes that the PSA levels have been adequate.  He remembers his PSA being 64 at time of diagnosis of Prostate Cancer.   I personally reviewed and went over radiographic studies with the patient.  The results are noted within this dictation.  I have reviewed his imaging tests that reveal the following:  1. Stable chronic small pulmonary embolus in the right main and lower  lobe pulmonary arteries. This is nonocclusive. No new pulmonary  embolus. 2. Enlarging right pleural effusion with increasing right lower lobe  consolidation 3. Stable mediastinal and left hilar adenopathy compatible with  metastatic disease. Stable multiple ill-defined hepatic metastases.  Stable sclerotic bone metastases. 4. Hepatic, nodal, and osseous metastatic disease.   Following his last hospitalization,  Dr. Sarajane Jews (hospitalist) arranged for an outpatient liver lesion biopsy with the assistance of the patient's primary care provider, Burman Freestone, NP.  That was scheduled for 7/8.  This afternoon, the patient declines any complaints and oncologic ROS questioning is negative.   "Can I have a regular diet so I can eat salt?"   PAST MEDICAL HISTORY:   Past Medical History  Diagnosis Date  . H/O renal calculi   . Urethral stricture   . Wears hearing aid     right ear  . Hearing impaired person, right   . Arthritis   . Macular degeneration   . Ulcerative colitis   . History of kidney stones   . Pulmonary embolus 07/10/2013  . DVT, lower extremity 07/10/2013    LLE  . COPD (chronic obstructive pulmonary disease)   . Prostate cancer 10/24/07    radiation therapy  . Metastatic disease     unknown source, found on CT A/P 08/08/2013    ALLERGIES: No Known Allergies    MEDICATIONS: I have reviewed the patient's current medications.     PAST SURGICAL HISTORY Past Surgical History  Procedure Laterality Date  . Knee arthroscopy  1993    right  . Appendectomy  2010    [facility], Dr. Romona Curls  . Shoulder surgery      left, for chronic dislocation  . Cystoscopy  2010    [facility], Dr. Maryland Pink  . Urethrotomy  06/12/2011    Procedure: CYSTOSCOPY/URETHROTOMY;  Surgeon: Marissa Nestle, MD;  Location: [facility];  Service: Urology;  Laterality: N/A;  optical urethrotomy  . Cataract extraction w/phaco Left 01/15/2013    Procedure: CATARACT EXTRACTION PHACO AND INTRAOCULAR LENS PLACEMENT (IOC);  Surgeon: Tonny Branch, MD;  Location: [facility];  Service: Ophthalmology;  Laterality: Left;  CDE:22.42  . Cystoscopy with urethral dilatation N/A 06/30/2013    Procedure: CYSTOSCOPY WITH URETHRAL DILATATION;  Surgeon: Marissa Nestle, MD;  Location: [facility];  Service: Urology;  Laterality: N/A;  . Rectal exam under anesthesia N/A 06/30/2013    Procedure: RECTAL EXAM UNDER ANESTHESIA;  Surgeon: Marissa Nestle, MD;   Location: [facility];  Service: Urology;  Laterality: N/A;  . Urethrotomy N/A 06/30/2013    Procedure: CYSTOSCOPY/URETHROTOMY;  Surgeon: Marissa Nestle, MD;  Location: [facility];  Service: Urology;  Laterality: N/A;  . Cystoscopy 2015      FAMILY HISTORY: Family History  Problem Relation Age of Onset  . Cancer Mother     colon  . Cancer Paternal Grandmother     colon    SOCIAL HISTORY:  reports that he quit smoking about 5 weeks ago. His smoking use included Cigarettes. He has a 58 pack-year smoking history. He has quit using smokeless tobacco. He reports that he drinks alcohol. He reports that he does not use illicit drugs.  PERFORMANCE STATUS: The patient's performance status is 1 - Symptomatic but completely ambulatory  PHYSICAL EXAM: Most Recent Vital Signs: Blood pressure 108/73, pulse 65, temperature 97.6 F (36.4 C), temperature source Oral, resp. rate 18, height $RemoveBe'6\' 1"'[id number]$  (1.854 m), weight 170 lb (77.111 kg), SpO2 93.00%. General appearance: alert, cooperative, appears older than stated age, mild distress and [place] in place, odd affect Head: Normocephalic, without obvious abnormality, atraumatic Eyes: negative findings: lids and lashes normal, conjunctivae and sclerae normal and corneas clear Throat: normal findings: lips normal without lesions, buccal mucosa normal, tongue midline and normal and soft palate, uvula, and tonsils normal Neck: supple, symmetrical, trachea midline Lungs: diminished breath sounds bilaterally Heart: regular rate and rhythm Abdomen: soft, non-tender; bowel sounds normal; no masses,  no organomegaly Skin: Skin color, texture, turgor normal. No rashes or lesions Neurologic: Grossly normal  LABORATORY DATA:  Results for orders placed during the hospital encounter of 08/16/13 (from the past 48 hour(s))  CBC     Status: Abnormal   Collection Time    08/16/13  3:09 PM      Result Value Ref Range   WBC 16.7 (*) 4.0 - 10.5 K/uL   RBC 5.69  4.22 - 5.81 MIL/uL    Hemoglobin 16.9  13.0 - 17.0 g/dL   HCT 51.1  39.0 - 52.0 %   MCV 89.8  78.0 - 100.0 fL   MCH 29.7  26.0 - 34.0 pg   MCHC 33.1  30.0 - 36.0 g/dL   RDW 15.2  11.5 - 15.5 %   Platelets 159  150 - 400 K/uL  BASIC METABOLIC PANEL     Status: Abnormal   Collection Time    08/16/13  3:09 PM      Result Value Ref Range   Sodium 138  137 - 147 mEq/L   Potassium 5.0  3.7 - 5.3 mEq/L   Chloride 96  96 - 112 mEq/L   CO2 25  19 - 32 mEq/L   Glucose, Bld 160 (*) 70 - 99 mg/dL   BUN 17  6 - 23 mg/dL   Creatinine, Ser 1.29  0.50 - 1.35 mg/dL   Calcium 8.9  8.4 - 10.5 mg/dL   GFR  calc non Af Amer 55 (*) >90 mL/min   GFR calc Af Amer 64 (*) >90 mL/min   Comment: (NOTE)     The eGFR has been calculated using the CKD EPI equation.     This calculation has not been validated in all clinical situations.     eGFR's persistently <90 mL/min signify possible Chronic Kidney     Disease.   Anion gap 17 (*) 5 - 15  PRO B NATRIURETIC PEPTIDE     Status: Abnormal   Collection Time    08/16/13  3:09 PM      Result Value Ref Range   Pro B Natriuretic peptide (BNP) 230.8 (*) 0 - 125 pg/mL  TROPONIN I     Status: None   Collection Time    08/16/13  3:09 PM      Result Value Ref Range   Troponin I <0.30  <0.30 ng/mL   Comment:            Due to the release kinetics of cTnI,     a negative result within the first hours     of the onset of symptoms does not rule out     myocardial infarction with certainty.     If myocardial infarction is still suspected,     repeat the test at appropriate intervals.  HIV ANTIBODY (ROUTINE TESTING)     Status: None   Collection Time    08/16/13  9:19 PM      Result Value Ref Range   HIV 1&2 Ab, 4th Generation NONREACTIVE  NONREACTIVE   Comment: (NOTE)     A NONREACTIVE HIV Ag/Ab result does not exclude HIV infection since     the time frame for seroconversion is variable. If acute HIV infection     is suspected, a HIV-1 RNA Qualitative TMA test is recommended.      HIV-1/2 Antibody Diff         Not indicated.     HIV-1 RNA, Qual TMA           Not indicated.     PLEASE NOTE: This information has been disclosed to you from records     whose confidentiality may be protected by state law. If your state     requires such protection, then the state law prohibits you from making     any further disclosure of the information without the specific written     consent of the person to whom it pertains, or as otherwise permitted     by law. A general authorization for the release of medical or other     information is NOT sufficient for this purpose.     The performance of this assay has not been clinically validated in     patients less than 48 years old.     Performed at [facility]  TSH     Status: None   Collection Time    08/16/13  9:19 PM      Result Value Ref Range   TSH 0.397  0.350 - 4.500 uIU/mL   Comment: Performed at [facility]     Status: None   Collection Time    08/17/13 12:26 AM      Result Value Ref Range   Strep Pneumo Urinary Antigen NEGATIVE  NEGATIVE   Comment: PERFORMED AT [facility]     Performed at [place]  Status: Abnormal   Collection Time    08/17/13  5:17 AM      Result Value Ref Range   WBC 21.5 (*) 4.0 - 10.5 K/uL   RBC 4.97  4.22 - 5.81 MIL/uL   Hemoglobin 14.8  13.0 - 17.0 g/dL   HCT 44.1  39.0 - 52.0 %   MCV 88.7  78.0 - 100.0 fL   MCH 29.8  26.0 - 34.0 pg   MCHC 33.6  30.0 - 36.0 g/dL   RDW 15.0  11.5 - 15.5 %   Platelets 133 (*) 150 - 400 K/uL  COMPREHENSIVE METABOLIC PANEL     Status: Abnormal   Collection Time    08/17/13  5:17 AM      Result Value Ref Range   Sodium 139  137 - 147 mEq/L   Potassium 5.0  3.7 - 5.3 mEq/L   Chloride 101  96 - 112 mEq/L   CO2 23  19 - 32 mEq/L   Glucose, Bld 183 (*) 70 - 99 mg/dL   BUN 26 (*) 6 - 23 mg/dL   Creatinine, Ser 1.19  0.50 - 1.35 mg/dL   Calcium 8.6  8.4 - 10.5 mg/dL   Total Protein  6.6  6.0 - 8.3 g/dL   Albumin 2.1 (*) 3.5 - 5.2 g/dL   AST 30  0 - 37 U/L   ALT 37  0 - 53 U/L   Alkaline Phosphatase 303 (*) 39 - 117 U/L   Total Bilirubin 0.7  0.3 - 1.2 mg/dL   GFR calc non Af Amer 61 (*) >90 mL/min   GFR calc Af Amer 70 (*) >90 mL/min   Comment: (NOTE)     The eGFR has been calculated using the CKD EPI equation.     This calculation has not been validated in all clinical situations.     eGFR's persistently <90 mL/min signify possible Chronic Kidney     Disease.   Anion gap 15  5 - 15  LACTATE DEHYDROGENASE     Status: Abnormal   Collection Time    08/17/13  7:32 AM      Result Value Ref Range   LDH 361 (*) 94 - 250 U/L  PROTIME-INR     Status: Abnormal   Collection Time    08/17/13 12:38 PM      Result Value Ref Range   Prothrombin Time 22.9 (*) 11.6 - 15.2 seconds   INR 2.02 (*) 0.00 - 1.49      RADIOGRAPHY: Ct Angio Chest Pe W/cm &/or Wo Cm  08/16/2013   CLINICAL DATA:  Shortness of breath.  EXAM: CT ANGIOGRAPHY CHEST WITH CONTRAST  TECHNIQUE: Multidetector CT imaging of the chest was performed using the standard protocol during bolus administration of intravenous contrast. Multiplanar CT image reconstructions and MIPs were obtained to evaluate the vascular anatomy.  CONTRAST:  148mL OMNIPAQUE IOHEXOL 350 MG/ML SOLN  COMPARISON:  08/05/2013  FINDINGS: Small amount of residual eccentric chronic what again seen within the main right pulmonary artery and proximal right lower lobe pulmonary artery, not significantly changed. No new pulmonary embolus.  Increasing right pleural effusion, now moderate. Continued volume loss and air bronchograms within the right middle lobe, stable. Increasing airspace disease in the right lower lobe. Moderate emphysematous changes. Minimal scattered opacities noted in the left lower lobe at the left lung base and in the lingula.  Small nodule in the lingula measures 5 mm on image 60, stable. Adenopathy in the mediastinum  and hila again  noted, unchanged. AP window lymph node has a short axis diameter of 16 mm on image 37. Left hilar lymph node has a short axis diameter of 14 mm on image 41. Left hilar lymph node on image 53 as a short axis diameter of 19 mm. Other enlarged AP window, left hilar and paratracheal lymph nodes are also stable.  Heart is borderline in size. Coronary artery calcifications. Aorta is normal caliber.  Imaging into the upper abdomen again demonstrates multiple low-density lesions throughout the liver compatible with metastases, likely not significantly changed. Sclerotic foci throughout the thoracic spine are stable, likely bone metastases.  Review of the MIP images confirms the above findings.  IMPRESSION: Stable chronic small pulmonary embolus in the right main and lower lobe pulmonary arteries. This is nonocclusive. No new pulmonary embolus.  Enlarging right pleural effusion with increasing right lower lobe consolidation. Stable volume loss and air bronchograms in the medial right middle lobe.  Stable mediastinal and left hilar adenopathy compatible with metastatic disease. Stable multiple ill-defined hepatic metastases. Stable sclerotic bone metastases.   Electronically Signed   By: Rolm Baptise M.D.   On: 08/16/2013 18:26   Dg Chest Port 1 View  08/16/2013   CLINICAL DATA:  SHORTNESS OF BREATH  EXAM: PORTABLE CHEST - 1 VIEW  COMPARISON:  Portable chest radiograph 08/05/2013  FINDINGS: Visualized cardiac silhouette stable. Persistent right lower lobe opacity with increased conspicuity silhouetting the right heart border. There is prominence of interstitial markings. Stable fullness in the left hilar region. No new focal regions of consolidation or new focal infiltrates. Osteoarthritic changes in the shoulders. No acute osseous abnormalities.  IMPRESSION: Stable pulmonary opacity right lower lobe. Differential considerations again include pneumonia, pulmonary hemorrhage, component of atelectasis.  Chest radiograph  otherwise stable.   Electronically Signed   By: Margaree Mackintosh M.D.   On: 08/16/2013 15:35       PATHOLOGY:  None   ASSESSMENT:  1. 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  Liver lesion biopsy pending. 2. Pleural effusion, thoracentesis tomorrow. 3. PE, improving on recent CT angio of chest, on Xarelto.  Xarelto on hold for thoracentesis tomorrow. 4. H/O prostate cancer in 2009, managed by Dr. [place] Pink and treated with radiation by Dr. Sondra Come at [facility], no systemic therapy ever administered. 5. COPD 6. Odd affect, will perform CT of head.   Patient Active Problem List   Diagnosis Date Noted  . Pneumonia 08/16/2013  . Acute respiratory failure with hypoxia 08/06/2013  . Leukocytosis 08/05/2013  . Coagulopathy 08/05/2013  . Hypoxemia 08/05/2013  . Lung nodule 07/24/2013  . Acute renal failure 07/24/2013  . HCAP (healthcare-associated pneumonia) 07/24/2013  . COPD (chronic obstructive pulmonary disease)-PFT pending  07/24/2013  . Pulmonary embolism 07/10/2013  . PE (pulmonary embolism) 07/10/2013  . Prostate cancer 10/24/2007     PLAN:  1. I personally reviewed and went over laboratory results with the patient.  The results are noted within this dictation. 2. I personally reviewed and went over radiographic studies with the patient.  The results are noted within this dictation.   3. Chart reviewed 4. Labs today: testosterone level 5. Thoracentesis as planned.  Recommend fluid be sent for cytology 6. Recommend US-guided biopsy of liver lesion at time of thoracentesis to limit time off of anticoagulation. Tissue for [facility] analysis as well 7. CT head w and wo contrast to evaluate for metastatic disease.  Patient has an odd affect and it is important to  evaluate for metastatic disease now as a baseline. Although brain metastases from prostate cancer is rare, poorly differentiated prostate cancer, particularly small cell type or very poorly  differentiated adenocarcinoma,may in fact involve the brain ultimately. 8. Will follow patient along while an inpatient.  Will re-visit patient following biopsy and pathology results to develop treatment choices and discuss future options.   All questions were answered. The patient knows to call the clinic with any problems, questions or concerns. We can certainly see the patient much sooner if necessary.  Patient and plan discussed with Dr. Farrel Gobble and he is in agreement with the aforementioned.   KEFALAS,THOMAS 08/17/2013

## 2013-08-17 NOTE — Care Management Note (Addendum)
    Page 1 of 2   08/25/2013     2:58:20 PM CARE MANAGEMENT NOTE 08/25/2013  Patient:  Jonathan Goodwin, Jonathan Goodwin   Account Number:  [id number]  Date Initiated:  08/17/2013  Documentation initiated by:  Theophilus Kinds  Subjective/Objective Assessment:   Pt admitted from home with pneumonia. Pt lives with his wife and will return home at discharge. Pt is fairly independent with ADL's.     Action/Plan:   Pt may need HH at discharge. Will also assess need for home O2 prior to discharge. Will continue to monitor.   Anticipated DC Date:  08/21/2013   Anticipated DC Plan:  [place] [facility]  CM consult      [place]   Choice offered to / List presented to:  C-1 Patient   DME arranged  [place] B[facility]      DME agency  [place] arranged  HH-1 RN  [place].   Status of service:  Completed, signed off Medicare Important Message given?  YES (If response is "NO", the following Medicare IM given date fields will be blank) Date Medicare IM given:  08/21/2013 Medicare IM given by:  Christinia Gully C Date Additional Medicare IM given:  08/24/2013 Additional Medicare IM given by:  Theophilus Kinds  Discharge Disposition:  [place]  Per UR Regulation:    If discussed at Long Length of Stay Meetings, dates discussed:   08/25/2013    Comments:  08/25/13 1500 Christinia Gully, RN BSN CM Pt discharge held yesterday due to pt developing pleural effusion and requiring thoracentesis on 08/25/13. Pt for discharge today. All HH and DME arrangements in place. Pt and pts nurse aware of discharge arrangements.  08/24/13 [place], RN BSN CM Pt to be discharged home today after port inserted. HH arranged with [facility] RN and PT (per wife choice). Romualdo Bolk of [facility] is aware and will  collect the pts information from the chart. [place] services to start within 48 hours of discharge. Pt will d/c home on po AB. Hospital bed, rolling walker  and home O2 arranged with [facility]. Emma to deliver walker and  portable to pts room prior to discharge. Bed will be delivered to pts home. Pt and pts nurse aware of discharge arrangements.   08/21/13 [place], RN BSN CM Pt potential discharge over the weekend. Pt may need IV AB at discharge. Pt has agreed to [facility] RN and PT with [facility] (per pts choice). Emma with [facility] is aware and will collect pts information from the chart. Pt to also be assessed for need for home O2 prior to discharge. Weekend staff to arrange [facility] and DME with [facility] once orders written.  08/20/13 [place], RN BSN CM CM spoke with pt and his wife about discharge arrangements and possible need for IV AB at discharge. Pts wife feels comfortable learning how to do IV AB if needed. THey chose [facility] for [facility] and DME needs. Will continue to follow for discharge planning needs.  08/17/13 [place], RN BSN CM

## 2013-08-17 NOTE — Progress Notes (Signed)
2 sat checked for assessment fr continuous oxygen at hme:  Resting without actvty was 86% room air.  4 LPM O2 added back and O2 sats increased to 92% via n/c.  Notified [place] in Case management and she stated she would notify Tammy Black RN CM.  The patient was left in stable condition.

## 2013-08-17 NOTE — Progress Notes (Signed)
[facility]                                                                              Patient Demographics  Jonathan Goodwin, is a 68 y.o. male, DOB - 15-Feb-1945, NFA:[id number]  Admit date - 08/16/2013   Admitting Physician Allyne Gee, MD  Outpatient Primary MD for the patient is [name], FNP  LOS - 1   Chief Complaint  Patient presents with  . Shortness of Breath      HPI: Jonathan Goodwin is a 68 y.o. male with recurring bouts of pneumonia presents to the ED after a recent discharge for the same. Patient sated that he has noted increased cough and shortness of breath. He stated he cannot lay down. He stated that whenever he comes in for IV antibiotics he appears to improve but when he is switched to oral treatment and sent home his symptoms seem to get worse. Patient stated that he has noted wheeze. He also stated that he has had prostate cancer and is being followed by urology. Recently he was noted to have disease in the liver and is actually scheduled for a biopsy. He stated that he is also to see oncology for the lung and chest mets noted on the scan.  Assessment & Plan   Sepsis secondary to recurrent HCAP -Upon admission, patient was febrile with leukocytosis and tachycardia -Treatment and plan as below  Dyspnea secondary to recurrent HCAP with pleural effusion -Patient recently discharged 6/28 for pneumonia -CT angio shows no PE, moderate right sided pleural effusion -Will continue vanc/aztreonam -Will consult IR for thoracentesis  -Will also consult pulmonology for recurrent HCAP  Pulmonary Embolism -On Xarelto -Will hold for thoracentesis  Prostate Cancer -being monitored and treated by urology  ?Metastatic Liver disease -Patient is supposed to have live biopsy -Will consult oncology  COPD -Continue symbicort  Nicotine abuse -Patient counseled  Code Status: Full  Family Communication: None at bedside  Disposition Plan: Admitted  Time  Spent in minutes   30 minutes  Procedures  None  Consults   Interventional radiology Pulmonology  Oncology  DVT Prophylaxis  SCDs  Lab Results  Component Value Date   PLT 133* 08/17/2013    Medications  Scheduled Meds: . antiseptic oral rinse  15 mL Mouth Rinse BID  . aztreonam  2 g Intravenous Q8H  . budesonide-formoterol  2 puff Inhalation BID  . folic acid  1 mg Oral Daily  . multivitamin with minerals  1 tablet Oral Daily  . rivaroxaban  20 mg Oral Q supper  . thiamine  100 mg Oral Daily  . vancomycin  750 mg Intravenous Q12H   Continuous Infusions: . sodium chloride 50 mL/hr at 08/16/13 2141   PRN Meds:.acetaminophen, acetaminophen, albuterol, alum & mag hydroxide-simeth, ondansetron (ZOFRAN) IV, ondansetron, oxyCODONE  Antibiotics    Anti-infectives   Start     Dose/Rate Route Frequency Ordered Stop   08/17/13 0600  vancomycin (VANCOCIN) IVPB 750 mg/150 ml premix     750 mg 150 mL/hr over 60 Minutes Intravenous Every 12 hours 08/16/13 2114     08/16/13 2100  aztreonam (AZACTAM) 2 g in dextrose 5 % 50  mL IVPB     2 g 100 mL/hr over 30 Minutes Intravenous Every 8 hours 08/16/13 2000 08/24/13 2059   08/16/13 1730  vancomycin (VANCOCIN) IVPB 1000 mg/200 mL premix     1,000 mg 200 mL/hr over 60 Minutes Intravenous  Once 08/16/13 1720 08/16/13 1907   08/16/13 1715  ceFEPIme (MAXIPIME) 1 g in dextrose 5 % 50 mL IVPB     1 g 100 mL/hr over 30 Minutes Intravenous  Once 08/16/13 1700 08/16/13 1800      Subjective:   Matt Holmes seen and examined today.  Patient currently anxious about his situation. He complains of shortness of breath. He is upset that he continues to have pneumonia. He states that he would be on IV antibiotics in the hospital, however when he switched to antibiotics by mouth and sent home, his pneumonia continues that he worsens and he has to come back to the hospital. At this point, patient denies any chest pain, abdominal pain, nausea,  vomiting.  Objective:   Filed Vitals:   08/16/13 1900 08/16/13 2334 08/17/13 0624 08/17/13 0740  BP: 101/73  108/73   Pulse: 111  65   Temp:   97.6 F (36.4 C)   TempSrc:   Oral   Resp: 31  18   Height:      Weight:      SpO2: 93% 90% 91% 93%    Wt Readings from Last 3 Encounters:  08/16/13 77.111 kg (170 lb)  08/05/13 90.719 kg (200 lb)  07/24/13 91.173 kg (201 lb)     Intake/Output Summary (Last 24 hours) at 08/17/13 1204 Last data filed at 08/17/13 0904  Gross per 24 hour  Intake 944.17 ml  Output    900 ml  Net  44.17 ml    Exam  General: Well developed, well nourished, NAD, appears stated age  HEENT: NCAT, PERRLA, EOMI, Anicteic Sclera, mucous membranes moist.   Neck: Supple, no JVD, no masses  Cardiovascular: S1 S2 auscultated, no rubs, murmurs or gallops. Regular rate and rhythm.  Respiratory: Clear to auscultation bilaterally with equal chest rise  Abdomen: Soft, nontender, mildly distended, + bowel sounds  Extremities: warm dry without cyanosis clubbing or edema  Neuro: AAOx3, cranial nerves grossly intact. Strength equal and bilateral in upper and lower ext  Skin: Without rashes exudates or nodules  Psych: Normal affect and demeanor with intact judgement and insight  Data Review   Micro Results No results found for this or any previous visit (from the past 240 hour(s)).  Radiology Reports Dg Chest 2 View  07/24/2013   CLINICAL DATA:  Pneumonia  EXAM: CHEST  2 VIEW  COMPARISON:  July 21, 2013 chest radiograph and Jul 10, 2013 chest CT angiogram  FINDINGS: There is underlying emphysematous change. Extensive interstitial and patchy alveolar infiltrate remain in the right mid and lower lung zones. The left lung appears clear. The heart size is normal. Pulmonary vascularity reflects underlying emphysematous change. No adenopathy.  IMPRESSION: Extensive infiltrate on the right, stable from most recent chest radiograph. Underlying emphysema. No new  opacity. It is difficult to ascertain how much of the infiltrate on the right is secondary to the recent pulmonary emboli versus infection. Both entities may exist concurrently.   Electronically Signed   By: Lowella Grip M.D.   On: 07/24/2013 15:32   Ct Angio Chest Pe W/cm &/or Wo Cm  08/16/2013   CLINICAL DATA:  Shortness of breath.  EXAM: CT ANGIOGRAPHY CHEST WITH CONTRAST  TECHNIQUE: Multidetector CT imaging of the chest was performed using the standard protocol during bolus administration of intravenous contrast. Multiplanar CT image reconstructions and MIPs were obtained to evaluate the vascular anatomy.  CONTRAST:  139mL OMNIPAQUE IOHEXOL 350 MG/ML SOLN  COMPARISON:  08/05/2013  FINDINGS: Small amount of residual eccentric chronic what again seen within the main right pulmonary artery and proximal right lower lobe pulmonary artery, not significantly changed. No new pulmonary embolus.  Increasing right pleural effusion, now moderate. Continued volume loss and air bronchograms within the right middle lobe, stable. Increasing airspace disease in the right lower lobe. Moderate emphysematous changes. Minimal scattered opacities noted in the left lower lobe at the left lung base and in the lingula.  Small nodule in the lingula measures 5 mm on image 60, stable. Adenopathy in the mediastinum and hila again noted, unchanged. AP window lymph node has a short axis diameter of 16 mm on image 37. Left hilar lymph node has a short axis diameter of 14 mm on image 41. Left hilar lymph node on image 53 as a short axis diameter of 19 mm. Other enlarged AP window, left hilar and paratracheal lymph nodes are also stable.  Heart is borderline in size. Coronary artery calcifications. Aorta is normal caliber.  Imaging into the upper abdomen again demonstrates multiple low-density lesions throughout the liver compatible with metastases, likely not significantly changed. Sclerotic foci throughout the thoracic spine are stable,  likely bone metastases.  Review of the MIP images confirms the above findings.  IMPRESSION: Stable chronic small pulmonary embolus in the right main and lower lobe pulmonary arteries. This is nonocclusive. No new pulmonary embolus.  Enlarging right pleural effusion with increasing right lower lobe consolidation. Stable volume loss and air bronchograms in the medial right middle lobe.  Stable mediastinal and left hilar adenopathy compatible with metastatic disease. Stable multiple ill-defined hepatic metastases. Stable sclerotic bone metastases.   Electronically Signed   By: Rolm Baptise M.D.   On: 08/16/2013 18:26   Ct Angio Chest W/cm &/or Wo Cm  08/05/2013   CLINICAL DATA:  Shortness of breath. History of kidney stents, previous pulmonary embolus, prostate cancer with radiation therapy.  EXAM: CT ANGIOGRAPHY CHEST WITH CONTRAST  TECHNIQUE: Multidetector CT imaging of the chest was performed using the standard protocol during bolus administration of intravenous contrast. Multiplanar CT image reconstructions and MIPs were obtained to evaluate the vascular anatomy.  CONTRAST:  13mL OMNIPAQUE IOHEXOL 350 MG/ML SOLN  COMPARISON:  07/10/2013  FINDINGS: Technically adequate study with moderately good opacification of the central and segmental pulmonary arteries. There is a small amount of residual eccentric thrombus demonstrated in the distal right main and proximal right lower lobe pulmonary arteries. This demonstrates improvement since prior study consistent with response to interval therapy. No new occlusive thrombus is demonstrated. Persistent increase in the right ventricular left ventricular ratio suggesting continued right heart strain.  Mild cardiac enlargement. Normal caliber thoracic aorta without dissection. Calcification of the aorta and Coronary arteries. Again demonstrated and similar to prior study, there is evidence of lymphadenopathy in the mediastinum and bilateral hilar regions. Since the prior  study, there is increasing consolidation and volume loss with air bronchograms in the right middle lung with increasing airspace disease throughout the right lower lung. Small right pleural effusion is developing since the prior study. Atelectasis or infiltration also present in the left lung base. Prominent centrilobular emphysematous changes are demonstrated in the upper lungs bilaterally. No pneumothorax.  Visualized portions of the upper  abdominal organs demonstrate multiple hypo enhancing nodules throughout the liver consistent with metastasis. Spleen size is normal. Left adrenal gland nodule measuring 21 mm diameter. Bilateral renal cysts. Abdominal findings are unchanged since prior study. Mild degenerative changes in the thoracic spine. Diffuse tip bone demineralization with small heterogeneous areas of sclerosis throughout. This is likely representing bony metastasis.  Review of the MIP images confirms the above findings.  IMPRESSION: 1. Overall decreasing clot burden in the pulmonary arteries since previous study with residual nonocclusive thrombus demonstrated on the right. Persistent changes suggesting right heart strain. 2. Increasing consolidation and airspace disease in the right lung could represent progressing pneumonia, nonspecific consolidated process, or possibly obstruction due to mass or extrinsic compression. 3. Persistent changes, likely metastatic, involving mediastinal and hilar lymph nodes, multiple liver masses, left adrenal gland nodule, and heterogeneous bone sclerosis.   Electronically Signed   By: Lucienne Capers M.D.   On: 08/05/2013 21:17   Ct Abdomen Pelvis W Contrast  08/08/2013   CLINICAL DATA:  Evaluate for metastatic disease. Liver and adrenal mass cyst  EXAM: CT ABDOMEN AND PELVIS WITH CONTRAST  TECHNIQUE: Multidetector CT imaging of the abdomen and pelvis was performed using the standard protocol following bolus administration of intravenous contrast.  CONTRAST:  86mL  OMNIPAQUE IOHEXOL 300 MG/ML SOLN, 148mL OMNIPAQUE IOHEXOL 300 MG/ML SOLN  COMPARISON:  09/03/2008  FINDINGS: BODY WALL: Unremarkable.  LOWER CHEST: Right lower and middle lobe airspace disease and interstitial thickening, recently evaluated by chest CT. There is a small right pleural effusion. Trace pericardial effusion. Coronary atherosclerosis.  ABDOMEN/PELVIS:  Liver: There are numerous hypo enhancing masses throughout the liver. The largest measuring 3.8 cm in the inferior right lobe.  Biliary: Cholelithiasis as seen previously. There is mild pericholecystic edema. No gallbladder distention to suggest biliary obstruction.  Pancreas: Unremarkable.  Spleen: Unremarkable.  Adrenals: There are low-density nodules involving the upper and central left adrenal gland. These are stable from 2010, consistent with adenomas. The largest nodule, located in the body, measures 18 mm.  Kidneys and ureters: Bilateral renal cysts. Renal hilar calcifications appear arterial. No hydronephrosis.  Bladder: Unremarkable.  Reproductive: Fiducial markers noted within the prostate. The prostate is mildly enlarged, uplifting the bladder base, but stable from 2010.  Bowel: No obstruction.  Retroperitoneum: There are enlarged nodes in the deep liver drainage (porta hepatis and portacaval stations) with hypoenhancing centers similar to the hepatic masses. These measure approximately 12 mm in short axis. Additionally, there are newly enlarged periaortic nodes the level of the renal hila, measuring 15 mm in short axis. There is a node anterior to the right common iliac artery which is newly enlarged at 14 mm short axis.  Peritoneum: No ascites or pneumoperitoneum.  Vascular: No acute abnormality.  OSSEOUS: There are are diffuse sclerotic bone lesions in the image spine and bilateral bony pelvis. No acute fracture. There is advanced lumbar degenerative disc disease with advanced spinal canal stenosis at L3-4 and L4-5.  IMPRESSION: 1. Hepatic,  nodal, and osseous metastatic disease. In this patient with history of prostate cancer, recommend PSA correlation. 2. Left adrenal nodules are stable from 2010, consistent with adenomas. 3. Cholelithiasis. There is pericholecystic edema, but no gallbladder distention to suggest cystic duct obstruction.   Electronically Signed   By: Jorje Guild M.D.   On: 08/08/2013 06:15   Dg Chest Port 1 View  08/16/2013   CLINICAL DATA:  SHORTNESS OF BREATH  EXAM: PORTABLE CHEST - 1 VIEW  COMPARISON:  Portable chest  radiograph 08/05/2013  FINDINGS: Visualized cardiac silhouette stable. Persistent right lower lobe opacity with increased conspicuity silhouetting the right heart border. There is prominence of interstitial markings. Stable fullness in the left hilar region. No new focal regions of consolidation or new focal infiltrates. Osteoarthritic changes in the shoulders. No acute osseous abnormalities.  IMPRESSION: Stable pulmonary opacity right lower lobe. Differential considerations again include pneumonia, pulmonary hemorrhage, component of atelectasis.  Chest radiograph otherwise stable.   Electronically Signed   By: Margaree Mackintosh M.D.   On: 08/16/2013 15:35   Dg Chest Port 1 View  08/05/2013   CLINICAL DATA:  Shortness of breath. Hypoxia. History of prostate cancer. Recent diagnosis of extensive bilateral pulmonary embolus on 07/10/2013  EXAM: PORTABLE CHEST - 1 VIEW  COMPARISON:  Multiple exams, including 07/24/2013  FINDINGS: Continued airspace opacity in the right middle lobe. Significant increase in airspace opacity in the right lower lobe now obscuring the right hemidiaphragm.  Low lung volumes are present, causing crowding of the pulmonary vasculature. Left hilar fullness compatible with known hilar adenopathy. Interstitial accentuation and parts of the right lung specially the periphery of the mid lung.  IMPRESSION: 1. Worsened airspace opacity in the right lower lobe, potentially from pulmonary hemorrhage  or pneumonia. Continued right middle lobe airspace opacity. 2. Hilar fullness on the left compatible with hilar adenopathy. 3. Low lung volumes.   Electronically Signed   By: Sherryl Barters M.D.   On: 08/05/2013 19:20    CBC  Recent Labs Lab 08/16/13 1509 08/17/13 0517  WBC 16.7* 21.5*  HGB 16.9 14.8  HCT 51.1 44.1  PLT 159 133*  MCV 89.8 88.7  MCH 29.7 29.8  MCHC 33.1 33.6  RDW 15.2 15.0    Chemistries   Recent Labs Lab 08/16/13 1509 08/17/13 0517  NA 138 139  K 5.0 5.0  CL 96 101  CO2 25 23  GLUCOSE 160* 183*  BUN 17 26*  CREATININE 1.29 1.19  CALCIUM 8.9 8.6  AST  --  30  ALT  --  37  ALKPHOS  --  303*  BILITOT  --  0.7   ------------------------------------------------------------------------------------------------------------------ estimated creatinine clearance is 63.9 ml/min (by C-G formula based on Cr of 1.19). ------------------------------------------------------------------------------------------------------------------ No results found for this basename: HGBA1C,  in the last 72 hours ------------------------------------------------------------------------------------------------------------------ No results found for this basename: CHOL, HDL, LDLCALC, TRIG, CHOLHDL, LDLDIRECT,  in the last 72 hours ------------------------------------------------------------------------------------------------------------------ No results found for this basename: TSH, T4TOTAL, FREET3, T3FREE, THYROIDAB,  in the last 72 hours ------------------------------------------------------------------------------------------------------------------ No results found for this basename: VITAMINB12, FOLATE, FERRITIN, TIBC, IRON, RETICCTPCT,  in the last 72 hours  Coagulation profile No results found for this basename: INR, PROTIME,  in the last 168 hours  No results found for this basename: DDIMER,  in the last 72 hours  Cardiac Enzymes  Recent Labs Lab 08/16/13 1509   TROPONINI <0.30   ------------------------------------------------------------------------------------------------------------------ No components found with this basename: POCBNP,     Saliah Crisp D.O. on 08/17/2013 at 12:04 PM  Between 7am to 7pm - Pager - [phone]  After 7pm go to [email or web address] - password [email or web address]  And look for the night coverage person covering for me after hours  [facility] Office  [phone]

## 2013-08-17 NOTE — Progress Notes (Signed)
UR chart review completed.  

## 2013-08-18 ENCOUNTER — Encounter (HOSPITAL_COMMUNITY): Payer: Self-pay

## 2013-08-18 ENCOUNTER — Inpatient Hospital Stay (HOSPITAL_COMMUNITY): Payer: Medicare Other

## 2013-08-18 DIAGNOSIS — J449 Chronic obstructive pulmonary disease, unspecified: Secondary | ICD-10-CM

## 2013-08-18 DIAGNOSIS — Z8546 Personal history of malignant neoplasm of prostate: Secondary | ICD-10-CM

## 2013-08-18 DIAGNOSIS — C801 Malignant (primary) neoplasm, unspecified: Secondary | ICD-10-CM

## 2013-08-18 LAB — CBC
HCT: 40.5 % (ref 39.0–52.0)
HEMOGLOBIN: 13.4 g/dL (ref 13.0–17.0)
MCH: 29.6 pg (ref 26.0–34.0)
MCHC: 33.1 g/dL (ref 30.0–36.0)
MCV: 89.4 fL (ref 78.0–100.0)
Platelets: 160 10*3/uL (ref 150–400)
RBC: 4.53 MIL/uL (ref 4.22–5.81)
RDW: 15.3 % (ref 11.5–15.5)
WBC: 22.8 10*3/uL — AB (ref 4.0–10.5)

## 2013-08-18 LAB — LEGIONELLA ANTIGEN, URINE: LEGIONELLA ANTIGEN, URINE: NEGATIVE

## 2013-08-18 LAB — GLUCOSE, SEROUS FLUID: Glucose, Fluid: 97 mg/dL

## 2013-08-18 LAB — BASIC METABOLIC PANEL
Anion gap: 11 (ref 5–15)
BUN: 26 mg/dL — ABNORMAL HIGH (ref 6–23)
CO2: 24 meq/L (ref 19–32)
Calcium: 8.4 mg/dL (ref 8.4–10.5)
Chloride: 105 mEq/L (ref 96–112)
Creatinine, Ser: 0.99 mg/dL (ref 0.50–1.35)
GFR calc Af Amer: >90 mL/min (ref >90)
GFR calc non Af Amer: 82 mL/min — ABNORMAL LOW (ref >90)
Glucose, Bld: 113 mg/dL — ABNORMAL HIGH (ref 70–99)
POTASSIUM: 4.7 meq/L (ref 3.7–5.3)
SODIUM: 140 meq/L (ref 137–147)

## 2013-08-18 LAB — BODY FLUID CELL COUNT WITH DIFFERENTIAL
Lymphs, Fluid: 3 %
Monocyte-Macrophage-Serous Fluid: 5 % — ABNORMAL LOW (ref 50–90)
NEUTROPHIL FLUID: 95 % — AB (ref 0–25)
Total Nucleated Cell Count, Fluid: 32100 cu mm — ABNORMAL HIGH (ref 0–1000)

## 2013-08-18 LAB — SEX HORMONE BINDING GLOBULIN: Sex Hormone Binding: 60 nmol/L (ref 13–71)

## 2013-08-18 LAB — PROTEIN, BODY FLUID: TOTAL PROTEIN, FLUID: 4.4 g/dL

## 2013-08-18 LAB — TESTOSTERONE, % FREE: TESTOSTERONE-% FREE: 1.3 % — AB (ref 1.6–2.9)

## 2013-08-18 LAB — LACTATE DEHYDROGENASE, PLEURAL OR PERITONEAL FLUID: LD, Fluid: 1383 U/L — ABNORMAL HIGH (ref 3–23)

## 2013-08-18 LAB — TESTOSTERONE, FREE: TESTOSTERONE FREE: 25.3 pg/mL — AB (ref 47.0–244.0)

## 2013-08-18 LAB — TESTOSTERONE: Testosterone: 199 ng/dL — ABNORMAL LOW (ref 300–890)

## 2013-08-18 NOTE — Progress Notes (Signed)
ANTIBIOTIC CONSULT NOTE - follow up  Pharmacy Consult for Vancomycin & Aztreonam Indication: pneumonia  No Known Allergies  Patient Measurements: Height: 6\' 1"  (185.4 cm) Weight: 170 lb (77.111 kg) IBW/kg (Calculated) : 79.9  Vital Signs: Temp: 97.3 F (36.3 C) (07/07 0555) Temp src: Oral (07/07 0555) BP: 110/77 mmHg (07/07 0555) Pulse Rate: 69 (07/07 0555) Intake/Output from previous day: 07/06 0701 - 07/07 0700 In: 2030 [P.O.:1080; I.V.:650; IV Piggyback:300] Out: 2375 [Urine:2375] Intake/Output from this shift: Total I/O In: 960 [P.O.:960] Out: 875 [Urine:875]  Labs:  Recent Labs  08/16/13 1509 08/17/13 0517 08/18/13 0521  WBC 16.7* 21.5* 22.8*  HGB 16.9 14.8 13.4  PLT 159 133* 160  CREATININE 1.29 1.19 0.99   Estimated Creatinine Clearance: 76.8 ml/min (by C-G formula based on Cr of 0.99). No results found for this basename: VANCOTROUGH, Corlis Leak, VANCORANDOM, [place], [place]PEAK, [place], [place], TOBRAPEAK, TOBRARND, AMIKACINPEAK, AMIKACINTROU, AMIKACIN,  in the last 72 hours   Microbiology: Recent Results (from the past 720 hour(s))  CULTURE, BLOOD (ROUTINE X 2)     Status: None   Collection Time    08/05/13  7:55 PM      Result Value Ref Range Status   Specimen Description BLOOD LEFT ANTECUBITAL   Final   Special Requests BOTTLES DRAWN AEROBIC AND ANAEROBIC [place]   Final   Culture NO GROWTH 5 DAYS   Final   Report Status 08/10/2013 FINAL   Final  CULTURE, BLOOD (ROUTINE X 2)     Status: None   Collection Time    08/05/13  8:02 PM      Result Value Ref Range Status   Specimen Description BLOOD LEFT HAND   Final   Special Requests BOTTLES DRAWN AEROBIC AND ANAEROBIC [place]   Final   Culture NO GROWTH 5 DAYS   Final   Report Status 08/10/2013 FINAL   Final  MRSA PCR SCREENING     Status: None   Collection Time    08/06/13  5:18 PM      Result Value Ref Range Status   MRSA by PCR NEGATIVE  NEGATIVE Final   Comment:            The GeneXpert  MRSA Assay (FDA     approved for NASAL specimens     only), is one component of a     comprehensive MRSA colonization     surveillance program. It is not     intended to diagnose MRSA     infection nor to guide or     monitor treatment for     MRSA infections.  CULTURE, EXPECTORATED SPUTUM-ASSESSMENT     Status: None   Collection Time    08/17/13  8:48 PM      Result Value Ref Range Status   Specimen Description SPUTUM   Final   Special Requests NONE   Final   Sputum evaluation     Final   Value: THIS SPECIMEN IS ACCEPTABLE. RESPIRATORY CULTURE REPORT TO FOLLOW.     PERFORMED AT [facility]   Report Status 08/17/2013 FINAL   Final   Medical History: Past Medical History  Diagnosis Date  . H/O renal calculi   . Urethral stricture   . Wears hearing aid     right ear  . Hearing impaired person, right   . Arthritis   . Macular degeneration   . Ulcerative colitis   . History of kidney stones   . Pulmonary embolus 07/10/2013  . DVT, lower extremity  07/10/2013    LLE  . COPD (chronic obstructive pulmonary disease)   . Prostate cancer 10/24/07    radiation therapy  . Metastatic disease     unknown source, found on CT A/P 08/08/2013   Medications:  Scheduled:  . antiseptic oral rinse  15 mL Mouth Rinse BID  . aztreonam  2 g Intravenous Q8H  . budesonide-formoterol  2 puff Inhalation BID  . folic acid  1 mg Oral Daily  . multivitamin with minerals  1 tablet Oral Daily  . thiamine  100 mg Oral Daily  . vancomycin  750 mg Intravenous Q12H   Assessment: 68 yo M with recurrent PNA.  He has been hospitalized recently for PNA so plan to treat with empiric, broad-spectrum antibiotics for HCAP.  WBC is elevated & continues to worsen.  Patient was febrile on admission but fever has resolved.  CXR + PNA. SCr has improved since admission.  Estimated Creatinine Clearance: 76.8 ml/min (by C-G formula based on Cr of 0.99).  Vancomycin 7/6>> Aztreonam 7/6>>  Goal of Therapy:  Vancomycin trough  level 15-20 mcg/ml  Plan:  Continue Aztreonam Vancomycin 750mg  IV q12h Check Vancomycin trough at steady state Monitor renal function and cx data   Hart Robinsons A 08/18/2013,2:09 PM

## 2013-08-18 NOTE — Consult Note (Signed)
Consult requested by: [facility] Consult requested for recurrent pneumonia:  HPI: This is a 68 year old who has been in the hospital on several occasions with pneumonia and who gets better while on IV antibiotics and then when he gets switched over to oral antibiotics tends to have recurrence. This is complicated by the fact that he is known to have prostate cancer and appears to have metastatic disease to his liver mediastinum and perhaps his lung. He is unaware of any swallowing problems. He does have COPD.  Past Medical History  Diagnosis Date  . H/O renal calculi   . Urethral stricture   . Wears hearing aid     right ear  . Hearing impaired person, right   . Arthritis   . Macular degeneration   . Ulcerative colitis   . History of kidney stones   . Pulmonary embolus 07/10/2013  . DVT, lower extremity 07/10/2013    LLE  . COPD (chronic obstructive pulmonary disease)   . Prostate cancer 10/24/07    radiation therapy  . Metastatic disease     unknown source, found on CT A/P 08/08/2013     Family History  Problem Relation Age of Onset  . Cancer Mother     colon  . Cancer Paternal Grandmother     colon     History   Social History  . Marital Status: Married    Spouse Name: N/A    Number of Children: N/A  . Years of Education: N/A   Social History Main Topics  . Smoking status: Former Smoker -- 1.00 packs/day for 58 years    Types: Cigarettes    Quit date: 07/13/2013  . Smokeless tobacco: Former Systems developer     Comment: trying to quit.  . Alcohol Use: Yes     Comment: occasional  . Drug Use: No  . Sexual Activity: Yes    Birth Control/ Protection: None   Other Topics Concern  . None   Social History Narrative  . None     ROS: He says he has chest pain on the right. He denies nausea or vomiting. As mentioned he does not know of any swallowing problems. He hears himself wheeze. He does not have hemoptysis.    Objective: Vital signs in last 24 hours: Temp:   [97.3 F (36.3 C)-98.1 F (36.7 C)] 97.3 F (36.3 C) (07/07 0555) Pulse Rate:  [69-82] 69 (07/07 0555) Resp:  [18] 18 (07/07 0555) BP: (108-116)/(71-77) 110/77 mmHg (07/07 0555) SpO2:  [86 %-92 %] 91 % (07/07 0555) Weight change:  Last BM Date: 08/18/13  Intake/Output from previous day: 07/06 0701 - 07/07 0700 In: 2030 [P.O.:1080; I.V.:650; IV Piggyback:300] Out: 2375 [Urine:2375]  PHYSICAL EXAM He is awake and alert. His pupils are reactive. Nose and throat clear. Neck is supple. I don't feel any lymph nodes. His chest shows diminished breath sounds on the right. His heart is regular. His abdomen is soft and I am unable to palpate his liver. Extremities showed no edema. Central nervous system exam is grossly intact  Lab Results: Basic Metabolic Panel:  Recent Labs  08/17/13 0517 08/18/13 0521  NA 139 140  K 5.0 4.7  CL 101 105  CO2 23 24  GLUCOSE 183* 113*  BUN 26* 26*  CREATININE 1.19 0.99  CALCIUM 8.6 8.4   Liver Function Tests:  Recent Labs  08/17/13 0517  AST 30  ALT 37  ALKPHOS 303*  BILITOT 0.7  PROT 6.6  ALBUMIN 2.1*  No results found for this basename: LIPASE, AMYLASE,  in the last 72 hours No results found for this basename: AMMONIA,  in the last 72 hours CBC:  Recent Labs  08/17/13 0517 08/18/13 0521  WBC 21.5* 22.8*  HGB 14.8 13.4  HCT 44.1 40.5  MCV 88.7 89.4  PLT 133* 160   Cardiac Enzymes:  Recent Labs  08/16/13 1509  TROPONINI <0.30   BNP:  Recent Labs  08/16/13 1509  PROBNP 230.8*   D-Dimer: No results found for this basename: DDIMER,  in the last 72 hours CBG: No results found for this basename: GLUCAP,  in the last 72 hours Hemoglobin A1C: No results found for this basename: HGBA1C,  in the last 72 hours Fasting Lipid Panel: No results found for this basename: CHOL, HDL, LDLCALC, TRIG, CHOLHDL, LDLDIRECT,  in the last 72 hours Thyroid Function Tests:  Recent Labs  08/16/13 2119  TSH 0.397   Anemia Panel: No  results found for this basename: VITAMINB12, FOLATE, FERRITIN, TIBC, IRON, RETICCTPCT,  in the last 72 hours Coagulation:  Recent Labs  08/17/13 1238  LABPROT 22.9*  INR 2.02*   Urine Drug Screen: Drugs of Abuse  No results found for this basename: labopia, cocainscrnur, labbenz, amphetmu, thcu, labbarb    Alcohol Level: No results found for this basename: ETH,  in the last 72 hours Urinalysis: No results found for this basename: COLORURINE, APPERANCEUR, LABSPEC, PHURINE, GLUCOSEU, HGBUR, BILIRUBINUR, KETONESUR, PROTEINUR, UROBILINOGEN, NITRITE, LEUKOCYTESUR,  in the last 72 hours Misc. Labs:   ABGS: No results found for this basename: PHART, PCO2, PO2ART, TCO2, HCO3,  in the last 72 hours   MICROBIOLOGY: Recent Results (from the past 240 hour(s))  CULTURE, EXPECTORATED SPUTUM-ASSESSMENT     Status: None   Collection Time    08/17/13  8:48 PM      Result Value Ref Range Status   Specimen Description SPUTUM   Final   Special Requests NONE   Final   Sputum evaluation     Final   Value: THIS SPECIMEN IS ACCEPTABLE. RESPIRATORY CULTURE REPORT TO FOLLOW.     PERFORMED AT [facility]   Report Status 08/17/2013 FINAL   Final    Studies/Results: Ct Head W Wo Contrast  08/17/2013   CLINICAL DATA:  Staging for metastatic disease.  Unknown primary.  EXAM: CT HEAD WITHOUT AND WITH CONTRAST  TECHNIQUE: Contiguous axial images were obtained from the base of the skull through the vertex without and with intravenous contrast  CONTRAST:  19mL OMNIPAQUE IOHEXOL 300 MG/ML  SOLN  COMPARISON:  None.  FINDINGS: No evidence for acute infarction, hemorrhage, mass lesion, hydrocephalus, or extra-axial fluid. Mild atrophy. Mild chronic microvascular ischemic change. Post infusion, no abnormal enhancement of the brain or meninges. Vascular calcification. No acute sinus or mastoid disease. No osseous lesions.  IMPRESSION: Chronic changes as described. No acute intracranial abnormality. No enhancing lesions to  suggest metastatic disease.   Electronically Signed   By: Rolla Flatten M.D.   On: 08/17/2013 17:21   Ct Angio Chest Pe W/cm &/or Wo Cm  08/16/2013   CLINICAL DATA:  Shortness of breath.  EXAM: CT ANGIOGRAPHY CHEST WITH CONTRAST  TECHNIQUE: Multidetector CT imaging of the chest was performed using the standard protocol during bolus administration of intravenous contrast. Multiplanar CT image reconstructions and MIPs were obtained to evaluate the vascular anatomy.  CONTRAST:  120mL OMNIPAQUE IOHEXOL 350 MG/ML SOLN  COMPARISON:  08/05/2013  FINDINGS: Small amount of residual eccentric chronic what again  seen within the main right pulmonary artery and proximal right lower lobe pulmonary artery, not significantly changed. No new pulmonary embolus.  Increasing right pleural effusion, now moderate. Continued volume loss and air bronchograms within the right middle lobe, stable. Increasing airspace disease in the right lower lobe. Moderate emphysematous changes. Minimal scattered opacities noted in the left lower lobe at the left lung base and in the lingula.  Small nodule in the lingula measures 5 mm on image 60, stable. Adenopathy in the mediastinum and hila again noted, unchanged. AP window lymph node has a short axis diameter of 16 mm on image 37. Left hilar lymph node has a short axis diameter of 14 mm on image 41. Left hilar lymph node on image 53 as a short axis diameter of 19 mm. Other enlarged AP window, left hilar and paratracheal lymph nodes are also stable.  Heart is borderline in size. Coronary artery calcifications. Aorta is normal caliber.  Imaging into the upper abdomen again demonstrates multiple low-density lesions throughout the liver compatible with metastases, likely not significantly changed. Sclerotic foci throughout the thoracic spine are stable, likely bone metastases.  Review of the MIP images confirms the above findings.  IMPRESSION: Stable chronic small pulmonary embolus in the right main and  lower lobe pulmonary arteries. This is nonocclusive. No new pulmonary embolus.  Enlarging right pleural effusion with increasing right lower lobe consolidation. Stable volume loss and air bronchograms in the medial right middle lobe.  Stable mediastinal and left hilar adenopathy compatible with metastatic disease. Stable multiple ill-defined hepatic metastases. Stable sclerotic bone metastases.   Electronically Signed   By: Rolm Baptise M.D.   On: 08/16/2013 18:26   Dg Chest Port 1 View  08/16/2013   CLINICAL DATA:  SHORTNESS OF BREATH  EXAM: PORTABLE CHEST - 1 VIEW  COMPARISON:  Portable chest radiograph 08/05/2013  FINDINGS: Visualized cardiac silhouette stable. Persistent right lower lobe opacity with increased conspicuity silhouetting the right heart border. There is prominence of interstitial markings. Stable fullness in the left hilar region. No new focal regions of consolidation or new focal infiltrates. Osteoarthritic changes in the shoulders. No acute osseous abnormalities.  IMPRESSION: Stable pulmonary opacity right lower lobe. Differential considerations again include pneumonia, pulmonary hemorrhage, component of atelectasis.  Chest radiograph otherwise stable.   Electronically Signed   By: Margaree Mackintosh M.D.   On: 08/16/2013 15:35    Medications:  Prior to Admission:  Prescriptions prior to admission  Medication Sig Dispense Refill  . albuterol (PROAIR HFA) 108 (90 BASE) MCG/ACT inhaler Inhale 2 puffs into the lungs every 6 (six) hours as needed for wheezing or shortness of breath.      Marland Kitchen amoxicillin-clavulanate (AUGMENTIN) 875-125 MG per tablet Take 1 tablet by mouth every 12 (twelve) hours. Takes for 7 days.  Dosing began 08/09/2013.      . budesonide-formoterol (SYMBICORT) 160-4.5 MCG/ACT inhaler Inhale 2 puffs into the lungs 2 (two) times daily.  1 Inhaler  6  . rivaroxaban (XARELTO) 20 MG TABS tablet Take 1 tablet (20 mg total) by mouth daily with supper.  30 tablet  3   Scheduled: .  antiseptic oral rinse  15 mL Mouth Rinse BID  . aztreonam  2 g Intravenous Q8H  . budesonide-formoterol  2 puff Inhalation BID  . folic acid  1 mg Oral Daily  . multivitamin with minerals  1 tablet Oral Daily  . thiamine  100 mg Oral Daily  . vancomycin  750 mg Intravenous Q12H  Continuous: . sodium chloride 50 mL/hr at 08/17/13 2102   WUJ:[id number], acetaminophen, albuterol, alum & mag hydroxide-simeth, ondansetron (ZOFRAN) IV, ondansetron, oxyCODONE  Assesment: He has recurrent pneumonia. The most common causes of that wiould be immunodeficiency , aspiration or perhaps he has an organism that is only sensitive to IV medications. I think that's probably least likely. I think it's entirely possible that he has this recurrent pneumonia because of his underlying malignancy. Active Problems:   Pulmonary embolism   Lung nodule   HCAP (healthcare-associated pneumonia)   COPD (chronic obstructive pulmonary disease)-PFT pending    Pneumonia    Plan: I agree with thoracentesis. I have requested speech evaluation to rule out chronic aspiration    LOS: 2 days   Jonathan Goodwin 08/18/2013, 9:03 AM

## 2013-08-18 NOTE — Progress Notes (Signed)
[facility]                                                                              Patient Demographics  Jonathan Goodwin, is a 68 y.o. male, DOB - [birth year], JAS:[id number]  Admit date - 08/16/2013   Admitting Physician Allyne Gee, MD  Outpatient Primary MD for the patient is Jonathan Goodwin, [place]  LOS - 2   Chief Complaint  Patient presents with  . Shortness of Breath      HPI: Jonathan Goodwin is a 68 y.o. male with recurring bouts of pneumonia presents to the ED after a recent discharge for the same. Patient sated that he has noted increased cough and shortness of breath. He stated he cannot lay down. He stated that whenever he comes in for IV antibiotics he appears to improve but when he is switched to oral treatment and sent home his symptoms seem to get worse. Patient stated that he has noted wheeze. He also stated that he has had prostate cancer and is being followed by urology. Recently he was noted to have disease in the liver and is actually scheduled for a biopsy. He stated that he is also to see oncology for the lung and chest mets noted on the scan.  Interim history Patient will undergo thoracentesis on 08/18/2013. Will order appropriate labs as well as cytology. Spoke with radiology at [facility] for possible liver biopsy, they feel that cytology from the thoracentesis should be obtained first before moving ahead with liver biopsy. Oncology as well as pulmonology has been consulted.  Assessment & Plan   Sepsis secondary to recurrent HCAP -Upon admission, patient was febrile with leukocytosis and tachycardia -Treatment and plan as below  Dyspnea secondary to recurrent HCAP with pleural effusion -Patient recently discharged 6/28 for pneumonia -CT angio shows no PE, moderate right sided pleural effusion -Continue vanc/aztreonam -Leukocytosis worsening -Thoracentesis by IR today, will order appropriate labs as well as cytology -Pulmonology consulted, and requested  speech evaluation for questionable aspiration  Pulmonary Embolism -On Xarelto -Held for thoracentesis and possible liver biopsy  Prostate Cancer -being monitored and treated by urology  ?Metastatic Liver disease -Patient is supposed to have live biopsy as an outpatient -Oncology consulted and appreciated -Spoke with IR at [facility], pending results of thoracentesis and cytology, may move ahead with liver biopsy  COPD -Continue symbicort  Nicotine abuse -Patient counseled  Code Status: Full  Family Communication: None at bedside  Disposition Plan: Admitted  Time Spent in minutes   25 minutes  Procedures  None  Consults   Interventional radiology Pulmonology  Oncology  DVT Prophylaxis  SCDs  Lab Results  Component Value Date   PLT 160 08/18/2013    Medications  Scheduled Meds: . antiseptic oral rinse  15 mL Mouth Rinse BID  . aztreonam  2 g Intravenous Q8H  . budesonide-formoterol  2 puff Inhalation BID  . folic acid  1 mg Oral Daily  . multivitamin with minerals  1 tablet Oral Daily  . thiamine  100 mg Oral Daily  . vancomycin  750 mg Intravenous Q12H   Continuous Infusions: . sodium chloride 50 mL/hr at 08/17/13 2102  PRN Meds:.acetaminophen, acetaminophen, albuterol, alum & mag hydroxide-simeth, ondansetron (ZOFRAN) IV, ondansetron, oxyCODONE  Antibiotics    Anti-infectives   Start     Dose/Rate Route Frequency Ordered Stop   08/17/13 0600  vancomycin (VANCOCIN) IVPB 750 mg/150 ml premix     750 mg 150 mL/hr over 60 Minutes Intravenous Every 12 hours 08/16/13 2114     08/16/13 2100  aztreonam (AZACTAM) 2 g in dextrose 5 % 50 mL IVPB     2 g 100 mL/hr over 30 Minutes Intravenous Every 8 hours 08/16/13 2000 08/24/13 2059   08/16/13 1730  vancomycin (VANCOCIN) IVPB 1000 mg/200 mL premix     1,000 mg 200 mL/hr over 60 Minutes Intravenous  Once 08/16/13 1720 08/16/13 1907   08/16/13 1715  ceFEPIme (MAXIPIME) 1 g in dextrose 5 % 50 mL IVPB     1 g 100  mL/hr over 30 Minutes Intravenous  Once 08/16/13 1700 08/16/13 1800      Subjective:   Matt Holmes seen and examined today.  Patient is very anxious about his situation.  He would like a long term plan instead of a day by day plan. Patient would like a liver biopsy.  He currently states his breathing is the same and complains of right sided chest/rib pain.  Denies abdominal pain, N/V/C/D.   Objective:   Filed Vitals:   08/17/13 1907 08/17/13 1956 08/18/13 0555 08/18/13 1050  BP:  108/71 110/77   Pulse:  82 69   Temp:  97.6 F (36.4 C) 97.3 F (36.3 C)   TempSrc:  Oral Oral   Resp:  18 18   Height:      Weight:      SpO2: 92% 92% 91% 92%    Wt Readings from Last 3 Encounters:  08/16/13 77.111 kg (170 lb)  08/05/13 90.719 kg (200 lb)  07/24/13 91.173 kg (201 lb)     Intake/Output Summary (Last 24 hours) at 08/18/13 1101 Last data filed at 08/18/13 0900  Gross per 24 hour  Intake   2150 ml  Output   2850 ml  Net   -700 ml    Exam  General: Well developed, well nourished, NAD, appears stated age  HEENT: NCAT, mucous membranes moist.   Neck: Supple, no JVD, no masses  Cardiovascular: S1 S2 auscultated, no rubs, murmurs or gallops. Regular rate and rhythm.  Respiratory: Breath sounds clear, however, diminished on the right  Abdomen: Soft, nontender, mildly distended, + bowel sounds  Extremities: warm dry without cyanosis clubbing or edema  Neuro: AAOx3, No focal deficits  Skin: Without rashes exudates or nodules  Psych: Normal affect and demeanor with intact judgement and insight  Data Review   Micro Results Recent Results (from the past 240 hour(s))  CULTURE, EXPECTORATED SPUTUM-ASSESSMENT     Status: None   Collection Time    08/17/13  8:48 PM      Result Value Ref Range Status   Specimen Description SPUTUM   Final   Special Requests NONE   Final   Sputum evaluation     Final   Value: THIS SPECIMEN IS ACCEPTABLE. RESPIRATORY CULTURE REPORT TO  FOLLOW.     PERFORMED AT [facility]   Report Status 08/17/2013 FINAL   Final    Radiology Reports Dg Chest 2 View  07/24/2013   CLINICAL DATA:  Pneumonia  EXAM: CHEST  2 VIEW  COMPARISON:  July 21, 2013 chest radiograph and Jul 10, 2013 chest CT angiogram  FINDINGS: There is  underlying emphysematous change. Extensive interstitial and patchy alveolar infiltrate remain in the right mid and lower lung zones. The left lung appears clear. The heart size is normal. Pulmonary vascularity reflects underlying emphysematous change. No adenopathy.  IMPRESSION: Extensive infiltrate on the right, stable from most recent chest radiograph. Underlying emphysema. No new opacity. It is difficult to ascertain how much of the infiltrate on the right is secondary to the recent pulmonary emboli versus infection. Both entities may exist concurrently.   Electronically Signed   By: Lowella Grip M.D.   On: 07/24/2013 15:32   Ct Angio Chest Pe W/cm &/or Wo Cm  08/16/2013   CLINICAL DATA:  Shortness of breath.  EXAM: CT ANGIOGRAPHY CHEST WITH CONTRAST  TECHNIQUE: Multidetector CT imaging of the chest was performed using the standard protocol during bolus administration of intravenous contrast. Multiplanar CT image reconstructions and MIPs were obtained to evaluate the vascular anatomy.  CONTRAST:  140mL OMNIPAQUE IOHEXOL 350 MG/ML SOLN  COMPARISON:  08/05/2013  FINDINGS: Small amount of residual eccentric chronic what again seen within the main right pulmonary artery and proximal right lower lobe pulmonary artery, not significantly changed. No new pulmonary embolus.  Increasing right pleural effusion, now moderate. Continued volume loss and air bronchograms within the right middle lobe, stable. Increasing airspace disease in the right lower lobe. Moderate emphysematous changes. Minimal scattered opacities noted in the left lower lobe at the left lung base and in the lingula.  Small nodule in the lingula measures 5 mm on image 60, stable.  Adenopathy in the mediastinum and hila again noted, unchanged. AP window lymph node has a short axis diameter of 16 mm on image 37. Left hilar lymph node has a short axis diameter of 14 mm on image 41. Left hilar lymph node on image 53 as a short axis diameter of 19 mm. Other enlarged AP window, left hilar and paratracheal lymph nodes are also stable.  Heart is borderline in size. Coronary artery calcifications. Aorta is normal caliber.  Imaging into the upper abdomen again demonstrates multiple low-density lesions throughout the liver compatible with metastases, likely not significantly changed. Sclerotic foci throughout the thoracic spine are stable, likely bone metastases.  Review of the MIP images confirms the above findings.  IMPRESSION: Stable chronic small pulmonary embolus in the right main and lower lobe pulmonary arteries. This is nonocclusive. No new pulmonary embolus.  Enlarging right pleural effusion with increasing right lower lobe consolidation. Stable volume loss and air bronchograms in the medial right middle lobe.  Stable mediastinal and left hilar adenopathy compatible with metastatic disease. Stable multiple ill-defined hepatic metastases. Stable sclerotic bone metastases.   Electronically Signed   By: Rolm Baptise M.D.   On: 08/16/2013 18:26   Ct Angio Chest W/cm &/or Wo Cm  08/05/2013   CLINICAL DATA:  Shortness of breath. History of kidney stents, previous pulmonary embolus, prostate cancer with radiation therapy.  EXAM: CT ANGIOGRAPHY CHEST WITH CONTRAST  TECHNIQUE: Multidetector CT imaging of the chest was performed using the standard protocol during bolus administration of intravenous contrast. Multiplanar CT image reconstructions and MIPs were obtained to evaluate the vascular anatomy.  CONTRAST:  147mL OMNIPAQUE IOHEXOL 350 MG/ML SOLN  COMPARISON:  07/10/2013  FINDINGS: Technically adequate study with moderately good opacification of the central and segmental pulmonary arteries. There  is a small amount of residual eccentric thrombus demonstrated in the distal right main and proximal right lower lobe pulmonary arteries. This demonstrates improvement since prior study consistent with response to  interval therapy. No new occlusive thrombus is demonstrated. Persistent increase in the right ventricular left ventricular ratio suggesting continued right heart strain.  Mild cardiac enlargement. Normal caliber thoracic aorta without dissection. Calcification of the aorta and Coronary arteries. Again demonstrated and similar to prior study, there is evidence of lymphadenopathy in the mediastinum and bilateral hilar regions. Since the prior study, there is increasing consolidation and volume loss with air bronchograms in the right middle lung with increasing airspace disease throughout the right lower lung. Small right pleural effusion is developing since the prior study. Atelectasis or infiltration also present in the left lung base. Prominent centrilobular emphysematous changes are demonstrated in the upper lungs bilaterally. No pneumothorax.  Visualized portions of the upper abdominal organs demonstrate multiple hypo enhancing nodules throughout the liver consistent with metastasis. Spleen size is normal. Left adrenal gland nodule measuring 21 mm diameter. Bilateral renal cysts. Abdominal findings are unchanged since prior study. Mild degenerative changes in the thoracic spine. Diffuse tip bone demineralization with small heterogeneous areas of sclerosis throughout. This is likely representing bony metastasis.  Review of the MIP images confirms the above findings.  IMPRESSION: 1. Overall decreasing clot burden in the pulmonary arteries since previous study with residual nonocclusive thrombus demonstrated on the right. Persistent changes suggesting right heart strain. 2. Increasing consolidation and airspace disease in the right lung could represent progressing pneumonia, nonspecific consolidated process,  or possibly obstruction due to mass or extrinsic compression. 3. Persistent changes, likely metastatic, involving mediastinal and hilar lymph nodes, multiple liver masses, left adrenal gland nodule, and heterogeneous bone sclerosis.   Electronically Signed   By: Lucienne Capers M.D.   On: 08/05/2013 21:17   Ct Abdomen Pelvis W Contrast  08/08/2013   CLINICAL DATA:  Evaluate for metastatic disease. Liver and adrenal mass cyst  EXAM: CT ABDOMEN AND PELVIS WITH CONTRAST  TECHNIQUE: Multidetector CT imaging of the abdomen and pelvis was performed using the standard protocol following bolus administration of intravenous contrast.  CONTRAST:  72mL OMNIPAQUE IOHEXOL 300 MG/ML SOLN, 136mL OMNIPAQUE IOHEXOL 300 MG/ML SOLN  COMPARISON:  09/03/2008  FINDINGS: BODY WALL: Unremarkable.  LOWER CHEST: Right lower and middle lobe airspace disease and interstitial thickening, recently evaluated by chest CT. There is a small right pleural effusion. Trace pericardial effusion. Coronary atherosclerosis.  ABDOMEN/PELVIS:  Liver: There are numerous hypo enhancing masses throughout the liver. The largest measuring 3.8 cm in the inferior right lobe.  Biliary: Cholelithiasis as seen previously. There is mild pericholecystic edema. No gallbladder distention to suggest biliary obstruction.  Pancreas: Unremarkable.  Spleen: Unremarkable.  Adrenals: There are low-density nodules involving the upper and central left adrenal gland. These are stable from 2010, consistent with adenomas. The largest nodule, located in the body, measures 18 mm.  Kidneys and ureters: Bilateral renal cysts. Renal hilar calcifications appear arterial. No hydronephrosis.  Bladder: Unremarkable.  Reproductive: Fiducial markers noted within the prostate. The prostate is mildly enlarged, uplifting the bladder base, but stable from 2010.  Bowel: No obstruction.  Retroperitoneum: There are enlarged nodes in the deep liver drainage (porta hepatis and portacaval stations)  with hypoenhancing centers similar to the hepatic masses. These measure approximately 12 mm in short axis. Additionally, there are newly enlarged periaortic nodes the level of the renal hila, measuring 15 mm in short axis. There is a node anterior to the right common iliac artery which is newly enlarged at 14 mm short axis.  Peritoneum: No ascites or pneumoperitoneum.  Vascular: No acute abnormality.  OSSEOUS:  There are are diffuse sclerotic bone lesions in the image spine and bilateral bony pelvis. No acute fracture. There is advanced lumbar degenerative disc disease with advanced spinal canal stenosis at L3-4 and L4-5.  IMPRESSION: 1. Hepatic, nodal, and osseous metastatic disease. In this patient with history of prostate cancer, recommend PSA correlation. 2. Left adrenal nodules are stable from 2010, consistent with adenomas. 3. Cholelithiasis. There is pericholecystic edema, but no gallbladder distention to suggest cystic duct obstruction.   Electronically Signed   By: Jorje Guild M.D.   On: 08/08/2013 06:15   Dg Chest Port 1 View  08/16/2013   CLINICAL DATA:  SHORTNESS OF BREATH  EXAM: PORTABLE CHEST - 1 VIEW  COMPARISON:  Portable chest radiograph 08/05/2013  FINDINGS: Visualized cardiac silhouette stable. Persistent right lower lobe opacity with increased conspicuity silhouetting the right heart border. There is prominence of interstitial markings. Stable fullness in the left hilar region. No new focal regions of consolidation or new focal infiltrates. Osteoarthritic changes in the shoulders. No acute osseous abnormalities.  IMPRESSION: Stable pulmonary opacity right lower lobe. Differential considerations again include pneumonia, pulmonary hemorrhage, component of atelectasis.  Chest radiograph otherwise stable.   Electronically Signed   By: Margaree Mackintosh M.D.   On: 08/16/2013 15:35   Dg Chest Port 1 View  08/05/2013   CLINICAL DATA:  Shortness of breath. Hypoxia. History of prostate cancer. Recent  diagnosis of extensive bilateral pulmonary embolus on 07/10/2013  EXAM: PORTABLE CHEST - 1 VIEW  COMPARISON:  Multiple exams, including 07/24/2013  FINDINGS: Continued airspace opacity in the right middle lobe. Significant increase in airspace opacity in the right lower lobe now obscuring the right hemidiaphragm.  Low lung volumes are present, causing crowding of the pulmonary vasculature. Left hilar fullness compatible with known hilar adenopathy. Interstitial accentuation and parts of the right lung specially the periphery of the mid lung.  IMPRESSION: 1. Worsened airspace opacity in the right lower lobe, potentially from pulmonary hemorrhage or pneumonia. Continued right middle lobe airspace opacity. 2. Hilar fullness on the left compatible with hilar adenopathy. 3. Low lung volumes.   Electronically Signed   By: Sherryl Barters M.D.   On: 08/05/2013 19:20    CBC  Recent Labs Lab 08/16/13 1509 08/17/13 0517 08/18/13 0521  WBC 16.7* 21.5* 22.8*  HGB 16.9 14.8 13.4  HCT 51.1 44.1 40.5  PLT 159 133* 160  MCV 89.8 88.7 89.4  MCH 29.7 29.8 29.6  MCHC 33.1 33.6 33.1  RDW 15.2 15.0 15.3    Chemistries   Recent Labs Lab 08/16/13 1509 08/17/13 0517 08/18/13 0521  NA 138 139 140  K 5.0 5.0 4.7  CL 96 101 105  CO2 25 23 24   GLUCOSE 160* 183* 113*  BUN 17 26* 26*  CREATININE 1.29 1.19 0.99  CALCIUM 8.9 8.6 8.4  AST  --  30  --   ALT  --  37  --   ALKPHOS  --  303*  --   BILITOT  --  0.7  --    ------------------------------------------------------------------------------------------------------------------ estimated creatinine clearance is 76.8 ml/min (by C-G formula based on Cr of 0.99). ------------------------------------------------------------------------------------------------------------------ No results found for this basename: HGBA1C,  in the last 72  hours ------------------------------------------------------------------------------------------------------------------ No results found for this basename: CHOL, HDL, LDLCALC, TRIG, CHOLHDL, LDLDIRECT,  in the last 72 hours ------------------------------------------------------------------------------------------------------------------  Recent Labs  08/16/13 2119  TSH 0.397   ------------------------------------------------------------------------------------------------------------------ No results found for this basename: VITAMINB12, FOLATE, FERRITIN, TIBC, IRON, RETICCTPCT,  in the  last 72 hours  Coagulation profile  Recent Labs Lab 08/17/13 1238  INR 2.02*    No results found for this basename: DDIMER,  in the last 72 hours  Cardiac Enzymes  Recent Labs Lab 08/16/13 1509  TROPONINI <0.30   ------------------------------------------------------------------------------------------------------------------ No components found with this basename: POCBNP,     Morine Kohlman D.O. on 08/18/2013 at 11:01 AM  Between 7am to 7pm - Pager - [phone]  After 7pm go to [email or web address] - password [email or web address]  And look for the night coverage person covering for me after hours  [facility] Office  [phone]

## 2013-08-18 NOTE — Progress Notes (Signed)
Subjective: Following the typical good morning introduction the following conversation took place:  Me: "Any complaints this morning?"  Mr. Plagge: "Don't ask me that."  Me: "What do you want me to ask you?  How can I help you if I cannot ask you that?  What question would you like me to ask you this morning?"  Mr. Denker: "I can always complain of something."  Me: "We all can.  I cannot do my job if I do not ask you the questions I need answered."  Mr. Venhuizen: "I am not used to being asked.  I am used to being told."  Me: "That is not how I operate.  We are a team; you and I."  Following this mildly heated exchange, I updated him on our plans of care which is as follows: 1. Thoracentesis today.  This will be sent for cytology. 2. Liver biopsy in the future (depending on results of thoracentesis cytology). 3. Based upon that data, we will develop the future plan of care.  Of note, his testosterone level is WNL and he is not at castrate levels.  This is important if the patient is diagnosed with metastatic prostate cancer as this will guide therapy choices.  Objective: Vital signs in last 24 hours: Temp:  [97.3 F (36.3 C)-98.1 F (36.7 C)] 97.3 F (36.3 C) (07/07 0555) Pulse Rate:  [69-82] 69 (07/07 0555) Resp:  [18] 18 (07/07 0555) BP: (108-116)/(71-77) 110/77 mmHg (07/07 0555) SpO2:  [86 %-92 %] 91 % (07/07 0555)  Intake/Output from previous day: 07/06 0800 - 07/07 0759 In: 2030 [P.O.:1080; I.V.:650; IV Piggyback:300] Out: 2375 [Urine:2375] Intake/Output this shift:    General appearance: alert, appears older than stated age, mild distress and [place] in place for O2 administration.  Lab Results:   Recent Labs  08/17/13 0517 08/18/13 0521  WBC 21.5* 22.8*  HGB 14.8 13.4  HCT 44.1 40.5  PLT 133* 160   BMET  Recent Labs  08/17/13 0517 08/18/13 0521  NA 139 140  K 5.0 4.7  CL 101 105  CO2 23 24  GLUCOSE 183* 113*  BUN 26* 26*  CREATININE 1.19 0.99  CALCIUM 8.6  8.4    Studies/Results: Ct Head W Wo Contrast  08/17/2013   CLINICAL DATA:  Staging for metastatic disease.  Unknown primary.  EXAM: CT HEAD WITHOUT AND WITH CONTRAST  TECHNIQUE: Contiguous axial images were obtained from the base of the skull through the vertex without and with intravenous contrast  CONTRAST:  59mL OMNIPAQUE IOHEXOL 300 MG/ML  SOLN  COMPARISON:  None.  FINDINGS: No evidence for acute infarction, hemorrhage, mass lesion, hydrocephalus, or extra-axial fluid. Mild atrophy. Mild chronic microvascular ischemic change. Post infusion, no abnormal enhancement of the brain or meninges. Vascular calcification. No acute sinus or mastoid disease. No osseous lesions.  IMPRESSION: Chronic changes as described. No acute intracranial abnormality. No enhancing lesions to suggest metastatic disease.   Electronically Signed   By: Rolla Flatten M.D.   On: 08/17/2013 17:21   Ct Angio Chest Pe W/cm &/or Wo Cm  08/16/2013   CLINICAL DATA:  Shortness of breath.  EXAM: CT ANGIOGRAPHY CHEST WITH CONTRAST  TECHNIQUE: Multidetector CT imaging of the chest was performed using the standard protocol during bolus administration of intravenous contrast. Multiplanar CT image reconstructions and MIPs were obtained to evaluate the vascular anatomy.  CONTRAST:  174mL OMNIPAQUE IOHEXOL 350 MG/ML SOLN  COMPARISON:  08/05/2013  FINDINGS: Small amount of residual eccentric chronic what again seen  within the main right pulmonary artery and proximal right lower lobe pulmonary artery, not significantly changed. No new pulmonary embolus.  Increasing right pleural effusion, now moderate. Continued volume loss and air bronchograms within the right middle lobe, stable. Increasing airspace disease in the right lower lobe. Moderate emphysematous changes. Minimal scattered opacities noted in the left lower lobe at the left lung base and in the lingula.  Small nodule in the lingula measures 5 mm on image 60, stable. Adenopathy in the  mediastinum and hila again noted, unchanged. AP window lymph node has a short axis diameter of 16 mm on image 37. Left hilar lymph node has a short axis diameter of 14 mm on image 41. Left hilar lymph node on image 53 as a short axis diameter of 19 mm. Other enlarged AP window, left hilar and paratracheal lymph nodes are also stable.  Heart is borderline in size. Coronary artery calcifications. Aorta is normal caliber.  Imaging into the upper abdomen again demonstrates multiple low-density lesions throughout the liver compatible with metastases, likely not significantly changed. Sclerotic foci throughout the thoracic spine are stable, likely bone metastases.  Review of the MIP images confirms the above findings.  IMPRESSION: Stable chronic small pulmonary embolus in the right main and lower lobe pulmonary arteries. This is nonocclusive. No new pulmonary embolus.  Enlarging right pleural effusion with increasing right lower lobe consolidation. Stable volume loss and air bronchograms in the medial right middle lobe.  Stable mediastinal and left hilar adenopathy compatible with metastatic disease. Stable multiple ill-defined hepatic metastases. Stable sclerotic bone metastases.   Electronically Signed   By: Rolm Baptise M.D.   On: 08/16/2013 18:26   Dg Chest Port 1 View  08/16/2013   CLINICAL DATA:  SHORTNESS OF BREATH  EXAM: PORTABLE CHEST - 1 VIEW  COMPARISON:  Portable chest radiograph 08/05/2013  FINDINGS: Visualized cardiac silhouette stable. Persistent right lower lobe opacity with increased conspicuity silhouetting the right heart border. There is prominence of interstitial markings. Stable fullness in the left hilar region. No new focal regions of consolidation or new focal infiltrates. Osteoarthritic changes in the shoulders. No acute osseous abnormalities.  IMPRESSION: Stable pulmonary opacity right lower lobe. Differential considerations again include pneumonia, pulmonary hemorrhage, component of  atelectasis.  Chest radiograph otherwise stable.   Electronically Signed   By: Margaree Mackintosh M.D.   On: 08/16/2013 15:35    Medications: I have reviewed the patient's current medications.  Assessment/Plan: 1. 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 Liver lesion biopsy will be performed in the future and sent for FoundationOne testing. Prostate cancer commonly spreads to bones, lymph nodes, lung, liver, and brain. 2. Pleural effusion, thoracentesis today.  Please send fluid for cytology. 3. PE, improving on recent CT angio of chest, on Xarelto. Xarelto on hold for thoracentesis today.  Recommend restarting Xarelto at 20 mg daily following thoracentesis.  4. H/O prostate cancer in 2009, managed by Dr. Maryland Pink and treated with radiation by Dr. Sondra Come at [facility], no systemic therapy ever administered.  5. COPD  6. Odd affect, CT head is negative for any metastatic disease.  Patient and plan discussed with Dr. Farrel Gobble and he is in agreement with the aforementioned.      LOS: 2 days    KEFALAS,THOMAS 08/18/2013

## 2013-08-18 NOTE — Progress Notes (Signed)
Shift Summary 7p-7a:  Pt remained alert and oriented to previously documented baseline. Pt slept well throughout the shift, no c/o pain. O2 maintained on 4L [place]. Sputum culture sent this shift, results pending. Safety/falls protocol maintained, no injuries this shift. Tolerated PO intake well, remained NPO after MN for possible biopsy.  IV fluids continue to infuse without difficulty, antibiotics administered as ordered. No concerns at this time, will continue with plan of care.

## 2013-08-18 NOTE — Procedures (Signed)
PreOperative Dx: RIGHT pleural effusion Postoperative Dx: RIGHT pleural effusion Procedure:   US guided RIGHT thoracentesis Radiologist:  Thornton Papas Anesthesia:  7 ml of 1 lidocaine Specimen:  800 ml of dark od bloody fluid EBL:   < 1 ml Complications: Tiny RIGHT apex pneumothorax on post-procedural chest radiograph; patient asymptomatic

## 2013-08-18 NOTE — Progress Notes (Signed)
Thoracentesis complete no signs of distress. 800 ml bloody colored ascites removed.

## 2013-08-19 ENCOUNTER — Ambulatory Visit (HOSPITAL_COMMUNITY): Admission: RE | Admit: 2013-08-19 | Payer: Medicare Other | Source: Ambulatory Visit

## 2013-08-19 DIAGNOSIS — J438 Other emphysema: Secondary | ICD-10-CM

## 2013-08-19 LAB — BASIC METABOLIC PANEL
Anion gap: 12 (ref 5–15)
BUN: 25 mg/dL — ABNORMAL HIGH (ref 6–23)
CO2: 23 mEq/L (ref 19–32)
CREATININE: 1.08 mg/dL (ref 0.50–1.35)
Calcium: 8.4 mg/dL (ref 8.4–10.5)
Chloride: 106 mEq/L (ref 96–112)
GFR calc non Af Amer: 68 mL/min — ABNORMAL LOW (ref >90)
GFR, EST AFRICAN AMERICAN: 79 mL/min — AB (ref >90)
Glucose, Bld: 113 mg/dL — ABNORMAL HIGH (ref 70–99)
POTASSIUM: 5.2 meq/L (ref 3.7–5.3)
SODIUM: 141 meq/L (ref 137–147)

## 2013-08-19 LAB — CBC
HCT: 44.6 % (ref 39.0–52.0)
Hemoglobin: 14.4 g/dL (ref 13.0–17.0)
MCH: 29.3 pg (ref 26.0–34.0)
MCHC: 32.3 g/dL (ref 30.0–36.0)
MCV: 90.7 fL (ref 78.0–100.0)
Platelets: 101 10*3/uL — ABNORMAL LOW (ref 150–400)
RBC: 4.92 MIL/uL (ref 4.22–5.81)
RDW: 15.8 % — ABNORMAL HIGH (ref 11.5–15.5)
WBC: 15.5 10*3/uL — ABNORMAL HIGH (ref 4.0–10.5)

## 2013-08-19 MED ORDER — POLYETHYLENE GLYCOL 3350 17 G PO PACK
17.0000 g | PACK | Freq: Two times a day (BID) | ORAL | Status: DC
  Administered 2013-08-20 – 2013-08-25 (×3): 17 g via ORAL
  Filled 2013-08-19 (×6): qty 1

## 2013-08-19 MED ORDER — MORPHINE SULFATE 2 MG/ML IJ SOLN
2.0000 mg | INTRAMUSCULAR | Status: DC | PRN
  Administered 2013-08-19 – 2013-08-22 (×10): 2 mg via INTRAVENOUS
  Filled 2013-08-19 (×10): qty 1

## 2013-08-19 MED ORDER — ENOXAPARIN SODIUM 80 MG/0.8ML ~~LOC~~ SOLN
80.0000 mg | Freq: Two times a day (BID) | SUBCUTANEOUS | Status: DC
  Administered 2013-08-19 – 2013-08-23 (×7): 80 mg via SUBCUTANEOUS
  Filled 2013-08-19 (×7): qty 0.8

## 2013-08-19 NOTE — Progress Notes (Signed)
Events of yesterday are noted. He clearly has an exudative pleural effusion. The white blood count is pretty high which could indicate infection but the description of this was not the like pus. I think it's more likely this is related to his presumed metastatic cancer. I discussed that with him today. Because he had a small pneumothorax I started him on incentive spirometry. Await cytology

## 2013-08-19 NOTE — Progress Notes (Signed)
PROGRESS NOTE  Jonathan Goodwin ZOX:[id number] DOB: 10/17/1945 DOA: 08/16/2013 PCP: [name], FNP  Summary: 68 year old man diagnosed with submassive pulmonary embolism with right ventricular strain, left lower extremity DVT, possible healthcare associated pneumonia 5/29, treated [name] who was rehospitalized 6/24-6/28 for suspected recurrent pneumonia. Imaging at that time suggested hepatic metastatic disease. On discharge plans were made for outpatient liver biopsy to further evaluate.  He presented to the emergency Department 7/5 with fever, recurrent cough and shortness of breath and was admitted for treatment of healthcare associated pneumonia and pleural effusion. He was seen by oncology and recommendations were to obtain thoracentesis, some fluid for cytology and obtain ultrasound-guided biopsy of the liver  Assessment/Plan: 1. Acute hypoxic respiratory failure. Stable on 4 L. Multifactorial including possible pneumonia, right pleural effusion, possible pulmonary infarction/necrosis, possible metastatic disease, chronic submassive pulmonary embolism, chronic without new emboli on CT. 2. Possible healthcare associated pneumonia with sepsis on admission. Legionella and strep pneumonia antigen negative. Blood cultures no growth today. Leukocytosis improving. 3. Right pleural effusion, exudative. Status post thoracentesis 7/7. Culture and cytology pending. 4. Tiny right apical pneumothorax post procedure thoracentesis. Management per pulmonology. 5. Thrombocytopenia. May be spurious. Significance unclear. Has not received any heparin products. Monitor clinically. Possibly related to sepsis. 6. Possible pulmonary infarction/necrosis 7. Submassive pulmonary embolism with right ventricular strain diagnosed 5/29. Resume Xarelto after question of liver biopsy answered.  8. Suspected metastatic prostate cancer with mediastinal adenopathy; Hepatic, nodal and osseous metastatic disease seen on CT  of the abdomen pelvis. IR recommended cytology from thoracentesis first, then liver biopsy if inconclusive. Has an outpatient appointment scheduled for liver biopsy. 9. Suspected COPD, has not yet had formal workup.  10. History of prostate cancer status post radiation 2009  11. Tobacco dependence. Recommend cessation.   Overall appears to be stable although remains significantly hypoxic. Episode this morning likely related to being off his oxygen. Plan to continue empiric antibiotics at this point, we discussed the etiology of his pain and respiratory failure being multifactorial in that infection is not unclear etiology at this point.   Followup cytology, if inconclusive, likely proceed with liver biopsy. Discussed with interventional radiology, typically Xarelto held for 24 hours preprocedure, however if INR is elevated procedure will be canceled (even though the significance of elevated INR on Xarelto is unclear). Therefore will initiate Lovenox until question answered, then Xarelto afterwards.  INR, CBC in AM  Bowel regimen.  Speech evaluation  If liver lesion biopsy performed in the future, need to send for FoundationOne testing  Physical therapy evaluation  Code Status: full code DVT prophylaxis: Lovenox Family Communication: Discussed with wife at bedside Disposition Plan: Pending  [name], MD  [facility]  Pager [phone] If 7PM-7AM, please contact night-coverage at [email or web address], password [facility] 08/19/2013, 12:05 PM  LOS: 3 days   Consultants:  Oncology   Pulmonology  Procedures:  7/11. Ultrasound guided thoracentesis with removal of 800 mL of bloody colored fluid.  Antibiotics:  Vancomycin 7/6>>   Aztreonam 7/6>>  HPI/Subjective: Status post thoracentesis 7/7. Today he became very short of breath upon using the restroom without oxygen.  Currently he feels better. Still remains short of breath with intermittent chest pain which has been ongoing  since prior to admission. Poor appetite.  Objective: Filed Vitals:   08/18/13 2337 08/19/13 0401 08/19/13 0516 08/19/13 0720  BP: 116/81  136/92   Pulse: 88  80   Temp: 98 F (36.7 C)  97.2 F (36.2 C)   TempSrc:  Oral  Oral   Resp: 18  18   Height:      Weight:      SpO2: 91% 92% 90% 91%    Intake/Output Summary (Last 24 hours) at 08/19/13 1205 Last data filed at 08/19/13 0600  Gross per 24 hour  Intake 2682.5 ml  Output   3250 ml  Net -567.5 ml     Filed Weights   08/16/13 1451  Weight: 77.111 kg (170 lb)    Exam:   Afebrile, VSS, 4L [place] SpO2 91%. Gen. Appears calm, ill but nontoxic. Psych. Alert. Grossly normal mentation. Speech fluent and clear. ENT. Hard of hearing. Cardiovascular. RRR no m/r/g, no LE edema Respiratory. CTA bilaterally anteriorally, diminished breath sounds posteriorly on right. Mild increased respiratory effort. Able to speak in full sentences.  Data Reviewed: I/O: Excellent urine output. Chemistry: Creatinine has normalized. Anion gap normal.  Heme: Leukocytosis improved, 15.5. Platelet count 101.  ID: Blood cultures no growth to date. Thoracentesis fluid culture pending. Sputum culture pending. Imaging: 7/5 CT angiogram of the chest revealed chronic small pulmonary embolus right main and lower lobe pulmonary arteries. Nonocclusive. No new pulmonary embolus. Enlarging right pleural effusion with right lower lobe consolidation. CT head unremarkable. Chest x-ray post paracentesis revealed decrease in right pleural effusion with persistent atelectasis versus consolidation right middle and right lower lobe. Tiny right apex pneumothorax.  Scheduled Meds: . antiseptic oral rinse  15 mL Mouth Rinse BID  . aztreonam  2 g Intravenous Q8H  . budesonide-formoterol  2 puff Inhalation BID  . folic acid  1 mg Oral Daily  . multivitamin with minerals  1 tablet Oral Daily  . thiamine  100 mg Oral Daily  . vancomycin  750 mg Intravenous Q12H   Continuous  Infusions: . sodium chloride 50 mL/hr at 08/18/13 2039    Active Problems:   Pulmonary embolism   Lung nodule   HCAP (healthcare-associated pneumonia)   COPD (chronic obstructive pulmonary disease)-PFT pending    Pneumonia   Time spent 35 minutes, greater than 50% in counseling and coordination of care

## 2013-08-19 NOTE — Progress Notes (Signed)
Notified by patients wife that patient had attempted to use the restroom and became very short of breath. Upon assessment RT and RN was at bedside assisting patient back to bed. Patient did not have his oxygen on and was short of breath and in a lot of pain. Patients oxygen level without oxygen was 80% at rest, pulse was 115, respirations were 22. Oxygen was then placed on patient and oxygen level was 90%. Patients bed alarm was turned on and patient was resting in bed. MD was notified. MD ordered morphine PRN for pain every four hours. Will continue to assess patients.

## 2013-08-19 NOTE — Evaluation (Signed)
Clinical/Bedside Swallow Evaluation Patient Details  Name: REIGN BARTNICK MRN: [id number] Date of Birth: 12/14/1945  Today's Date: 08/19/2013 Time: 1215-1235 SLP Time Calculation (min): 20 min  Past Medical History:  Past Medical History  Diagnosis Date  . H/O renal calculi   . Urethral stricture   . Wears hearing aid     right ear  . Hearing impaired person, right   . Arthritis   . Macular degeneration   . Ulcerative colitis   . History of kidney stones   . Pulmonary embolus 07/10/2013  . DVT, lower extremity 07/10/2013    LLE  . COPD (chronic obstructive pulmonary disease)   . Prostate cancer 10/24/07    radiation therapy  . Metastatic disease     unknown source, found on CT A/P 08/08/2013   Past Surgical History:  Past Surgical History  Procedure Laterality Date  . Knee arthroscopy  1993    right  . Appendectomy  2010    [facility], Dr. Romona Curls  . Shoulder surgery      left, for chronic dislocation  . Cystoscopy  2010    [facility], Dr. Maryland Pink  . Urethrotomy  06/12/2011    Procedure: CYSTOSCOPY/URETHROTOMY;  Surgeon: Marissa Nestle, MD;  Location: [facility];  Service: Urology;  Laterality: N/A;  optical urethrotomy  . Cataract extraction w/phaco Left 01/15/2013    Procedure: CATARACT EXTRACTION PHACO AND INTRAOCULAR LENS PLACEMENT (IOC);  Surgeon: Tonny Branch, MD;  Location: [facility];  Service: Ophthalmology;  Laterality: Left;  CDE:22.42  . Cystoscopy with urethral dilatation N/A 06/30/2013    Procedure: CYSTOSCOPY WITH URETHRAL DILATATION;  Surgeon: Marissa Nestle, MD;  Location: [facility];  Service: Urology;  Laterality: N/A;  . Rectal exam under anesthesia N/A 06/30/2013    Procedure: RECTAL EXAM UNDER ANESTHESIA;  Surgeon: Marissa Nestle, MD;  Location: [facility];  Service: Urology;  Laterality: N/A;  . Urethrotomy N/A 06/30/2013    Procedure: CYSTOSCOPY/URETHROTOMY;  Surgeon: Marissa Nestle, MD;  Location: [facility];  Service: Urology;  Laterality: N/A;  . Cystoscopy 2015     HPI:   TREVIONE WERT is a 68 y.o. male with a history of COPD, Prostate Ca S/P Rad Rx and Recent hospitalization due to CAP and Pulmonary Embolism ( now on Xarelto Rx) who presents to the ED with complaints of worsening SOB, productive Cough and Fevers and Chills for the past 2.5 weeks. He has had weakness and fatigue and loss of appetite overall and he had nausea and vomiting x 1 day of admission.   He was hypoxic in the ED on  and required 4 liters of [place] O2.  Recent CXR on 08/18/13 indicated decrease in right pleural effusion post thoracentesis, though right pleural effusion and atelectasis vs consolidation of right middle and lower lobe persists; underlying COPD with minimal left basilar atelectasis; CT head 08/17/13 with no acute intracranial abnormalities; no enhancing lesions to suggest metastatic disease indicated; pt reported decreased appetite and increased pain (right chest) during BSE Assessment / Plan / Recommendation Clinical Impression   Pt without overt s/s of aspiration with thin via straw/solid consistency; oropharyngeal swallow normal as laryngeal elevation adequate via palpation and timely swallow noted during evaluation; no indication of difficulty transitioning bolus via pharynx/esophagus and stasis was not observed post-swallow in oral cavity and did not appear to remain in pharyngeal area with throat clear/cough/multiple swallows/audible swallow not observed.  Pt appears able to tolerate regular/thin diet with general swallowing precautions; medication whole with liquids as  tolerated     Aspiration Risk  None    Diet Recommendation Regular;Thin liquid   Liquid Administration via: Cup;Straw Medication Administration: Whole meds with liquid Supervision: Patient able to self feed Compensations: Slow rate;Small sips/bites Postural Changes and/or Swallow Maneuvers: Seated upright 90 degrees;Upright 30-60 min after meal    Other  Recommendations Oral Care Recommendations: Oral care BID    Follow Up Recommendations  None    Frequency and Duration  n/a      Pertinent Vitals/Pain Pain reported 11/10; nursing informed and addressed immediately with medication    SLP Swallow Goals  n/a   Swallow Study Prior Functional Status  Lives at home with wife; reports independent with ADL's    General Date of Onset: 08/16/13 HPI: JAIS DEMIR is a 68 y.o. male with a history of COPD, Prostate Ca S/P Rad Rx and Recent hospitalization  Due to CAP and Pulmonary Embolism ( now on Xarelto Rx) who presented to the ED with complaints of worsening SOB, productive Cough and Fevers and Chills for the past 2.5 weeks. He has had weakness and fatigue and loss of appetite and He had Nausea and vomiting x 1 on    He was hypoxic in the ED and required 4 liters of [place] O2, a  CXR and CTA of the chest were performed and revealed development of a multifocal pneumonia acutely.  Type of Study: Bedside swallow evaluation Previous Swallow Assessment: n/a Diet Prior to this Study: Regular (heart healthy) Temperature Spikes Noted: No Respiratory Status: Nasal cannula Behavior/Cognition: Alert;Agitated;Distractible;Other (comment) (pain rating 11/10) Oral Cavity - Dentition: Poor condition;Missing dentition (has dentures; stated "haven't been using in hospital") Self-Feeding Abilities: Able to feed self Patient Positioning: Upright in bed Baseline Vocal Quality: Clear;Low vocal intensity Volitional Cough: Strong Volitional Swallow: Able to elicit    Oral/Motor/Sensory Function Overall Oral Motor/Sensory Function: Appears within functional limits for tasks assessed   Ice Chips Ice chips: Not tested   Thin Liquid Thin Liquid: Within functional limits Presentation: Cup;Straw    Nectar Thick Nectar Thick Liquid: Not tested   Honey Thick Honey Thick Liquid: Not tested   Puree Puree: Not tested   Solid       Solid: Within functional limits Presentation: Self Fed       Paschal Blanton,PAT, M.S.,  CCC-SLP 08/19/2013,1:06 PM

## 2013-08-19 NOTE — Progress Notes (Signed)
ANTICOAGULATION CONSULT NOTE - Initial Consult  Pharmacy Consult for Lovenox Indication: H/O PE & DVT (was on Xarelto PTA)  No Known Allergies  Patient Measurements: Height: 6\' 1"  (185.4 cm) Weight: 170 lb (77.111 kg) IBW/kg (Calculated) : 79.9  Vital Signs: Temp: 97.2 F (36.2 C) (07/08 0516) Temp src: Oral (07/08 0516) BP: 136/92 mmHg (07/08 0516) Pulse Rate: 80 (07/08 0516)  Labs:  Recent Labs  08/16/13 1509 08/17/13 0517 08/17/13 1238 08/18/13 0521 08/19/13 0544  HGB 16.9 14.8  --  13.4 14.4  HCT 51.1 44.1  --  40.5 44.6  PLT 159 133*  --  160 101*  LABPROT  --   --  22.9*  --   --   INR  --   --  2.02*  --   --   CREATININE 1.29 1.19  --  0.99 1.08  TROPONINI <0.30  --   --   --   --     Estimated Creatinine Clearance: 70.4 ml/min (by C-G formula based on Cr of 1.08).  Medical History: Past Medical History  Diagnosis Date  . H/O renal calculi   . Urethral stricture   . Wears hearing aid     right ear  . Hearing impaired person, right   . Arthritis   . Macular degeneration   . Ulcerative colitis   . History of kidney stones   . Pulmonary embolus 07/10/2013  . DVT, lower extremity 07/10/2013    LLE  . COPD (chronic obstructive pulmonary disease)   . Prostate cancer 10/24/07    radiation therapy  . Metastatic disease     unknown source, found on CT A/P 08/08/2013   Medications:  Prescriptions prior to admission  Medication Sig Dispense Refill  . albuterol (PROAIR HFA) 108 (90 BASE) MCG/ACT inhaler Inhale 2 puffs into the lungs every 6 (six) hours as needed for wheezing or shortness of breath.      Marland Kitchen amoxicillin-clavulanate (AUGMENTIN) 875-125 MG per tablet Take 1 tablet by mouth every 12 (twelve) hours. Takes for 7 days.  Dosing began 08/09/2013.      . budesonide-formoterol (SYMBICORT) 160-4.5 MCG/ACT inhaler Inhale 2 puffs into the lungs 2 (two) times daily.  1 Inhaler  6  . rivaroxaban (XARELTO) 20 MG TABS tablet Take 1 tablet (20 mg total) by mouth  daily with supper.  30 tablet  3   Assessment: 68yo male with h/o PE and DVT who was on Xarelto PTA.  Pt may need liver biopsy and Xarelto stopped.  Asked to bridge with Lovenox full dose until question of liver bx resolved. Pt has good renal fxn.   Platelets are trending down during admission.  Goal of Therapy:  Anti-Xa level 0.6-1 units/ml 4hrs after LMWH dose given Monitor platelets by anticoagulation protocol: Yes   Plan: Lovenox 80mg  SQ q12hrs (1mg /Kg per dose) Monitor renal fxn and platelets CBC tomorrow am  Hart Robinsons A 08/19/2013,1:18 PM

## 2013-08-19 NOTE — Progress Notes (Signed)
Patient ID: KALIQ LEGE, male   DOB: Jul 02, 1945, 68 y.o.   MRN: [id number]    Request has been made for possible liver biopsy  Awaiting cyto results from Thoracentesis performed 7/7 before moving forward.  Noted INR is 2.02 7/6 Must be 1.5 or lower to safely proceed with liver biopsy if needed.  Will continue to monitor chart for cyto on thora

## 2013-08-20 LAB — PROTIME-INR
INR: 1.24 (ref 0.00–1.49)
PROTHROMBIN TIME: 15.6 s — AB (ref 11.6–15.2)

## 2013-08-20 LAB — CULTURE, RESPIRATORY: CULTURE: NORMAL

## 2013-08-20 LAB — CBC
HCT: 45.3 % (ref 39.0–52.0)
Hemoglobin: 14.5 g/dL (ref 13.0–17.0)
MCH: 28.7 pg (ref 26.0–34.0)
MCHC: 32 g/dL (ref 30.0–36.0)
MCV: 89.7 fL (ref 78.0–100.0)
PLATELETS: 137 10*3/uL — AB (ref 150–400)
RBC: 5.05 MIL/uL (ref 4.22–5.81)
RDW: 15.9 % — ABNORMAL HIGH (ref 11.5–15.5)
WBC: 15.8 10*3/uL — ABNORMAL HIGH (ref 4.0–10.5)

## 2013-08-20 LAB — VANCOMYCIN, TROUGH: VANCOMYCIN TR: 18.9 ug/mL (ref 10.0–20.0)

## 2013-08-20 LAB — OCCULT BLOOD X 1 CARD TO LAB, STOOL: Fecal Occult Bld: NEGATIVE

## 2013-08-20 NOTE — Progress Notes (Signed)
He had swallowing evaluation yesterday which was normal. We're still awaiting the cytology on his thoracentesis. He seems to generally be improving.

## 2013-08-20 NOTE — Progress Notes (Signed)
PROGRESS NOTE  Jonathan Goodwin JAS:[id number] DOB: 12-15-1945 DOA: 08/16/2013 PCP: Jonathan Faster, FNP  Summary: 68 year old man diagnosed with submassive pulmonary embolism with right ventricular strain, left lower extremity DVT, possible healthcare associated pneumonia 5/29, treated Jonathan Goodwin who was rehospitalized 6/24-6/28 for suspected recurrent pneumonia. Imaging at that time suggested hepatic metastatic disease. On discharge plans were made for outpatient liver biopsy to further evaluate.  He presented to the emergency Department 7/5 with fever, recurrent cough and shortness of breath and was admitted for treatment of healthcare associated pneumonia and pleural effusion. He was seen by oncology and recommendations were to obtain thoracentesis, some fluid for cytology and obtain ultrasound-guided biopsy of the liver at a later date if nondiagnostic.  Assessment/Plan: 1. Acute hypoxic respiratory failure. Stable on 4 L. 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 2. Healthcare associated pneumonia with sepsis on admission. Legionella and strep pneumonia antigen negative. Blood cultures no growth today. Leukocytosis improving. Sputum culture normal flora. Pleural fluid culture pending. 3. Right pleural effusion, exudative. Status post thoracentesis 7/7. Culture and cytology pending. 4. Tiny right apical pneumothorax post procedure thoracentesis. Management per pulmonology. Appears to be asymptomatic. 5. Thrombocytopenia. Improve. Suspect related to infection. Has not received any heparin products. Monitor clinically.  6. Possible pulmonary infarction/necrosis 7. Submassive pulmonary embolism with right ventricular strain diagnosed 5/29. Resume Xarelto after question of liver biopsy answered. Remained stable. 8. 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 IR recommended cytology from thoracentesis first, then liver biopsy if inconclusive.  9. Suspected COPD, has not yet had formal workup.  10. History of prostate cancer status post radiation 2009  11. Tobacco dependence. Recommend cessation.   Overall somewhat better today. Continue empiric antibiotics, followup pleural fluid culture which is currently going gram-negative rods.  Await cytology results.  Continue bowel regimen.  If liver lesion biopsy performed in the future, need to send for [employer] testing  Code Status: full code DVT prophylaxis: Lovenox Family Communication: Discussed with wife at bedside Disposition Plan: Pending  [name], MD  [facility]  Pager [phone] If 7PM-7AM, please contact night-coverage at [email or web address], password [email or web address] 08/20/2013, 2:30 PM  LOS: 4 days   Consultants:  Oncology   Pulmonology  Speech therapy: Regular, thin liquid  Physical therapy: home health.  Procedures:  7/11. Ultrasound guided thoracentesis with removal of 800 mL of bloody colored fluid.  Antibiotics:  Vancomycin 7/6>>   Aztreonam 7/6>>  HPI/Subjective: Feeling somewhat better today. Still has some shortness of breath. Little pain.  Objective: Filed Vitals:   08/20/13 0525 08/20/13 0702 08/20/13 0928 08/20/13 1412  BP: 126/89   113/82  Pulse: 92   87  Temp: 97.9 F (36.6 C)   98 F (36.7 C)  TempSrc: Oral     Resp: 18   20  Height:      Weight:      SpO2: 92% 91% 90% 91%    Intake/Output Summary (Last 24 hours) at 08/20/13 1430 Last data filed at 08/20/13 1413  Gross per 24 hour  Intake   1240 ml  Output   1700 ml  Net   -460 ml     Filed Weights   08/16/13 1451  Weight: 77.111 kg (170 lb)    Exam:  Afebrile, stable hypoxia. Vitals stable. Gen. Appears calm, more comfortable today. Psych. Alert. Grossly normal  mood  and affect. Speech  Cardiovascular.  Regular rate and rhythm. No murmur, rub or gallop.no lower extremity edema.  Respiratory.  clear to auscultation bilaterally. No wheezes, rales or rhonchi. Normal respiratory effort.   Data Reviewed: I/O: Adequate urine output. Heme: Platelet count improved, 137. Leukocytosis without change, 15.8. INR 1.24. ID: Pleural fluid growing gram-negative rods. Sputum culture normal oropharyngeal flora. Blood cultures no growth today.  Scheduled Meds: . antiseptic oral rinse  15 mL Mouth Rinse BID  . aztreonam  2 g Intravenous Q8H  . budesonide-formoterol  2 puff Inhalation BID  . enoxaparin (LOVENOX) injection  80 mg Subcutaneous Q12H  . folic acid  1 mg Oral Daily  . multivitamin with minerals  1 tablet Oral Daily  . polyethylene glycol  17 g Oral BID  . thiamine  100 mg Oral Daily  . vancomycin  750 mg Intravenous Q12H   Continuous Infusions:    Active Problems:   Pulmonary embolism   Lung nodule   HCAP (healthcare-associated pneumonia)   COPD (chronic obstructive pulmonary disease)-PFT pending    Pneumonia   Time spent 20 minutes

## 2013-08-20 NOTE — Evaluation (Signed)
Physical Therapy Evaluation Patient Details Name: Jonathan Goodwin MRN: [id number] DOB: Aug 21, 1945 Today's Date: 08/20/2013   History of Present Illness  Jonathan Goodwin is a 68 y.o. male with recurring bouts of pneumonia presents to the ED after a recent discharge for the same. Patient sates that he has noted increased cough and shortness of breath. He states he cannot lay down. He states that whenever he comes in for IV antibiotics he appears to improve but when he is switched to oral treatment and sent home his symptoms seem to get worse. Patient states that he has noted wheeze. He also states that he has had prostate cancer being followed by urology. Recently he was noted to have disease in the liver and is actually scheduled for a biopsy. He states that he is also to see oncology for the lung and chest mets noted on the scan.  Pt had a recent hospitalization from 6/24-6/28/15 for same condition.  During this hospitalization, the pt underwent a thoracentesis on 08/18/13.    Clinical Impression  Pt is a 68 year old male who presents to physical therapy for assessment of functional mobility skills.  Pt was recently hospitalized from 6/24-6/28 secondary to SOB.  Pt required encouragement to participate in PT evaluation, secondary to complaints of pain, SOB, didn't want to get out of bed, didn't want to wear gait belt, etc.  Educated pt and family of MD orders for PT evaluation, and reasons gait belt and PT evaluation was being used/performed.  Pt agreed to PT evaluation after being able to apply gait belt himself; no restrictions from PT for pt to apply gait belt to himself.  Pt lives in a mobile home with his wife with 4-5 steps with (B) handrails to enter; wife available 24/7 to assist as needed.  Pt reports he was mod (I) with bed mobility skills, transfers, and household amb as a furniture walker when needed.  Pt does report preference for use of W/C for longer distances secondary to SOB.  During evaluation, pt  was mod (I) with bed mobility skills, supervision with transfers, and short distance amb with use of RW and min guard.  Amb distance limited by SOB and decrease in O2 stats to 90%; seated rest break of 2 minutes to increase stats to 94%.  Pt on 4L O2 during evaluation; pt reports he does not use O2 at home.  Recommend continued PT while in the hospital to address strengthening, balance, and activity tolerance for improvement of functional mobility skills with transition to HHPT services at discharge.  Recommend use of rollator for safe ambulation skills.      Follow Up Recommendations Home health PT    Equipment Recommendations   (Rollator)       Precautions / Restrictions Precautions Precautions: Fall Restrictions Weight Bearing Restrictions: No      Mobility  Bed Mobility Overal bed mobility: Modified Independent             General bed mobility comments: HOB slightly raised secondary to SOB  Transfers Overall transfer level: Needs assistance Equipment used: Rolling walker (2 wheeled) Transfers: Sit to/from Stand Sit to Stand: Supervision            Ambulation/Gait Ambulation/Gait assistance: Min guard Ambulation Distance (Feet): 8 Feet Assistive device: Rolling walker (2 wheeled) Gait Pattern/deviations: Step-through pattern;Decreased step length - right;Decreased step length - left   Gait velocity interpretation: Below normal speed for age/gender General Gait Details: Noted decreased O2 stats to 90% after  amb, pt required 2 minute seated rest break for O2 levels to increase to 94%      Balance Overall balance assessment: Needs assistance Sitting-balance support: Feet supported;Single extremity supported Sitting balance-Leahy Scale: Good     Standing balance support: Bilateral upper extremity supported;During functional activity (On RW with min guard) Standing balance-Leahy Scale: Fair                               Pertinent Vitals/Pain Pt  reports moderate/severe pain levels in Rt abdomen/rib cage area.  RN made aware and brought pain meds at end of PT eval.     Home Living Family/patient expects to be discharged to:: Private residence Living Arrangements: Spouse/significant other Available Help at Discharge: Available 24 hours/day Type of Home: Mobile home Home Access: Stairs to enter Entrance Stairs-Rails: Can reach both Entrance Stairs-Number of Steps: 4-5 Home Layout: One level Home Equipment: Shower seat Additional Comments: Tub shower    Prior Function Level of Independence: Needs assistance   Gait / Transfers Assistance Needed: Pt is mod (I) with bed mobility skills, transfers.  Pt reports he is a furniture walker when needed for short distances.  For further distances, pt uses a W/C if available or holds onto wife arm if needed.               Extremity/Trunk Assessment               Lower Extremity Assessment: Generalized weakness         Communication   Communication: No difficulties  Cognition Arousal/Alertness: Awake/alert Behavior During Therapy: Agitated (Pain, doesn't want to participate due to SOB) Overall Cognitive Status: Within Functional Limits for tasks assessed                               Assessment/Plan    PT Assessment Patient needs continued PT services  PT Diagnosis Difficulty walking;Generalized weakness   PT Problem List Decreased strength;Decreased knowledge of use of DME;Decreased activity tolerance;Decreased balance;Decreased mobility;Cardiopulmonary status limiting activity  PT Treatment Interventions Balance training;Gait training;Neuromuscular re-education;Functional mobility training;Therapeutic activities;Therapeutic exercise;Stair training   PT Goals (Current goals can be found in the Care Plan section) Acute Rehab PT Goals Patient Stated Goal: decrease pain in Rt side PT Goal Formulation: With patient Time For Goal Achievement:  09/03/13 Potential to Achieve Goals: Good    Frequency Min 3X/week    End of Session Equipment Utilized During Treatment: Gait belt;Oxygen (O2 at 4L during evaluation, pt reports he does not use O2 at home) Activity Tolerance: Treatment limited secondary to medical complications (Comment) (Limited by SOB) Patient left: in bed;with call bell/phone within reach;with family/visitor present Nurse Communication: Patient requests pain meds         Time: 9163-8466 PT Time Calculation (min): 25 min   Charges:   PT Evaluation $Initial PT Evaluation Tier I: 1 Procedure      Ondine Gemme 08/20/2013, 9:34 AM

## 2013-08-20 NOTE — Progress Notes (Signed)
ANTIBIOTIC CONSULT NOTE - follow up  Pharmacy Consult for Vancomycin & Aztreonam Indication: pneumonia  No Known Allergies  Patient Measurements: Height: 6\' 1"  (185.4 cm) Weight: 170 lb (77.111 kg) IBW/kg (Calculated) : 79.9  Vital Signs: Temp: 97.9 F (36.6 C) (07/09 0525) Temp src: Oral (07/09 0525) BP: 126/89 mmHg (07/09 0525) Pulse Rate: 92 (07/09 0525) Intake/Output from previous day: 07/08 0701 - 07/09 0700 In: 1240 [P.O.:940; IV Piggyback:300] Out: 1650 [Urine:1650] Intake/Output from this shift: Total I/O In: -  Out: 200 [Urine:200]  Labs:  Recent Labs  08/18/13 0521 08/19/13 0544 08/20/13 0541  WBC 22.8* 15.5* 15.8*  HGB 13.4 14.4 14.5  PLT 160 101* 137*  CREATININE 0.99 1.08  --    Estimated Creatinine Clearance: 70.4 ml/min (by C-G formula based on Cr of 1.08).  Recent Labs  08/20/13 0541  VANCOTROUGH 18.9    Microbiology: Recent Results (from the past 720 hour(s))  CULTURE, BLOOD (ROUTINE X 2)     Status: None   Collection Time    08/05/13  7:55 PM      Result Value Ref Range Status   Specimen Description BLOOD LEFT ANTECUBITAL   Final   Special Requests BOTTLES DRAWN AEROBIC AND ANAEROBIC 6CC   Final   Culture NO GROWTH 5 DAYS   Final   Report Status 08/10/2013 FINAL   Final  CULTURE, BLOOD (ROUTINE X 2)     Status: None   Collection Time    08/05/13  8:02 PM      Result Value Ref Range Status   Specimen Description BLOOD LEFT HAND   Final   Special Requests BOTTLES DRAWN AEROBIC AND ANAEROBIC 6CC   Final   Culture NO GROWTH 5 DAYS   Final   Report Status 08/10/2013 FINAL   Final  MRSA PCR SCREENING     Status: None   Collection Time    08/06/13  5:18 PM      Result Value Ref Range Status   MRSA by PCR NEGATIVE  NEGATIVE Final   Comment:            The GeneXpert MRSA Assay (FDA     approved for NASAL specimens     only), is one component of a     comprehensive MRSA colonization     surveillance program. It is not     intended to  diagnose MRSA     infection nor to guide or     monitor treatment for     MRSA infections.  CULTURE, BLOOD (ROUTINE X 2)     Status: None   Collection Time    08/16/13  9:19 PM      Result Value Ref Range Status   Specimen Description RIGHT ANTECUBITAL   Final   Special Requests BOTTLES DRAWN AEROBIC AND ANAEROBIC 12CC   Final   Culture NO GROWTH 3 DAYS   Final   Report Status PENDING   Incomplete  CULTURE, BLOOD (ROUTINE X 2)     Status: None   Collection Time    08/16/13  9:19 PM      Result Value Ref Range Status   Specimen Description LEFT ANTECUBITAL   Final   Special Requests BOTTLES DRAWN AEROBIC AND ANAEROBIC 8CC   Final   Culture NO GROWTH 3 DAYS   Final   Report Status PENDING   Incomplete  CULTURE, EXPECTORATED SPUTUM-ASSESSMENT     Status: None   Collection Time    08/17/13  8:48 PM  Result Value Ref Range Status   Specimen Description SPUTUM   Final   Special Requests NONE   Final   Sputum evaluation     Final   Value: THIS SPECIMEN IS ACCEPTABLE. RESPIRATORY CULTURE REPORT TO FOLLOW.     PERFORMED AT [facility]   Report Status 08/17/2013 FINAL   Final  CULTURE, RESPIRATORY (NON-EXPECTORATED)     Status: None   Collection Time    08/17/13  8:48 PM      Result Value Ref Range Status   Specimen Description SPUTUM   Final   Special Requests NONE   Final   Gram Stain     Final   Value: RARE WBC PRESENT, PREDOMINANTLY MONONUCLEAR     FEW SQUAMOUS EPITHELIAL CELLS PRESENT     MODERATE GRAM POSITIVE COCCI IN PAIRS     IN CLUSTERS FEW GRAM POSITIVE RODS     FEW GRAM NEGATIVE RODS   Culture     Final   Value: NORMAL OROPHARYNGEAL FLORA     Performed at [facility]   Report Status 08/20/2013 FINAL   Final  BODY FLUID CULTURE     Status: None   Collection Time    08/18/13  3:30 PM      Result Value Ref Range Status   Specimen Description PLEURAL   Final   Special Requests Immunocompromised   Final   Gram Stain     Final   Value: RARE WBC PRESENT,  PREDOMINANTLY PMN     NO ORGANISMS SEEN     Performed at [facility]   Culture     Final   Value: NO GROWTH     Performed at [facility]   Report Status PENDING   Incomplete   Medical History: Past Medical History  Diagnosis Date  . H/O renal calculi   . Urethral stricture   . Wears hearing aid     right ear  . Hearing impaired person, right   . Arthritis   . Macular degeneration   . Ulcerative colitis   . History of kidney stones   . Pulmonary embolus 07/10/2013  . DVT, lower extremity 07/10/2013    LLE  . COPD (chronic obstructive pulmonary disease)   . Prostate cancer 10/24/07    radiation therapy  . Metastatic disease     unknown source, found on CT A/P 08/08/2013   Medications:  Scheduled:  . antiseptic oral rinse  15 mL Mouth Rinse BID  . aztreonam  2 g Intravenous Q8H  . budesonide-formoterol  2 puff Inhalation BID  . enoxaparin (LOVENOX) injection  80 mg Subcutaneous Q12H  . folic acid  1 mg Oral Daily  . multivitamin with minerals  1 tablet Oral Daily  . polyethylene glycol  17 g Oral BID  . thiamine  100 mg Oral Daily  . vancomycin  750 mg Intravenous Q12H   Assessment: 68 yo M with recurrent PNA.  He has been hospitalized recently for PNA so plan to treat with empiric, broad-spectrum antibiotics for HCAP.  WBC is elevated & stable.  Patient was febrile on admission but fever has resolved.  CXR + PNA. Renal fxn is stable.  Estimated Creatinine Clearance: 70.4 ml/min (by C-G formula based on Cr of 1.08). Trough level is on target.  Vancomycin 7/6>> Aztreonam 7/6>>  Goal of Therapy:  Vancomycin trough level 15-20 mcg/ml  Plan:  Continue Aztreonam Continue Vancomycin 750mg  IV q12h Check Vancomycin trough level weekly or sooner if indicated  Monitor renal function and cx data  Deescalate antibiotics when appropriate  Hart Robinsons A 08/20/2013,10:19 AM

## 2013-08-20 NOTE — Progress Notes (Signed)
ANTICOAGULATION CONSULT NOTE - follow up  Pharmacy Consult for Lovenox Indication: H/O PE & DVT (was on Xarelto PTA)  No Known Allergies  Patient Measurements: Height: 6\' 1"  (185.4 cm) Weight: 170 lb (77.111 kg) IBW/kg (Calculated) : 79.9  Vital Signs: Temp: 97.9 F (36.6 C) (07/09 0525) Temp src: Oral (07/09 0525) BP: 126/89 mmHg (07/09 0525) Pulse Rate: 92 (07/09 0525)  Labs:  Recent Labs  08/17/13 1238  08/18/13 0521 08/19/13 0544 08/20/13 0541  HGB  --   < > 13.4 14.4 14.5  HCT  --   --  40.5 44.6 45.3  PLT  --   --  160 101* 137*  LABPROT 22.9*  --   --   --  15.6*  INR 2.02*  --   --   --  1.24  CREATININE  --   --  0.99 1.08  --   < > = values in this interval not displayed.  Estimated Creatinine Clearance: 70.4 ml/min (by C-G formula based on Cr of 1.08).  Medical History: Past Medical History  Diagnosis Date  . H/O renal calculi   . Urethral stricture   . Wears hearing aid     right ear  . Hearing impaired person, right   . Arthritis   . Macular degeneration   . Ulcerative colitis   . History of kidney stones   . Pulmonary embolus 07/10/2013  . DVT, lower extremity 07/10/2013    LLE  . COPD (chronic obstructive pulmonary disease)   . Prostate cancer 10/24/07    radiation therapy  . Metastatic disease     unknown source, found on CT A/P 08/08/2013   Medications:  Prescriptions prior to admission  Medication Sig Dispense Refill  . albuterol (PROAIR HFA) 108 (90 BASE) MCG/ACT inhaler Inhale 2 puffs into the lungs every 6 (six) hours as needed for wheezing or shortness of breath.      Marland Kitchen amoxicillin-clavulanate (AUGMENTIN) 875-125 MG per tablet Take 1 tablet by mouth every 12 (twelve) hours. Takes for 7 days.  Dosing began 08/09/2013.      . budesonide-formoterol (SYMBICORT) 160-4.5 MCG/ACT inhaler Inhale 2 puffs into the lungs 2 (two) times daily.  1 Inhaler  6  . rivaroxaban (XARELTO) 20 MG TABS tablet Take 1 tablet (20 mg total) by mouth daily with  supper.  30 tablet  3   Assessment: 68yo male with h/o PE and DVT who was on Xarelto PTA.  Pt may need liver biopsy and Xarelto stopped.  Asked to bridge with Lovenox full dose until question of liver bx resolved. Pt has good renal fxn.   Platelets are better today.  Goal of Therapy:  Anti-Xa level 0.6-1 units/ml 4hrs after LMWH dose given Monitor platelets by anticoagulation protocol: Yes   Plan: Lovenox 80mg  SQ q12hrs (1mg /Kg per dose) Monitor renal fxn and platelets  [place] Crane, Sahand Gosch A 08/20/2013,10:22 AM

## 2013-08-21 DIAGNOSIS — C3490 Malignant neoplasm of unspecified part of unspecified bronchus or lung: Secondary | ICD-10-CM

## 2013-08-21 DIAGNOSIS — C3491 Malignant neoplasm of unspecified part of right bronchus or lung: Secondary | ICD-10-CM

## 2013-08-21 HISTORY — DX: Malignant neoplasm of unspecified part of right bronchus or lung: C34.91

## 2013-08-21 LAB — BASIC METABOLIC PANEL
ANION GAP: 12 (ref 5–15)
BUN: 27 mg/dL — ABNORMAL HIGH (ref 6–23)
CO2: 22 meq/L (ref 19–32)
Calcium: 8.3 mg/dL — ABNORMAL LOW (ref 8.4–10.5)
Chloride: 103 mEq/L (ref 96–112)
Creatinine, Ser: 1.09 mg/dL (ref 0.50–1.35)
GFR calc Af Amer: 78 mL/min — ABNORMAL LOW (ref >90)
GFR, EST NON AFRICAN AMERICAN: 67 mL/min — AB (ref >90)
Glucose, Bld: 114 mg/dL — ABNORMAL HIGH (ref 70–99)
Potassium: 4.9 mEq/L (ref 3.7–5.3)
Sodium: 137 mEq/L (ref 137–147)

## 2013-08-21 LAB — CULTURE, BLOOD (ROUTINE X 2)
CULTURE: NO GROWTH
CULTURE: NO GROWTH

## 2013-08-21 LAB — MRSA PCR SCREENING: MRSA by PCR: NEGATIVE

## 2013-08-21 NOTE — Progress Notes (Signed)
PROGRESS NOTE  Jonathan Goodwin PXT:[id number] DOB: January 12, 1946 DOA: 08/16/2013 PCP: [name], FNP  Summary: 68 year old man diagnosed with submassive pulmonary embolism with right ventricular strain, left lower extremity DVT, possible healthcare associated pneumonia 5/29, treated [name] who was rehospitalized 6/24-6/28 for suspected recurrent pneumonia. Imaging at that time suggested hepatic metastatic disease. On discharge plans were made for outpatient liver biopsy to further evaluate.  He presented to the emergency Department 7/5 with fever, recurrent cough and shortness of breath and was admitted for treatment of healthcare associated pneumonia and pleural effusion. He was seen by oncology and recommendations were to obtain thoracentesis, some fluid for cytology and obtain ultrasound-guided biopsy of the liver at a later date if nondiagnostic.  Assessment/Plan: 1. Acute hypoxic respiratory failure. Stable on 4 L. Multifactorial, newly diagnosed lung cancer, submassive pulmonary embolism, suspected COPD, healthcare associated pneumonia. 2. 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 Healthcare associated pneumonia with sepsis on admission. Await final cultures. 4. Right pleural effusion, exudative. Status post thoracentesis 7/7. Culture pending. 5. Tiny right apical pneumothorax post procedure thoracentesis. Management per pulmonology. Appears to be asymptomatic. 6. Thrombocytopenia. Was improving yesterday. Likely related to infection. 7. Possible pulmonary infarction/necrosis 8. Submassive pulmonary embolism with right ventricular strain diagnosed 5/29. Resume Xarelto after question of liver biopsy answered. If to some of the week and, plan outpatient. 9. Suspected COPD, has not yet had formal workup.  10. Tobacco dependence. Recommend cessation.   Slowly improving. Remains afebrile. Continue empiric antibiotics pending final  culture results.  Discussed with oncology, at some point later biopsy recommended but can be pursued as an outpatient.  Continue bowel regimen.  If liver lesion biopsy performed in the future, need to send for [employer] testing  Discussed in detail with wife at bedside, hopefully in the next 2-3 days.  Code Status: full code DVT prophylaxis: Lovenox Family Communication: Discussed with wife at bedside Disposition Plan: Pending  [name], MD  [facility]  Pager [phone] If 7PM-7AM, please contact night-coverage at [email or web address], password [facility] 08/21/2013, 4:43 PM  LOS: 5 days   Consultants:  Oncology   Pulmonology  Speech therapy: Regular, thin liquid  Physical therapy: home health.  Procedures:  7/11. Ultrasound guided thoracentesis with removal of 800 mL of bloody colored fluid.  Antibiotics:  Vancomycin 7/6>>   Aztreonam 7/6>>  HPI/Subjective: Breathing better. Less pain. Still quite short of breath with ambulation.  Objective: Filed Vitals:   08/20/13 1917 08/20/13 2253 08/21/13 0614 08/21/13 0718  BP:  109/78 113/73   Pulse:  83 84   Temp:  98.7 F (37.1 C) 97 F (36.1 C)   TempSrc:  Oral Oral   Resp:  20 20   Height:      Weight:      SpO2: 92% 91% 94% 93%    Intake/Output Summary (Last 24 hours) at 08/21/13 1643 Last data filed at 08/21/13 1300  Gross per 24 hour  Intake   1700 ml  Output   1475 ml  Net    225 ml     Filed Weights   08/16/13 1451  Weight: 77.111 kg (170 lb)    Exam:  Afebrile, vital signs are stable. Stable hypoxia on 4 L. Gen. Appears comfortable, calm. Psych. Alert. Speech fluent and clear. Cardiovascular. Regular rate and rhythm. No murmur, rub or gallop. Respiratory. Clear to auscultation bilaterally. No wheezes, rales or rhonchi. Normal respiratory effort.  Data Reviewed: I/O: Adequate urine  output. Chemistry: Basic metabolic panel unremarkable. ID: Pleural fluid growing gram-negative rods  and MRSA. Sputum culture normal oropharyngeal flora. Blood cultures no growth, final.  Scheduled Meds: . antiseptic oral rinse  15 mL Mouth Rinse BID  . aztreonam  2 g Intravenous Q8H  . budesonide-formoterol  2 puff Inhalation BID  . enoxaparin (LOVENOX) injection  80 mg Subcutaneous Q12H  . folic acid  1 mg Oral Daily  . multivitamin with minerals  1 tablet Oral Daily  . polyethylene glycol  17 g Oral BID  . thiamine  100 mg Oral Daily  . vancomycin  750 mg Intravenous Q12H   Continuous Infusions:    Active Problems:   Pulmonary embolism   Lung nodule   HCAP (healthcare-associated pneumonia)   COPD (chronic obstructive pulmonary disease)-PFT pending    Pneumonia   Time spent 20 minutes

## 2013-08-21 NOTE — Progress Notes (Signed)
Subjective: Patient is seen in bed.  I personally reviewed and went over pathology results with the patient.  His thoracentesis reveals adenocarcinoma of lung.  I suspect this is his primary malignancy with metastatic disease to liver.  I called pathology to request EGFR and ALK testing, but Dr. Li reports that there is not enough tissue for this testing due to the amount of inflammation present in the cell block.  Therefore, we would beenfit from a hepatic lesion biopsy by IR for ALK and EGFR testing.  The patient was educated regarding his Stage IV NSCLC and the fact that with the data available today, he is incurable, but his cancer is treatable.  I have offered him Carboplatin/Pemetrexed + Avastin every 21 days.  We discussed the risks, benefits, alternatives (including comfort care), and side effects of therapy.  He is agreeable to pursue this regimen.  I quoted him less than 6 months of life without treatment and the same amount of treatment fails.    Objective: Vital signs in last 24 hours: Temp:  [97 F (36.1 C)-98.7 F (37.1 C)] 97 F (36.1 C) (07/10 0614) Pulse Rate:  [83-87] 84 (07/10 0614) Resp:  [20] 20 (07/10 0614) BP: (109-113)/(73-82) 113/73 mmHg (07/10 0614) SpO2:  [91 %-94 %] 93 % (07/10 0718)  Intake/Output from previous day: 07/09 0800 - 07/10 0759 In: 1270 [P.O.:720; I.V.:100; IV Piggyback:450] Out: 1800 [Urine:1800] Intake/Output this shift: Total I/O In: 480 [P.O.:480] Out: 475 [Urine:475]  General appearance: alert, appears older than stated age, mild distress and [place] in place  Lab Results:   Recent Labs  08/19/13 0544 08/20/13 0541  WBC 15.5* 15.8*  HGB 14.4 14.5  HCT 44.6 45.3  PLT 101* 137*   BMET  Recent Labs  08/19/13 0544 08/21/13 0504  NA 141 137  K 5.2 4.9  CL 106 103  CO2 23 22  GLUCOSE 113* 114*  BUN 25* 27*  CREATININE 1.08 1.09  CALCIUM 8.4 8.3*    Studies/Results: No results found.  Medications: I have reviewed the  patient's current medications.  Assessment/Plan: 1. Stage IV NSCLC with a thoracentesis on 7/7 demonstrating an adenocarcinoma of lung primary (negative prostate specific testing).  ALK and EGFR testing not able to be performed on thoracentesis sample. 2.  Pain 3. PE, improving on recent CT angio of chest, on Xarelto. Xarelto on hold for thoracentesis. Recommend restarting Xarelto at 20 mg daily following future interventions.  Presently on Lovenox.  4. H/O prostate cancer in 2009, managed by Dr. Krishnan and treated with radiation by Dr. Kinard at [facility], no systemic therapy ever administered.  5. COPD  6. Odd affect, CT head is negative for any metastatic disease.  7. Depending on hospital course, he would benefit from a liver biopsy of hepatic lesions for ALK and EGFR testing.  He would also benefit from inpatient insertion of port-a-cath if reasonable. 8. Will follow along as an inpatient and following discharge, he will undergo chemotherapy teaching and then Carboplatin/Pemetrexed + Avastin every 21 days.  Patient and plan discussed with Dr. Gregory Formanek and he is in agreement with the aforementioned.     LOS: 5 days    KEFALAS,THOMAS 08/21/2013    

## 2013-08-21 NOTE — Progress Notes (Signed)
He says he feels okay. He has no new complaints. He is still coughing some. We are still awaiting cytology. The cytology is positive we may not have to do any further workup. If negative I guess you will need liver biopsy

## 2013-08-21 NOTE — Progress Notes (Signed)
**Note Jonathan-Identified via Obfuscation** Patient ID: DEMORRIS Goodwin, male   DOB: 06/03/1945, 68 y.o.   MRN: [id number]   Spoke with Dr Sarajane Jews Will probably still move forward with liver biopsy "at some point" for this patient  May be while inpatient; possibly as OP  Please re order when ready to go ahead. For now, I have taken off IR list.

## 2013-08-21 NOTE — Progress Notes (Signed)
ANTICOAGULATION CONSULT NOTE - follow up  Pharmacy Consult for Lovenox Indication: H/O PE & DVT (was on Xarelto PTA)  No Known Allergies  Patient Measurements: Height: 6\' 1"  (185.4 cm) Weight: 170 lb (77.111 kg) IBW/kg (Calculated) : 79.9  Vital Signs: Temp: 97 F (36.1 C) (07/10 0614) Temp src: Oral (07/10 0614) BP: 113/73 mmHg (07/10 0614) Pulse Rate: 84 (07/10 0614)  Labs:  Recent Labs  08/19/13 0544 08/20/13 0541 08/21/13 0504  HGB 14.4 14.5  --   HCT 44.6 45.3  --   PLT 101* 137*  --   LABPROT  --  15.6*  --   INR  --  1.24  --   CREATININE 1.08  --  1.09   Estimated Creatinine Clearance: 69.8 ml/min (by C-G formula based on Cr of 1.09).  Medical History: Past Medical History  Diagnosis Date  . H/O renal calculi   . Urethral stricture   . Wears hearing aid     right ear  . Hearing impaired person, right   . Arthritis   . Macular degeneration   . Ulcerative colitis   . History of kidney stones   . Pulmonary embolus 07/10/2013  . DVT, lower extremity 07/10/2013    LLE  . COPD (chronic obstructive pulmonary disease)   . Prostate cancer 10/24/07    radiation therapy  . Metastatic disease     unknown source, found on CT A/P 08/08/2013   Medications:  Prescriptions prior to admission  Medication Sig Dispense Refill  . albuterol (PROAIR HFA) 108 (90 BASE) MCG/ACT inhaler Inhale 2 puffs into the lungs every 6 (six) hours as needed for wheezing or shortness of breath.      Marland Kitchen amoxicillin-clavulanate (AUGMENTIN) 875-125 MG per tablet Take 1 tablet by mouth every 12 (twelve) hours. Takes for 7 days.  Dosing began 08/09/2013.      . budesonide-formoterol (SYMBICORT) 160-4.5 MCG/ACT inhaler Inhale 2 puffs into the lungs 2 (two) times daily.  1 Inhaler  6  . rivaroxaban (XARELTO) 20 MG TABS tablet Take 1 tablet (20 mg total) by mouth daily with supper.  30 tablet  3   Assessment: 68yo male with h/o PE and DVT who was on Xarelto PTA.  Pt may need liver biopsy and  Xarelto stopped.  Asked to bridge with Lovenox full dose until question of liver bx resolved. Pt has good renal fxn.   Platelets are better.  Goal of Therapy:  Anti-Xa level 0.6-1 units/ml 4hrs after LMWH dose given Monitor platelets by anticoagulation protocol: Yes   Plan: Lovenox 80mg  SQ q12hrs (1mg /Kg per dose) Monitor renal fxn and platelets  [place] Crane, Neila Teem A 08/21/2013,10:22 AM

## 2013-08-21 NOTE — Progress Notes (Signed)
ANTIBIOTIC CONSULT NOTE - follow up  Pharmacy Consult for Vancomycin & Aztreonam Indication: pneumonia  No Known Allergies  Patient Measurements: Height: 6\' 1"  (185.4 cm) Weight: 170 lb (77.111 kg) IBW/kg (Calculated) : 79.9  Vital Signs: Temp: 97 F (36.1 C) (07/10 0614) Temp src: Oral (07/10 0614) BP: 113/73 mmHg (07/10 0614) Pulse Rate: 84 (07/10 0614) Intake/Output from previous day: 07/09 0701 - 07/10 0700 In: 1270 [P.O.:720; I.V.:100; IV Piggyback:450] Out: 1800 [Urine:1800] Intake/Output from this shift: Total I/O In: 480 [P.O.:480] Out: 475 [Urine:475]  Labs:  Recent Labs  08/19/13 0544 08/20/13 0541 08/21/13 0504  WBC 15.5* 15.8*  --   HGB 14.4 14.5  --   PLT 101* 137*  --   CREATININE 1.08  --  1.09   Estimated Creatinine Clearance: 69.8 ml/min (by C-G formula based on Cr of 1.09).  Recent Labs  08/20/13 0541  VANCOTROUGH 18.9    Microbiology: Recent Results (from the past 720 hour(s))  CULTURE, BLOOD (ROUTINE X 2)     Status: None   Collection Time    08/05/13  7:55 PM      Result Value Ref Range Status   Specimen Description BLOOD LEFT ANTECUBITAL   Final   Special Requests BOTTLES DRAWN AEROBIC AND ANAEROBIC 6CC   Final   Culture NO GROWTH 5 DAYS   Final   Report Status 08/10/2013 FINAL   Final  CULTURE, BLOOD (ROUTINE X 2)     Status: None   Collection Time    08/05/13  8:02 PM      Result Value Ref Range Status   Specimen Description BLOOD LEFT HAND   Final   Special Requests BOTTLES DRAWN AEROBIC AND ANAEROBIC 6CC   Final   Culture NO GROWTH 5 DAYS   Final   Report Status 08/10/2013 FINAL   Final  MRSA PCR SCREENING     Status: None   Collection Time    08/06/13  5:18 PM      Result Value Ref Range Status   MRSA by PCR NEGATIVE  NEGATIVE Final   Comment:            The GeneXpert MRSA Assay (FDA     approved for NASAL specimens     only), is one component of a     comprehensive MRSA colonization     surveillance program. It is  not     intended to diagnose MRSA     infection nor to guide or     monitor treatment for     MRSA infections.  CULTURE, BLOOD (ROUTINE X 2)     Status: None   Collection Time    08/16/13  9:19 PM      Result Value Ref Range Status   Specimen Description BLOOD RIGHT ANTECUBITAL   Final   Special Requests BOTTLES DRAWN AEROBIC AND ANAEROBIC 12CC   Final   Culture NO GROWTH 4 DAYS   Final   Report Status PENDING   Incomplete  CULTURE, BLOOD (ROUTINE X 2)     Status: None   Collection Time    08/16/13  9:19 PM      Result Value Ref Range Status   Specimen Description BLOOD LEFT ANTECUBITAL   Final   Special Requests BOTTLES DRAWN AEROBIC AND ANAEROBIC 8CC   Final   Culture NO GROWTH 4 DAYS   Final   Report Status PENDING   Incomplete  CULTURE, EXPECTORATED SPUTUM-ASSESSMENT     Status: None  Collection Time    08/17/13  8:48 PM      Result Value Ref Range Status   Specimen Description SPUTUM   Final   Special Requests NONE   Final   Sputum evaluation     Final   Value: THIS SPECIMEN IS ACCEPTABLE. RESPIRATORY CULTURE REPORT TO FOLLOW.     PERFORMED AT [facility]   Report Status 08/17/2013 FINAL   Final  CULTURE, RESPIRATORY (NON-EXPECTORATED)     Status: None   Collection Time    08/17/13  8:48 PM      Result Value Ref Range Status   Specimen Description SPUTUM   Final   Special Requests NONE   Final   Gram Stain     Final   Value: RARE WBC PRESENT, PREDOMINANTLY MONONUCLEAR     FEW SQUAMOUS EPITHELIAL CELLS PRESENT     MODERATE GRAM POSITIVE COCCI IN PAIRS     IN CLUSTERS FEW GRAM POSITIVE RODS     FEW GRAM NEGATIVE RODS   Culture     Final   Value: NORMAL OROPHARYNGEAL FLORA     Performed at [facility]   Report Status 08/20/2013 FINAL   Final  BODY FLUID CULTURE     Status: None   Collection Time    08/18/13  3:30 PM      Result Value Ref Range Status   Specimen Description PLEURAL FLUID   Final   Special Requests IMMUNE:COMPROMISED   Final   Gram Stain      Final   Value: RARE WBC PRESENT, PREDOMINANTLY PMN     NO ORGANISMS SEEN     Performed at [facility]   Culture     Final   Value: FEW GRAM NEGATIVE RODS     Note: CALLED TO VALDESE DILDY 08/20/13 1140 BY SMITHERSJ     Performed at [facility]   Report Status PENDING   Incomplete   Medical History: Past Medical History  Diagnosis Date  . H/O renal calculi   . Urethral stricture   . Wears hearing aid     right ear  . Hearing impaired person, right   . Arthritis   . Macular degeneration   . Ulcerative colitis   . History of kidney stones   . Pulmonary embolus 07/10/2013  . DVT, lower extremity 07/10/2013    LLE  . COPD (chronic obstructive pulmonary disease)   . Prostate cancer 10/24/07    radiation therapy  . Metastatic disease     unknown source, found on CT A/P 08/08/2013   Medications:  Scheduled:  . antiseptic oral rinse  15 mL Mouth Rinse BID  . aztreonam  2 g Intravenous Q8H  . budesonide-formoterol  2 puff Inhalation BID  . enoxaparin (LOVENOX) injection  80 mg Subcutaneous Q12H  . folic acid  1 mg Oral Daily  . multivitamin with minerals  1 tablet Oral Daily  . polyethylene glycol  17 g Oral BID  . thiamine  100 mg Oral Daily  . vancomycin  750 mg Intravenous Q12H   Assessment: 68 yo M with recurrent PNA.  He has been hospitalized recently for PNA so plan to treat with empiric, broad-spectrum antibiotics for HCAP.  WBC is elevated & stable.  Patient was febrile on admission but fever has resolved.  CXR + PNA. Renal fxn is stable.  Estimated Creatinine Clearance: 69.8 ml/min (by C-G formula based on Cr of 1.09). Trough level is on target.  Vancomycin 7/6>> Aztreonam  7/6>>  Goal of Therapy:  Vancomycin trough level 15-20 mcg/ml  Plan:  Continue Aztreonam Continue Vancomycin 750mg  IV q12h Check Vancomycin trough level weekly or sooner if indicated Monitor renal function and cx data  Deescalate antibiotics when appropriate  Hart Robinsons  A 08/21/2013,10:21 AM

## 2013-08-22 ENCOUNTER — Encounter (HOSPITAL_COMMUNITY): Payer: Self-pay | Admitting: Family Medicine

## 2013-08-22 DIAGNOSIS — C3491 Malignant neoplasm of unspecified part of right bronchus or lung: Secondary | ICD-10-CM | POA: Diagnosis present

## 2013-08-22 LAB — BODY FLUID CULTURE

## 2013-08-22 LAB — CBC
HCT: 43.8 % (ref 39.0–52.0)
Hemoglobin: 14.3 g/dL (ref 13.0–17.0)
MCH: 29.4 pg (ref 26.0–34.0)
MCHC: 32.6 g/dL (ref 30.0–36.0)
MCV: 89.9 fL (ref 78.0–100.0)
PLATELETS: 203 10*3/uL (ref 150–400)
RBC: 4.87 MIL/uL (ref 4.22–5.81)
RDW: 15.8 % — AB (ref 11.5–15.5)
WBC: 13.5 10*3/uL — AB (ref 4.0–10.5)

## 2013-08-22 MED ORDER — DEXTROSE 5 % IV SOLN
2.0000 g | Freq: Three times a day (TID) | INTRAVENOUS | Status: DC
  Administered 2013-08-22 – 2013-08-24 (×6): 2 g via INTRAVENOUS
  Filled 2013-08-22 (×6): qty 2

## 2013-08-22 NOTE — Consult Note (Signed)
Patient seen, chart reviewed. Have scheduled patient for Port-A-Cath placement on 08/24/2013. The risks and benefits of the procedure were explained to the patient, who gave informed consent. He may be discharged after Port-A-Cath placement.

## 2013-08-22 NOTE — Progress Notes (Signed)
PROGRESS NOTE  RAMA MCCLINTOCK RWE:[id number] DOB: 23-Sep-1945 DOA: 08/16/2013 PCP: [name], FNP  Summary: 68 year old man diagnosed with submassive pulmonary embolism with right ventricular strain, left lower extremity DVT, possible healthcare associated pneumonia 5/29, treated [name] who was rehospitalized 6/24-6/28 for suspected recurrent pneumonia. Imaging at that time suggested hepatic metastatic disease. On discharge plans were made for outpatient liver biopsy to further evaluate.  He presented to the emergency Department 7/5 with fever, recurrent cough and shortness of breath and was admitted for treatment of healthcare associated pneumonia and pleural effusion. He was seen by oncology and recommendations were to obtain thoracentesis, some fluid for cytology and obtain ultrasound-guided biopsy of the liver at a later date if nondiagnostic.  Assessment/Plan: 1. Acute hypoxic respiratory failure. Stable on 4 L. Multifactorial, newly diagnosed lung cancer, submassive pulmonary embolism, suspected COPD, healthcare associated pneumonia. 2. 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 Healthcare associated pneumonia MRSA, pseudomonas aeruginosa with sepsis on admission.  4. Right pleural effusion, exudative. Status post thoracentesis 7/7.  5. Tiny right apical pneumothorax post procedure thoracentesis. Management per pulmonology. Asymptomatic. 6. Thrombocytopenia. Resolved. Secondary to infection. 7. Submassive pulmonary embolism with right ventricular strain diagnosed 5/29. Resume Xarelto on discharge. 8. Suspected COPD. 9. Tobacco dependence. Cessation recommended.   Continue vancomycin, discontinue aztreonam, start cefepime.   Port placement 7/13 and likely home thereafter. Start heparin tomorrow evening and Xarelto post-operatively, will discuss with surgery.  Bowel regimen  Will likely need home oxygen  Discussed in detail  with wife at bedside.  Outpatient liver biopsy  Code Status: full code DVT prophylaxis: Lovenox Family Communication: Discussed with wife at bedside Disposition Plan: Pending  [name], MD  [facility]  Pager [phone] If 7PM-7AM, please contact night-coverage at [email or web address], password [facility] 08/22/2013, 12:55 PM  LOS: 6 days   Consultants:  Oncology   Pulmonology  General surgery  Speech therapy: Regular, thin liquid  Physical therapy: home health.  Procedures:  7/11. Ultrasound guided thoracentesis with removal of 800 mL of bloody colored fluid.  Antibiotics:  Vancomycin 7/6>> 7/19  Cefepime 7/11 >> 7/19  Aztreonam 7/6>> 7/11  HPI/Subjective: Continues to have some right-sided chest pain at times although currently pain free. Breathing okay at rest but becomes quite dyspneic with exertion. However, recovers quickly.  Objective: Filed Vitals:   08/21/13 1918 08/21/13 2048 08/22/13 0525 08/22/13 0731  BP:  108/82 123/83   Pulse:  78 74   Temp:  98.2 F (36.8 C) 98.4 F (36.9 C)   TempSrc:  Oral Oral   Resp:  20 20   Height:      Weight:      SpO2: 92% 95% 94% 94%    Intake/Output Summary (Last 24 hours) at 08/22/13 1255 Last data filed at 08/22/13 1218  Gross per 24 hour  Intake   2100 ml  Output   1400 ml  Net    700 ml     Filed Weights   08/16/13 1451  Weight: 77.111 kg (170 lb)    Exam:  Afebrile, vital signs stable. Stable hypoxia on 4 L. Gen. Appears calm, comfortable, chronically ill but nontoxic. Psych. Alert. Speech fluent and clear. Cardiovascular. Regular rate and rhythm. No murmur, rub or gallop. Respiratory. Respiratory clear to auscultation bilaterally. Fair air movement. It will speak in full sentences. Mild increased respiratory effort.  Data Reviewed: I/O: Urine output 1875 Heme: Leukocytosis trending downwards. Thrombocytopenia has resolved. ID: Pleural fluid  culture MRSA sensitive to Bactrim and vancomycin  and Pseudomonas aeruginosa sensitive to cefepime, Zosyn.  Scheduled Meds: . antiseptic oral rinse  15 mL Mouth Rinse BID  . aztreonam  2 g Intravenous Q8H  . budesonide-formoterol  2 puff Inhalation BID  . enoxaparin (LOVENOX) injection  80 mg Subcutaneous Q12H  . folic acid  1 mg Oral Daily  . multivitamin with minerals  1 tablet Oral Daily  . polyethylene glycol  17 g Oral BID  . thiamine  100 mg Oral Daily  . vancomycin  750 mg Intravenous Q12H   Continuous Infusions:    Principal Problem:   HCAP (healthcare-associated pneumonia) Active Problems:   Pulmonary embolism   Lung nodule   COPD (chronic obstructive pulmonary disease)-PFT pending    Pneumonia   Stage IV NSCLC right lung   Time spent 20 minutes

## 2013-08-22 NOTE — Progress Notes (Signed)
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 Based on his CT abnormalities he would be stage IV. I think this is the reason that he keeps getting sick with pneumonia and chest problems. I will plan to follow somewhat more peripherally now the diagnosis has been established

## 2013-08-22 NOTE — Progress Notes (Signed)
ANTIBIOTIC CONSULT NOTE - INITIAL  Pharmacy Consult for Cefepime Indication: pneumonia  No Known Allergies  Patient Measurements: Height: 6\' 1"  (185.4 cm) Weight: 170 lb (77.111 kg) IBW/kg (Calculated) : 79.9 Adjusted Body Weight:   Vital Signs: Temp: 98.4 F (36.9 C) (07/11 0525) Temp src: Oral (07/11 0525) BP: 123/83 mmHg (07/11 0525) Pulse Rate: 74 (07/11 0525) Intake/Output from previous day: 07/10 0701 - 07/11 0700 In: 1780 [P.O.:1680; IV Piggyback:100] Out: 1875 [Urine:1875] Intake/Output from this shift: Total I/O In: 800 [P.O.:800] Out: -   Labs:  Recent Labs  08/20/13 0541 08/21/13 0504 08/22/13 0558  WBC 15.8*  --  13.5*  HGB 14.5  --  14.3  PLT 137*  --  203  CREATININE  --  1.09  --    Estimated Creatinine Clearance: 69.8 ml/min (by C-G formula based on Cr of 1.09).  Recent Labs  08/20/13 0541  VANCOTROUGH 18.9     Microbiology: Recent Results (from the past 720 hour(s))  CULTURE, BLOOD (ROUTINE X 2)     Status: None   Collection Time    08/05/13  7:55 PM      Result Value Ref Range Status   Specimen Description BLOOD LEFT ANTECUBITAL   Final   Special Requests BOTTLES DRAWN AEROBIC AND ANAEROBIC 6CC   Final   Culture NO GROWTH 5 DAYS   Final   Report Status 08/10/2013 FINAL   Final  CULTURE, BLOOD (ROUTINE X 2)     Status: None   Collection Time    08/05/13  8:02 PM      Result Value Ref Range Status   Specimen Description BLOOD LEFT HAND   Final   Special Requests BOTTLES DRAWN AEROBIC AND ANAEROBIC 6CC   Final   Culture NO GROWTH 5 DAYS   Final   Report Status 08/10/2013 FINAL   Final  MRSA PCR SCREENING     Status: None   Collection Time    08/06/13  5:18 PM      Result Value Ref Range Status   MRSA by PCR NEGATIVE  NEGATIVE Final   Comment:            The GeneXpert MRSA Assay (FDA     approved for NASAL specimens     only), is one component of a     comprehensive MRSA colonization     surveillance program. It is not   intended to diagnose MRSA     infection nor to guide or     monitor treatment for     MRSA infections.  CULTURE, BLOOD (ROUTINE X 2)     Status: None   Collection Time    08/16/13  9:19 PM      Result Value Ref Range Status   Specimen Description BLOOD RIGHT ANTECUBITAL   Final   Special Requests BOTTLES DRAWN AEROBIC AND ANAEROBIC 12CC   Final   Culture NO GROWTH 5 DAYS   Final   Report Status 08/21/2013 FINAL   Final  CULTURE, BLOOD (ROUTINE X 2)     Status: None   Collection Time    08/16/13  9:19 PM      Result Value Ref Range Status   Specimen Description BLOOD LEFT ANTECUBITAL   Final   Special Requests BOTTLES DRAWN AEROBIC AND ANAEROBIC 8CC   Final   Culture NO GROWTH 5 DAYS   Final   Report Status 08/21/2013 FINAL   Final  CULTURE, EXPECTORATED SPUTUM-ASSESSMENT     Status: None  Collection Time    08/17/13  8:48 PM      Result Value Ref Range Status   Specimen Description SPUTUM   Final   Special Requests NONE   Final   Sputum evaluation     Final   Value: THIS SPECIMEN IS ACCEPTABLE. RESPIRATORY CULTURE REPORT TO FOLLOW.     PERFORMED AT [facility]   Report Status 08/17/2013 FINAL   Final  CULTURE, RESPIRATORY (NON-EXPECTORATED)     Status: None   Collection Time    08/17/13  8:48 PM      Result Value Ref Range Status   Specimen Description SPUTUM   Final   Special Requests NONE   Final   Gram Stain     Final   Value: RARE WBC PRESENT, PREDOMINANTLY MONONUCLEAR     FEW SQUAMOUS EPITHELIAL CELLS PRESENT     MODERATE GRAM POSITIVE COCCI IN PAIRS     IN CLUSTERS FEW GRAM POSITIVE RODS     FEW GRAM NEGATIVE RODS   Culture     Final   Value: NORMAL OROPHARYNGEAL FLORA     Performed at [facility]   Report Status 08/20/2013 FINAL   Final  BODY FLUID CULTURE     Status: None   Collection Time    08/18/13  3:30 PM      Result Value Ref Range Status   Specimen Description PLEURAL FLUID   Final   Special Requests IMMUNE:COMPROMISED   Final   Gram Stain      Final   Value: RARE WBC PRESENT, PREDOMINANTLY PMN     NO ORGANISMS SEEN     Performed at [facility]   Culture     Final   Value: FEW PSEUDOMONAS AERUGINOSA     Note: CALLED TO VALDESE DILDY 08/20/13 1140 BY SMITHERSJ     RARE METHICILLIN RESISTANT STAPHYLOCOCCUS AUREUS     Note: RIFAMPIN AND GENTAMICIN SHOULD NOT BE USED AS SINGLE DRUGS FOR TREATMENT OF STAPH INFECTIONS. CRITICAL RESULT CALLED TO, READ BACK BY AND VERIFIED WITH: VAL DESE@1420  ON [id number] BY [place] This organism is presumed to be Clindamycin resistant based on      detection of inducible Clindamycin resistance.     Performed at [facility]   Report Status 08/22/2013 FINAL   Final   Organism ID, Bacteria PSEUDOMONAS AERUGINOSA   Final   Organism ID, Bacteria METHICILLIN RESISTANT STAPHYLOCOCCUS AUREUS   Final  MRSA PCR SCREENING     Status: None   Collection Time    08/21/13  7:35 PM      Result Value Ref Range Status   MRSA by PCR NEGATIVE  NEGATIVE Final   Comment:            The GeneXpert MRSA Assay (FDA     approved for NASAL specimens     only), is one component of a     comprehensive MRSA colonization     surveillance program. It is not     intended to diagnose MRSA     infection nor to guide or     monitor treatment for     MRSA infections.    Medical History: Past Medical History  Diagnosis Date  . H/O renal calculi   . Urethral stricture   . Wears hearing aid     right ear  . Hearing impaired Carneshia Raker, right   . Arthritis   . Macular degeneration   . Ulcerative colitis   . History of  kidney stones   . Pulmonary embolus 07/10/2013  . DVT, lower extremity 07/10/2013    LLE  . COPD (chronic obstructive pulmonary disease)   . Prostate cancer 10/24/07    radiation therapy  . Metastatic disease     unknown source, found on CT A/P 08/08/2013    Medications:  Scheduled:  . antiseptic oral rinse  15 mL Mouth Rinse BID  . budesonide-formoterol  2 puff Inhalation BID  . ceFEPime  (MAXIPIME) IV  2 g Intravenous Q8H  . enoxaparin (LOVENOX) injection  80 mg Subcutaneous Q12H  . folic acid  1 mg Oral Daily  . multivitamin with minerals  1 tablet Oral Daily  . polyethylene glycol  17 g Oral BID  . thiamine  100 mg Oral Daily  . vancomycin  750 mg Intravenous Q12H   Assessment: Pseudomonas aeruginosa culture MSRA culture Cefepime HCAP, Azactam discontinued Also on Vancomycin   Goal of Therapy:  Eradicate infection  Plan:  Cefepime 2 GM IV every 8 hours Monitor renal function Labs per protocol F/U LOT  Abner Greenspan, Chantavia Bazzle Bennett 08/22/2013,1:07 PM

## 2013-08-23 MED ORDER — HEPARIN (PORCINE) IN NACL 100-0.45 UNIT/ML-% IJ SOLN
1200.0000 [IU]/h | INTRAMUSCULAR | Status: AC
  Administered 2013-08-23: 1200 [IU]/h via INTRAVENOUS
  Filled 2013-08-23: qty 250

## 2013-08-23 MED ORDER — HEPARIN BOLUS VIA INFUSION
4000.0000 [IU] | Freq: Once | INTRAVENOUS | Status: AC
  Administered 2013-08-23: 4000 [IU] via INTRAVENOUS
  Filled 2013-08-23: qty 4000

## 2013-08-23 NOTE — Progress Notes (Signed)
ANTICOAGULATION CONSULT NOTE - Initial Consult  Pharmacy Consult for Heparin Indication: H/O PE & DVT   No Known Allergies  Patient Measurements: Height: 6\' 1"  (185.4 cm) Weight: 170 lb (77.111 kg) IBW/kg (Calculated) : 79.9 Heparin Dosing Weight: 77.1 kg  Vital Signs: Temp: 97.7 F (36.5 C) (07/12 1543) Temp src: Oral (07/12 1543) BP: 125/96 mmHg (07/12 1543) Pulse Rate: 80 (07/12 1543)  Labs:  Recent Labs  08/21/13 0504 08/22/13 0558  HGB  --  14.3  HCT  --  43.8  PLT  --  203  CREATININE 1.09  --     Estimated Creatinine Clearance: 69.8 ml/min (by C-G formula based on Cr of 1.09).   Medical History: Past Medical History  Diagnosis Date  . H/O renal calculi   . Urethral stricture   . Wears hearing aid     right ear  . Hearing impaired Glennda Weatherholtz, right   . Arthritis   . Macular degeneration   . Ulcerative colitis   . History of kidney stones   . Pulmonary embolus 07/10/2013  . DVT, lower extremity 07/10/2013    LLE  . COPD (chronic obstructive pulmonary disease)   . Prostate cancer 10/24/07    radiation therapy  . Non-small cell cancer of right lung 08/21/2013    diagnosed by cytology from thorocentesis    Medications:  Scheduled:  . antiseptic oral rinse  15 mL Mouth Rinse BID  . budesonide-formoterol  2 puff Inhalation BID  . ceFEPime (MAXIPIME) IV  2 g Intravenous D9M  . folic acid  1 mg Oral Daily  . heparin  4,000 Units Intravenous Once  . multivitamin with minerals  1 tablet Oral Daily  . polyethylene glycol  17 g Oral BID  . thiamine  100 mg Oral Daily  . vancomycin  750 mg Intravenous Q12H    Assessment: 68 y/o male h/o PE & DVT who was on Xarelto PTA. Xarelto stopped on admission, Lovenox full dose due to possible liver biopsy. Asked to discontinue Lovenox and start Heparin due to surgery tomorrow, portacath placement for possible IV antibiotics after discharge. After surgery plan to resume Xarelto for likely discharge on tomorrow. Last  Lovenox dose today at 523 AM.  Goal of Therapy:  Heparin level 0.3-0.7 units/ml Monitor platelets by anticoagulation protocol: Yes   Plan:  Give 4000 units bolus x 1 Start heparin infusion at 1200 units/hr Continue to monitor H&H and platelets Unfractionated heparin level 8 hours after starting heparin Discontinue heparin at 6 AM tomorrow morning for potential 11 AM surgery F/U [facility] plan after surgery  Abner Greenspan, Theotis Gerdeman Bennett 08/23/2013,5:22 PM

## 2013-08-23 NOTE — Progress Notes (Signed)
Orders written for a Port-A-Cath insertion tomorrow.

## 2013-08-23 NOTE — Progress Notes (Signed)
PROGRESS NOTE  Jonathan Goodwin GBT:[id number] DOB: 1945-05-30 DOA: 08/16/2013 PCP: [name], FNP  Summary: 68 year old man diagnosed with submassive pulmonary embolism with right ventricular strain, left lower extremity DVT, possible healthcare associated pneumonia 5/29, treated [name] who was rehospitalized 6/24-6/28 for suspected recurrent pneumonia. Imaging at that time suggested hepatic metastatic disease. On discharge plans were made for outpatient liver biopsy to further evaluate.  He presented to the emergency Department 7/5 with fever, recurrent cough and shortness of breath and was admitted for treatment of healthcare associated pneumonia and pleural effusion. He was seen by oncology and recommendations were to obtain thoracentesis, some fluid for cytology and obtain ultrasound-guided biopsy of the liver at a later date if nondiagnostic.  Assessment/Plan: 1. Acute hypoxic respiratory failure. Remains stable on 4 L. Multifactorial, newly diagnosed lung cancer, submassive pulmonary embolism, suspected COPD, healthcare associated pneumonia. 2. Stage IV NSCLC diagnosed by thoracentesis cytology. Port placement 7/13, outpatient followup with oncology for chemotherapy. 3. Healthcare associated pneumonia MRSA, pseudomonas aeruginosa with sepsis on admission. Stable, decreasing leukocytosis. Afebrile. 4. Right pleural effusion, exudative. Status post thoracentesis 7/7.  5. Tiny right apical pneumothorax post procedure thoracentesis. Asymptomatic. 6. Thrombocytopenia. Resolved. Secondary to infection. 7. Submassive pulmonary embolism with right ventricular strain diagnosed 5/29. Resume Xarelto on discharge. 8. Suspected COPD. stable. 9. Tobacco dependence. Recommend cessation.   Overall stable. Plan port insertion 7/13, likely discharge home on home oxygen, final determination in regard IV versus oral antibiotics at that time.  Change to heparin this evening in anticipation of  surgery, stop tomorrow before surgery.  Resume Xarelto after surgery.  Discussed with wife at bedside.  Code Status: full code DVT prophylaxis: Lovenox Family Communication:  Disposition Plan: home with [facility]  [name], MD  [facility]  Pager [phone] If 7PM-7AM, please contact night-coverage at [email or web address], password [facility] 08/23/2013, 3:22 PM  LOS: 7 days   Consultants:  Oncology   Pulmonology  General surgery  Speech therapy: Regular, thin liquid  Physical therapy: home health.  Procedures:  7/11. Ultrasound guided thoracentesis with removal of 800 mL of bloody colored fluid.  Antibiotics:  Vancomycin 7/6 >>  Cefepime 7/11 >>  Aztreonam 7/6>> 7/11  HPI/Subjective: Overall doing okay. Pain has decreased. Gets quite short of breath with exertion. Looking forward to going home 7/13.  Dr. Arnoldo Morale plans port insertion 7/13.  Objective: Filed Vitals:   08/22/13 1918 08/22/13 1948 08/23/13 0437 08/23/13 0726  BP:  116/74 117/72   Pulse:  91 73   Temp:  98.3 F (36.8 C) 98 F (36.7 C)   TempSrc:  Oral Oral   Resp:  20 20   Height:      Weight:      SpO2: 93% 95% 96% 94%    Intake/Output Summary (Last 24 hours) at 08/23/13 1522 Last data filed at 08/23/13 1259  Gross per 24 hour  Intake   1910 ml  Output   2850 ml  Net   -940 ml     Filed Weights   08/16/13 1451  Weight: 77.111 kg (170 lb)    Exam:  Stable hypoxia on 4 L. Afebrile. Vitals stable. Gen. Appears calm, comfortable. Psych. Alert. Speech fluent and clear. Cardiovascular. No murmur, rub or gallop. Vascular regular rate and rhythm.  Respiratory. Clear to auscultation bilaterally. No wheezes, rales or rhonchi. Normal respiratory effort.  Data Reviewed: I/O: Excellent urine output  Scheduled Meds: . antiseptic oral rinse  15 mL Mouth Rinse BID  . budesonide-formoterol  2 puff  Inhalation BID  . ceFEPime (MAXIPIME) IV  2 g Intravenous Q8H  . enoxaparin (LOVENOX)  injection  80 mg Subcutaneous Q12H  . folic acid  1 mg Oral Daily  . multivitamin with minerals  1 tablet Oral Daily  . polyethylene glycol  17 g Oral BID  . thiamine  100 mg Oral Daily  . vancomycin  750 mg Intravenous Q12H   Continuous Infusions:    Principal Problem:   HCAP (healthcare-associated pneumonia) Active Problems:   Pulmonary embolism   Lung nodule   COPD (chronic obstructive pulmonary disease)-PFT pending    Pneumonia   Stage IV NSCLC right lung   Time spent 20 minutes

## 2013-08-23 NOTE — Progress Notes (Signed)
Patient was scheduled to receive IV vancomycin and IV heparin @1800 . Verified compatibility with pharmacy, IV vancomycin and IV heparin were not compatible. Unable to start a second IV because patient refused. Vancomycin was started @ 1825. And Heparin IV was started @ 1958 and patient received the heparin bolus at that time. Will continue to monitor patient at this time.

## 2013-08-24 ENCOUNTER — Encounter (HOSPITAL_COMMUNITY): Payer: Medicare Other | Admitting: Anesthesiology

## 2013-08-24 ENCOUNTER — Encounter (HOSPITAL_COMMUNITY)
Admission: EM | Disposition: A | Discharge status: Home Health | Payer: Self-pay | Source: Home / Self Care | Attending: Family Medicine

## 2013-08-24 ENCOUNTER — Inpatient Hospital Stay (HOSPITAL_COMMUNITY): Payer: Medicare Other

## 2013-08-24 ENCOUNTER — Other Ambulatory Visit (HOSPITAL_COMMUNITY): Payer: Medicare Other

## 2013-08-24 ENCOUNTER — Ambulatory Visit: Payer: Medicare Other | Admitting: Internal Medicine

## 2013-08-24 ENCOUNTER — Encounter (HOSPITAL_COMMUNITY): Payer: Self-pay | Admitting: *Deleted

## 2013-08-24 ENCOUNTER — Inpatient Hospital Stay (HOSPITAL_COMMUNITY): Payer: Medicare Other | Admitting: Anesthesiology

## 2013-08-24 HISTORY — PX: PORTACATH PLACEMENT: SHX2246

## 2013-08-24 LAB — BASIC METABOLIC PANEL
Anion gap: 7 (ref 5–15)
Anion gap: 8 (ref 5–15)
BUN: 18 mg/dL (ref 6–23)
BUN: 17 mg/dL (ref 6–23)
CALCIUM: 8.6 mg/dL (ref 8.4–10.5)
CALCIUM: 8.5 mg/dL (ref 8.4–10.5)
CO2: 29 mEq/L (ref 19–32)
CO2: 28 mEq/L (ref 19–32)
CREATININE: 1.01 mg/dL (ref 0.50–1.35)
Chloride: 104 mEq/L (ref 96–112)
Chloride: 105 mEq/L (ref 96–112)
Creatinine, Ser: 1.12 mg/dL (ref 0.50–1.35)
GFR calc Af Amer: 86 mL/min — ABNORMAL LOW (ref >90)
GFR, EST AFRICAN AMERICAN: 76 mL/min — AB (ref >90)
GFR, EST NON AFRICAN AMERICAN: 65 mL/min — AB (ref >90)
GFR, EST NON AFRICAN AMERICAN: 74 mL/min — AB (ref >90)
GLUCOSE: 107 mg/dL — AB (ref 70–99)
GLUCOSE: 106 mg/dL — AB (ref 70–99)
Potassium: 6 mEq/L — ABNORMAL HIGH (ref 3.7–5.3)
Potassium: 5.2 mEq/L (ref 3.7–5.3)
Sodium: 140 mEq/L (ref 137–147)
Sodium: 141 mEq/L (ref 137–147)

## 2013-08-24 SURGERY — INSERTION, TUNNELED CENTRAL VENOUS DEVICE, WITH PORT
Anesthesia: Monitor Anesthesia Care | Site: Chest | Laterality: Left

## 2013-08-24 MED ORDER — LIDOCAINE HCL (PF) 1 % IJ SOLN
INTRAMUSCULAR | Status: AC
  Filled 2013-08-24: qty 30

## 2013-08-24 MED ORDER — ONDANSETRON HCL 4 MG/2ML IJ SOLN
INTRAMUSCULAR | Status: AC
  Filled 2013-08-24: qty 2

## 2013-08-24 MED ORDER — ARTIFICIAL TEARS OP OINT
TOPICAL_OINTMENT | OPHTHALMIC | Status: AC
  Filled 2013-08-24: qty 3.5

## 2013-08-24 MED ORDER — MIDAZOLAM HCL 2 MG/2ML IJ SOLN
INTRAMUSCULAR | Status: AC
  Filled 2013-08-24: qty 2

## 2013-08-24 MED ORDER — FENTANYL CITRATE 0.05 MG/ML IJ SOLN
INTRAMUSCULAR | Status: AC
  Filled 2013-08-24: qty 2

## 2013-08-24 MED ORDER — LIDOCAINE HCL (PF) 1 % IJ SOLN
INTRAMUSCULAR | Status: DC | PRN
  Administered 2013-08-24: 10 mL

## 2013-08-24 MED ORDER — EPHEDRINE SULFATE 50 MG/ML IJ SOLN
INTRAMUSCULAR | Status: AC
  Filled 2013-08-24: qty 1

## 2013-08-24 MED ORDER — HEPARIN SOD (PORK) LOCK FLUSH 100 UNIT/ML IV SOLN
INTRAVENOUS | Status: DC | PRN
  Administered 2013-08-24: 500 [IU] via INTRAVENOUS

## 2013-08-24 MED ORDER — MIDAZOLAM HCL 2 MG/2ML IJ SOLN
1.0000 mg | INTRAMUSCULAR | Status: DC | PRN
  Administered 2013-08-24: 2 mg via INTRAVENOUS

## 2013-08-24 MED ORDER — ENOXAPARIN SODIUM 80 MG/0.8ML ~~LOC~~ SOLN
1.0000 mg/kg | Freq: Once | SUBCUTANEOUS | Status: AC
  Administered 2013-08-24: 75 mg via SUBCUTANEOUS
  Filled 2013-08-24: qty 0.8

## 2013-08-24 MED ORDER — KETOROLAC TROMETHAMINE 30 MG/ML IJ SOLN
30.0000 mg | Freq: Once | INTRAMUSCULAR | Status: AC
  Administered 2013-08-24: 30 mg via INTRAVENOUS
  Filled 2013-08-24: qty 1

## 2013-08-24 MED ORDER — HEPARIN SOD (PORK) LOCK FLUSH 100 UNIT/ML IV SOLN
INTRAVENOUS | Status: AC
  Filled 2013-08-24: qty 5

## 2013-08-24 MED ORDER — DEXAMETHASONE SODIUM PHOSPHATE 4 MG/ML IJ SOLN
INTRAMUSCULAR | Status: AC
  Filled 2013-08-24: qty 1

## 2013-08-24 MED ORDER — FENTANYL CITRATE 0.05 MG/ML IJ SOLN
25.0000 ug | INTRAMUSCULAR | Status: AC
  Administered 2013-08-24 (×2): 25 ug via INTRAVENOUS

## 2013-08-24 MED ORDER — SODIUM CHLORIDE 0.9 % IV SOLN
INTRAVENOUS | Status: DC | PRN
  Administered 2013-08-24: 500 mL via INTRAMUSCULAR

## 2013-08-24 MED ORDER — SODIUM POLYSTYRENE SULFONATE 15 GM/60ML PO SUSP
15.0000 g | Freq: Once | ORAL | Status: AC
  Administered 2013-08-24: 15 g via ORAL
  Filled 2013-08-24: qty 60

## 2013-08-24 MED ORDER — FENTANYL CITRATE 0.05 MG/ML IJ SOLN
INTRAMUSCULAR | Status: DC | PRN
  Administered 2013-08-24: 25 ug via INTRAVENOUS

## 2013-08-24 MED ORDER — SODIUM CHLORIDE 0.9 % IV BOLUS (SEPSIS)
500.0000 mL | Freq: Once | INTRAVENOUS | Status: AC
  Administered 2013-08-24: 500 mL via INTRAVENOUS

## 2013-08-24 MED ORDER — PROPOFOL INFUSION 10 MG/ML OPTIME
INTRAVENOUS | Status: DC | PRN
  Administered 2013-08-24: 150 ug/kg/min via INTRAVENOUS

## 2013-08-24 MED ORDER — LEVOFLOXACIN 750 MG PO TABS
750.0000 mg | ORAL_TABLET | Freq: Every day | ORAL | Status: DC
  Administered 2013-08-25: 750 mg via ORAL
  Filled 2013-08-24 (×2): qty 1

## 2013-08-24 MED ORDER — LIDOCAINE HCL (CARDIAC) 10 MG/ML IV SOLN
INTRAVENOUS | Status: DC | PRN
  Administered 2013-08-24: 10 mg via INTRAVENOUS

## 2013-08-24 MED ORDER — ONDANSETRON HCL 4 MG/2ML IJ SOLN: 4.0000 mg | Freq: Once | INTRAMUSCULAR | Status: DC | PRN

## 2013-08-24 MED ORDER — PROPOFOL 10 MG/ML IV EMUL
INTRAVENOUS | Status: AC
  Filled 2013-08-24: qty 20

## 2013-08-24 MED ORDER — FENTANYL CITRATE 0.05 MG/ML IJ SOLN: 25.0000 ug | INTRAMUSCULAR | Status: DC | PRN

## 2013-08-24 MED ORDER — ENSURE COMPLETE PO LIQD: 237.0000 mL | Freq: Two times a day (BID) | ORAL | Status: DC

## 2013-08-24 MED ORDER — ONDANSETRON HCL 4 MG/2ML IJ SOLN
4.0000 mg | Freq: Once | INTRAMUSCULAR | Status: AC
  Administered 2013-08-24: 4 mg via INTRAVENOUS

## 2013-08-24 MED ORDER — LACTATED RINGERS IV SOLN
INTRAVENOUS | Status: DC
  Administered 2013-08-24: 1000 mL via INTRAVENOUS

## 2013-08-24 MED ORDER — DOXYCYCLINE HYCLATE 100 MG PO TABS
100.0000 mg | ORAL_TABLET | Freq: Two times a day (BID) | ORAL | Status: DC
  Administered 2013-08-24 – 2013-08-25 (×3): 100 mg via ORAL
  Filled 2013-08-24 (×3): qty 1

## 2013-08-24 MED ORDER — LIDOCAINE HCL (PF) 1 % IJ SOLN
INTRAMUSCULAR | Status: AC
  Filled 2013-08-24: qty 5

## 2013-08-24 MED ORDER — CHLORHEXIDINE GLUCONATE 4 % EX LIQD: 1.0000 "application " | Freq: Once | CUTANEOUS | Status: DC

## 2013-08-24 SURGICAL SUPPLY — 38 items
ADH SKN CLS APL DERMABOND .7 (GAUZE/BANDAGES/DRESSINGS) ×1
APPLIER CLIP 9.375 SM OPEN (CLIP)
APR CLP SM 9.3 20 MLT OPN (CLIP)
BAG DECANTER FOR FLEXI CONT (MISCELLANEOUS) ×3 IMPLANT
BAG HAMPER (MISCELLANEOUS) ×3 IMPLANT
CATH HICKMAN DUAL 12.0 (CATHETERS) IMPLANT
CLIP APPLIE 9.375 SM OPEN (CLIP) IMPLANT
CLOTH BEACON ORANGE TIMEOUT ST (SAFETY) ×3 IMPLANT
COVER LIGHT HANDLE STERIS (MISCELLANEOUS) ×6 IMPLANT
DECANTER SPIKE VIAL GLASS SM (MISCELLANEOUS) ×3 IMPLANT
DERMABOND ADVANCED (GAUZE/BANDAGES/DRESSINGS) ×2
DERMABOND ADVANCED .7 DNX12 (GAUZE/BANDAGES/DRESSINGS) ×1 IMPLANT
DRAPE C-ARM FOLDED MOBILE STRL (DRAPES) ×3 IMPLANT
DURAPREP 6ML APPLICATOR 50/CS (WOUND CARE) ×3 IMPLANT
ELECT REM PT RETURN 9FT ADLT (ELECTROSURGICAL) ×3
ELECTRODE REM PT RTRN 9FT ADLT (ELECTROSURGICAL) ×1 IMPLANT
GLOVE SURG SS PI 7.5 STRL IVOR (GLOVE) ×6 IMPLANT
GOWN STRL REUS W/TWL LRG LVL3 (GOWN DISPOSABLE) ×6 IMPLANT
IV NS 500ML (IV SOLUTION) ×3
IV NS 500ML BAXH (IV SOLUTION) ×1 IMPLANT
KIT PORT POWER 8FR ISP MRI (CATHETERS) ×3 IMPLANT
KIT ROOM TURNOVER APOR (KITS) ×3 IMPLANT
MANIFOLD NEPTUNE II (INSTRUMENTS) ×3 IMPLANT
NDL HYPO 25X1 1.5 SAFETY (NEEDLE) ×1 IMPLANT
NEEDLE HYPO 25X1 1.5 SAFETY (NEEDLE) ×3 IMPLANT
PACK MINOR (CUSTOM PROCEDURE TRAY) ×3 IMPLANT
PAD ARMBOARD 7.5X6 YLW CONV (MISCELLANEOUS) ×3 IMPLANT
SET BASIN LINEN APH (SET/KITS/TRAYS/PACK) ×3 IMPLANT
SET INTRODUCER 12FR PACEMAKER (SHEATH) IMPLANT
SHEATH COOK PEEL AWAY SET 8F (SHEATH) IMPLANT
SUT PROLENE 3 0 PS 2 (SUTURE) IMPLANT
SUT VIC AB 3-0 SH 27 (SUTURE) ×3
SUT VIC AB 3-0 SH 27X BRD (SUTURE) ×1 IMPLANT
SUT VIC AB 4-0 PS2 27 (SUTURE) ×3 IMPLANT
SYR 20CC LL (SYRINGE) ×3 IMPLANT
SYRINGE 10CC LL (SYRINGE) ×3 IMPLANT
SYRINGE CONTROL L 12CC (SYRINGE) ×3 IMPLANT
SYRINGE CONTROL LL 12CC (SYRINGE) ×1 IMPLANT

## 2013-08-24 NOTE — Progress Notes (Addendum)
PROGRESS NOTE  Jonathan Goodwin [id number]:[id number] DOB: 08/01/1945 DOA: 08/16/2013 PCP: [name], FNP  Summary: 68 year old man diagnosed with submassive pulmonary embolism with right ventricular strain, left lower extremity DVT, possible healthcare associated pneumonia 5/29, treated [name] who was rehospitalized 6/24-6/28 for suspected recurrent pneumonia. Imaging at that time suggested hepatic metastatic disease. On discharge plans were made for outpatient liver biopsy to further evaluate.  He presented to the emergency Department 7/5 with fever, recurrent cough and shortness of breath and was admitted for treatment of healthcare associated pneumonia and pleural effusion. He was seen by oncology and recommendations were to obtain thoracentesis, some fluid for cytology and obtain ultrasound-guided biopsy of the liver at a later date if nondiagnostic.  Assessment/Plan: 1. Acute hypoxic respiratory failure. Stable on 4 L. Multifactorial, newly diagnosed lung cancer, submassive pulmonary embolism, suspected COPD, healthcare associated pneumonia. 2. Stage IV NSCLC diagnosed by thoracentesis cytology. Port placement today, outpatient followup with oncology for chemotherapy. 3. Healthcare associated pneumonia MRSA, pseudomonas aeruginosa with sepsis on admission. Remains clinically stable.. 4. Right pleural effusion, exudative. Status post thoracentesis 7/7.  5. Tiny right apical pneumothorax post procedure thoracentesis. Asymptomatic. 6. Thrombocytopenia. Resolved. Secondary to infection. 7. Submassive pulmonary embolism with right ventricular strain diagnosed 5/29. Resume Xarelto on discharge. 8. Suspected COPD. remained stable. 9. Tobacco dependence. Recommend cessation.   Appears stable. Treated with Kayexalate earlier for hyperkalemia, repeat basic metabolic panel now. Hopefully can proceed with port insertion today and then discharged home on oral antibiotics.  Change to doxycycline and  Levaquin oral.  Home health, home oxygen, hospital bed.  Discuss restarting Xarelto with surgery  Discussed in detail with wife at bedside.  Addendum 1700 discussed with Dr. Arnoldo Morale and Dr. Thornton Papas, large recurrent pleural effusion. Plan thoracentesis in AM and then discharge. Ok for Lovenox full dose tonight per Dr. Arnoldo Morale.  Code Status: full code DVT prophylaxis: Heparin Family Communication:  Disposition Plan: home with [facility]  Jonathan Hodgkins, MD  [facility]  Pager [phone] If 7PM-7AM, please contact night-coverage at [email or web address], password [facility] 08/24/2013, 11:28 AM  LOS: 8 days   Consultants:  Oncology   Pulmonology  General surgery  Speech therapy: Regular, thin liquid  Physical therapy: home health.  Procedures:  7/11. Ultrasound guided thoracentesis with removal of 800 mL of bloody colored fluid.  Antibiotics:  Doxycycline 7/13  >> 7/19  Levaquin 7/13  >> 7/19  Vancomycin 7/6 >> 7/13  Cefepime 7/11 >> 7/13  Aztreonam 7/6>> 7/11  HPI/Subjective: Overall feeling better less pain, breathing better. Wants to go home today.  Objective: Filed Vitals:   08/23/13 1947 08/23/13 1957 08/24/13 0400 08/24/13 0739  BP:  108/74 128/85 115/71  Pulse:  93 77 76  Temp:  98.1 F (36.7 C) 98 F (36.7 C)   TempSrc:  Oral Oral   Resp:  20 20   Height:      Weight:      SpO2: 91% 94% 95% 96%    Intake/Output Summary (Last 24 hours) at 08/24/13 1128 Last data filed at 08/24/13 0859  Gross per 24 hour  Intake   1330 ml  Output   2075 ml  Net   -745 ml     Filed Weights   08/16/13 1451  Weight: 77.111 kg (170 lb)    Exam:  Afebrile, stable hypoxia. Gen. Appears calm and comfortable. Nontoxic. Psych. Alert. Grossly normal mentation. Speech fluent and clear. Cardiovascular. Regular rate and rhythm. No murmur, rub gallop.  Respiratory. Clear to auscultation bilaterally.  No wheezes, rales or rhonchi. Normal respiratory effort  Data  Reviewed: I/O: Excellent urine output Potassium 6.0. BUN normal. Creatinine normal.  Scheduled Meds: . antiseptic oral rinse  15 mL Mouth Rinse BID  . budesonide-formoterol  2 puff Inhalation BID  . doxycycline  100 mg Oral Q12H  . feeding supplement (ENSURE COMPLETE)  237 mL Oral BID BM  . folic acid  1 mg Oral Daily  . levofloxacin  750 mg Oral Daily  . multivitamin with minerals  1 tablet Oral Daily  . polyethylene glycol  17 g Oral BID  . sodium chloride  500 mL Intravenous Once  . thiamine  100 mg Oral Daily   Continuous Infusions:    Principal Problem:   HCAP (healthcare-associated pneumonia) Active Problems:   Pulmonary embolism   Lung nodule   COPD (chronic obstructive pulmonary disease)-PFT pending    Pneumonia   Stage IV NSCLC right lung

## 2013-08-24 NOTE — Progress Notes (Signed)
Pt NPO. Explained NPO status to pt. Pt verbalized understanding.

## 2013-08-24 NOTE — Progress Notes (Signed)
Patient's room air saturation at rest 87%,oxygen reapplied at 4 liters nasal canula.Will continue to monitor patient.Wife at the bedside.

## 2013-08-24 NOTE — Anesthesia Postprocedure Evaluation (Signed)
  Anesthesia Post-op Note  Patient: Jonathan Goodwin  Procedure(s) Performed: Procedure(s): INSERTION PORT-A-CATH LEFT SUBCLAVIAN (Left)  Patient Location: PACU  Anesthesia Type:MAC  Level of Consciousness: awake  Airway and Oxygen Therapy: Patient Spontanous Breathing and Patient connected to nasal cannula oxygen  Post-op Pain: none  Post-op Assessment: Post-op Vital signs reviewed, Patient's Cardiovascular Status Stable, Respiratory Function Stable and Patent Airway  Post-op Vital Signs: Reviewed and stable  Last Vitals:  Filed Vitals:   08/24/13 1423  BP: 100/72  Pulse: 90  Temp: 36.9 C  Resp: 17    Complications: No apparent anesthesia complications

## 2013-08-24 NOTE — Discharge Instructions (Signed)

## 2013-08-24 NOTE — Progress Notes (Signed)
Heparin drip stopped now.

## 2013-08-24 NOTE — Transfer of Care (Signed)
Immediate Anesthesia Transfer of Care Note  Patient: Jonathan Goodwin  Procedure(s) Performed: Procedure(s): INSERTION PORT-A-CATH LEFT SUBCLAVIAN (Left)  Patient Location: PACU  Anesthesia Type:MAC  Level of Consciousness: awake and patient cooperative  Airway & Oxygen Therapy: Patient Spontanous Breathing and Patient connected to nasal cannula oxygen  Post-op Assessment: Report given to PACU RN, Post -op Vital signs reviewed and stable and Patient moving all extremities X 4  Post vital signs: Reviewed and stable  Complications: No apparent anesthesia complications

## 2013-08-24 NOTE — Op Note (Signed)
Patient:  ILYAAS MUSTO  DOB:  March 03, 1945  MRN:  [id number]   Preop Diagnosis:  Lung carcinoma, need for central venous access  Postop Diagnosis:  Same  Procedure:  Port-A-Cath insertion  Surgeon:  Aviva Signs, M.D.  Anes:  MAC  Indications:  Patient is a 68 year old white male who presents for a Port-A-Cath insertion. He has lung carcinoma and is about to undergo chemotherapy. The risks and benefits of the procedure including bleeding, infection, and pneumothorax were fully explained to the patient, who gave informed consent.  Procedure note:  The patient was placed in the Trendelenburg position after the left upper chest was prepped and draped using usual sterile technique with DuraPrep. Surgical site confirmation was performed. 1% Xylocaine was used for local anesthesia.  A transverse incision was made inferior to the left clavicle. Subcutaneous pocket was then formed. A needle is advanced into the left subclavian vein using the Seldinger technique without difficulty. A guidewire was then advanced into the right atrium under digital fluoroscopy. An introducer and peel-away sheath were placed over the guidewire. Guidewire was removed and the catheter was inserted through the peel-away sheath.  The peel-away sheath was removed and the catheter attached to the port and the port placed in subcutaneous cutaneous pocket. Adequate positioning was confirmed by fluoroscopy. Good backflow was noted in the port. The port was flushed with heparin flush. The subcutaneous layer was reapproximated using 3-0 Vicryl interrupted suture. The skin was closed using 4-0 Vicryl subcuticular suture. Dermabond was then applied.  All tape and needle counts were correct at the end of the procedure. Patient was transferred to PACU in stable condition. A chest x-ray will be performed at that time.  Complications:  None  EBL:  Minimal  Specimen:  None

## 2013-08-24 NOTE — Anesthesia Preprocedure Evaluation (Signed)
Anesthesia Evaluation  Patient identified by MRN, date of birth, ID band Patient awake    Reviewed: Allergy & Precautions, H&P , NPO status , Patient's Chart, lab work & pertinent test results  Airway Mallampati: II TM Distance: >3 FB     Dental  (+) Lower Dentures, Upper Dentures   Pulmonary pneumonia -, COPDCurrent Smoker, former smoker,  breath sounds clear to auscultation        Cardiovascular + Peripheral Vascular Disease and DVT negative cardio ROS  Rhythm:Regular Rate:Normal     Neuro/Psych    GI/Hepatic PUD,   Endo/Other    Renal/GU Renal disease     Musculoskeletal   Abdominal   Peds  Hematology   Anesthesia Other Findings   Reproductive/Obstetrics                           Anesthesia Physical Anesthesia Plan  ASA: IV  Anesthesia Plan: MAC   Post-op Pain Management:    Induction: Intravenous  Airway Management Planned: Simple Face Mask  Additional Equipment:   Intra-op Plan:   Post-operative Plan:   Informed Consent: I have reviewed the patients History and Physical, chart, labs and discussed the procedure including the risks, benefits and alternatives for the proposed anesthesia with the patient or authorized representative who has indicated his/her understanding and acceptance.     Plan Discussed with:   Anesthesia Plan Comments:         Anesthesia Quick Evaluation

## 2013-08-24 NOTE — Progress Notes (Signed)
INITIAL NUTRITION ASSESSMENT  DOCUMENTATION CODES Per approved criteria  -Not Applicable   INTERVENTION: Recommend limit high potassium foods such as: Melon, oranges, bananas potatoes and dry beans.  Ensure Complete po BID, each supplement provides 350 kcal and 13 grams of protein   High protein snack daily q HS  Obtain standing weight if possible  NUTRITION DIAGNOSIS: Inadequate oral intake related to HCAP as evidenced by variable meal intake 0-100%  Goal: Pt to meet >/= 90% of their estimated nutrition needs     Monitor: Po intake, labs and wt trends    Reason for Assessment: Length of Stay  68 y.o. male  Admitting Dx: HCAP (healthcare-associated pneumonia)  ASSESSMENT: Pt has Stage IV NSCLC and will be follow up for initiation of chemotherapy after discharge. He presented with acute respiratory failure and has has multiple hospitalizations this past 6 months. According to weight hx he has severe weight loss the past month. Question accuracy of bed weight and 30# weight loss in 30 days. Pt and his spouse deny significant changes in appetite despite his acute illness and multiple hospitalizations. In fact he is complaining that it's too long between dinner and breakfast daily. His home diet is regular and he feeds himself. He is also HOH and has macular degeneration. Tray set-up provided by staff. Pt wants to go home and says he may be discharged today after his port a cath is placed. He is receiving a multivitamin and thiamine. Will add oral nutrition supplement to increase daily intake and provide snack q hs.   Height: Ht Readings from Last 1 Encounters:  08/16/13 6\' 1"  (1.854 m)    Weight: Wt Readings from Last 1 Encounters:  08/16/13 170 lb (77.111 kg)    Ideal Body Weight: 184# (83.6 kg)  % Ideal Body Weight: 92%  Wt Readings from Last 10 Encounters:  08/16/13 170 lb (77.111 kg)  08/16/13 170 lb (77.111 kg)  08/05/13 200 lb (90.719 kg)  07/24/13 201 lb  (91.173 kg)  07/13/13 192 lb 8 oz (87.317 kg)  06/30/13 200 lb (90.719 kg)  06/30/13 200 lb (90.719 kg)  06/25/13 200 lb (90.719 kg)  05/22/13 206 lb (93.441 kg)  01/05/13 214 lb (97.07 kg)    Usual Body Weight: 190-200#  % Usual Body Weight: 89%  BMI:  Body mass index is 22.43 kg/(m^2).normal range  Estimated Nutritional Needs: Kcal: 1925-2310 Protein: 92-100 gr Fluid: >1900 ml daily  Skin: intact  Diet Order: Cardiac  EDUCATION NEEDS: -No education needs identified at this time   Intake/Output Summary (Last 24 hours) at 08/24/13 1035 Last data filed at 08/24/13 0859  Gross per 24 hour  Intake   1330 ml  Output   2075 ml  Net   -745 ml    Last BM: 08/24/13  Labs:   Recent Labs Lab 08/19/13 0544 08/21/13 0504 08/24/13 0513  NA 141 137 140  K 5.2 4.9 6.0*  CL 106 103 104  CO2 23 22 29   BUN 25* 27* 18  CREATININE 1.08 1.09 1.12  CALCIUM 8.4 8.3* 8.6  GLUCOSE 113* 114* 107*    CBG (last 3)  No results found for this basename: GLUCAP,  in the last 72 hours  Scheduled Meds: . antiseptic oral rinse  15 mL Mouth Rinse BID  . budesonide-formoterol  2 puff Inhalation BID  . ceFEPime (MAXIPIME) IV  2 g Intravenous M7E  . folic acid  1 mg Oral Daily  . multivitamin with minerals  1 tablet  Oral Daily  . polyethylene glycol  17 g Oral BID  . sodium chloride  500 mL Intravenous Once  . sodium polystyrene  15 g Oral Once  . thiamine  100 mg Oral Daily  . vancomycin  750 mg Intravenous Q12H    Continuous Infusions:   Past Medical History  Diagnosis Date  . H/O renal calculi   . Urethral stricture   . Wears hearing aid     right ear  . Hearing impaired person, right   . Arthritis   . Macular degeneration   . Ulcerative colitis   . History of kidney stones   . Pulmonary embolus 07/10/2013  . DVT, lower extremity 07/10/2013    LLE  . COPD (chronic obstructive pulmonary disease)   . Prostate cancer 10/24/07    radiation therapy  . Non-small cell  cancer of right lung 08/21/2013    diagnosed by cytology from thorocentesis    Past Surgical History  Procedure Laterality Date  . Knee arthroscopy  1993    right  . Appendectomy  2010    [facility], Dr. Romona Curls  . Shoulder surgery      left, for chronic dislocation  . Cystoscopy  2010    [facility], Dr. Maryland Pink  . Urethrotomy  06/12/2011    Procedure: CYSTOSCOPY/URETHROTOMY;  Surgeon: Marissa Nestle, MD;  Location: [facility];  Service: Urology;  Laterality: N/A;  optical urethrotomy  . Cataract extraction w/phaco Left 01/15/2013    Procedure: CATARACT EXTRACTION PHACO AND INTRAOCULAR LENS PLACEMENT (IOC);  Surgeon: Tonny Branch, MD;  Location: [facility];  Service: Ophthalmology;  Laterality: Left;  CDE:22.42  . Cystoscopy with urethral dilatation N/A 06/30/2013    Procedure: CYSTOSCOPY WITH URETHRAL DILATATION;  Surgeon: Marissa Nestle, MD;  Location: [facility];  Service: Urology;  Laterality: N/A;  . Rectal exam under anesthesia N/A 06/30/2013    Procedure: RECTAL EXAM UNDER ANESTHESIA;  Surgeon: Marissa Nestle, MD;  Location: [facility];  Service: Urology;  Laterality: N/A;  . Urethrotomy N/A 06/30/2013    Procedure: CYSTOSCOPY/URETHROTOMY;  Surgeon: Marissa Nestle, MD;  Location: [facility];  Service: Urology;  Laterality: N/A;  . Cystoscopy 2015      Colman Cater MS,RD,CSG,LDN Office: [phone] Pager: [phone]

## 2013-08-24 NOTE — Anesthesia Procedure Notes (Signed)
Procedure Name: MAC Date/Time: 08/24/2013 1:49 PM Performed by: Vista Deck Pre-anesthesia Checklist: Patient identified, Emergency Drugs available, Suction available, Timeout performed and Patient being monitored Patient Re-evaluated:Patient Re-evaluated prior to inductionOxygen Delivery Method: Non-rebreather mask

## 2013-08-25 ENCOUNTER — Inpatient Hospital Stay (HOSPITAL_COMMUNITY): Payer: Medicare Other

## 2013-08-25 ENCOUNTER — Encounter (HOSPITAL_COMMUNITY): Payer: Self-pay

## 2013-08-25 ENCOUNTER — Other Ambulatory Visit (HOSPITAL_COMMUNITY): Payer: Self-pay | Admitting: Oncology

## 2013-08-25 DIAGNOSIS — K7689 Other specified diseases of liver: Secondary | ICD-10-CM

## 2013-08-25 DIAGNOSIS — C3491 Malignant neoplasm of unspecified part of right bronchus or lung: Secondary | ICD-10-CM

## 2013-08-25 MED ORDER — DOXYCYCLINE HYCLATE 100 MG PO TABS: 100.0000 mg | ORAL_TABLET | Freq: Two times a day (BID) | ORAL | Status: DC

## 2013-08-25 MED ORDER — FOLIC ACID 1 MG PO TABS: 1.0000 mg | ORAL_TABLET | Freq: Every day | ORAL | Status: DC

## 2013-08-25 MED ORDER — DEXAMETHASONE 4 MG PO TABS: ORAL_TABLET | ORAL | Status: DC

## 2013-08-25 MED ORDER — LIDOCAINE-PRILOCAINE 2.5-2.5 % EX CREA: TOPICAL_CREAM | CUTANEOUS | Status: DC

## 2013-08-25 MED ORDER — LEVOFLOXACIN 750 MG PO TABS: 750.0000 mg | ORAL_TABLET | Freq: Every day | ORAL | Status: DC

## 2013-08-25 MED ORDER — PROCHLORPERAZINE MALEATE 10 MG PO TABS: 10.0000 mg | ORAL_TABLET | Freq: Four times a day (QID) | ORAL | Status: DC | PRN

## 2013-08-25 MED ORDER — ONDANSETRON HCL 8 MG PO TABS: ORAL_TABLET | ORAL | Status: DC

## 2013-08-25 MED ORDER — OXYCODONE HCL 5 MG PO TABS: 5.0000 mg | ORAL_TABLET | ORAL | Status: DC | PRN

## 2013-08-25 MED ORDER — LACTATED RINGERS IV SOLN: INTRAVENOUS | Status: DC

## 2013-08-25 NOTE — Progress Notes (Signed)
Patient off the floor to radiology.

## 2013-08-25 NOTE — Progress Notes (Signed)
Pt sitting and SATS on RA 79%. RT Placed Pt back on 4 L [place]

## 2013-08-25 NOTE — Patient Instructions (Addendum)
[facility]   CHEMOTHERAPY INSTRUCTIONS  Pemetrexed - Bone marrow suppression, fatigue, nausea/vomiting, chest pain, and/or shortness of breath. While taking Pemetrexed, you will have to take Folic acid everyday at home. We will also give you Vit B12 injection prior to you starting Pemetrexed.Marland Kitchen You will also be responsible for taking a steroid Dexamethasone while taking Pemetrexed. You will take it the day before, day of, and day after Pemetrexed. This is to decrease your incidence of developing a skin rash. You will receive this medication every 21 days via port-a-cath.  Carboplatin - this medication can be hard on your kidneys - this is why we need you to drink 64 oz of fluid (preferably water/decaff fluids) 2 days prior to chemo and for up to 4-5 days after chemo. Drink more if you can. This will help to keep your kidneys flushed. This can cause mild hair loss, lower your platelets (which make your blood clot), lower your white blood cells (fight infection), and cause nausea/vomiting. You will receive this medication every 21 days via port-a-cath.  Avastin - this treatment is called an anti-angiogenic agent. Avastin is thought to work by causing the blood vessels to shrink away from the tumor, blocking the supply of nutrients/oxygen that the tumor needs to survive. It may also interfere with the growth of new blood vessels, causing the tumor to starve. Side Effects: Nosebleeds, high blood pressure, protein in the urine, weakness, diarrhea. You will receive this medication every 21 days via port-a-cath.   POTENTIAL SIDE EFFECTS OF TREATMENT: Increased Susceptibility to Infection, Vomiting, Constipation, Hair Thinning, Changes in Character of Skin and Nails (brittleness, dryness,etc.), Bone Marrow Suppression, Nausea, Diarrhea, Sun Sensitivity and Mouth Sores   EDUCATIONAL MATERIALS GIVEN AND REVIEWED: Chemotherapy and You booklet Specific Instructions Sheets:  Pemetrexed, Carboplatin, Avastin, Vitamin W40, Folic Acid, Dexamethasone, Zofran, Compazine, EMLA cream   SELF CARE ACTIVITIES WHILE ON CHEMOTHERAPY: Increase your fluid intake 48 hours prior to treatment and drink at least 2 quarts per day after treatment., No alcohol intake., No aspirin or other medications unless approved by your oncologist., Eat foods that are light and easy to digest., Eat foods at cold or room temperature., No fried, fatty, or spicy foods immediately before or after treatment., Have teeth cleaned professionally before starting treatment. Keep dentures and partial plates clean., Use soft toothbrush and do not use mouthwashes that contain alcohol. Biotene is a good mouthwash that is available at most pharmacies or may be ordered by calling [phone]., Use warm salt water gargles (1 teaspoon salt per 1 quart warm water) before and after meals and at bedtime. Or you may rinse with 2 tablespoons of three -percent hydrogen peroxide mixed in eight ounces of water., Always use sunscreen with SPF (Sun Protection Factor) of 30 or higher., Use your nausea medication as directed to prevent nausea. and Use your stool softener or laxative as directed to prevent constipation.  Please wash your hands for at least 30 seconds using warm soapy water. Handwashing is the #1 way to prevent the spread of germs. Stay away from sick people or people who are getting over a cold. If you develop respiratory systems such as green/yellow mucus production or productive cough or persistent cough let us know and we will see if you need an antibiotic. It is a good idea to keep a pair of gloves on when going into grocery stores/[employer] to decrease your risk of coming into contact with germs on the carts, etc. Carry  alcohol hand gel with you at all times and use it frequently if out in public. All foods need to be cooked thoroughly. No raw foods. No medium or undercooked meats, eggs. If your food is cooked medium  well, it does not need to be hot pink or saturated with bloody liquid at all. Vegetables and fruits need to be washed/rinsed under the faucet with a dish detergent before being consumed. You can eat raw fruits and vegetables unless we tell you otherwise but it would be best if you cooked them or bought frozen. Do not eat off of salad bars or hot bars unless you really trust the cleanliness of the restaurant. If you need dental work, please let us know before you go for your appointment so that we can coordinate the best possible time for you in regards to your chemo regimen. You need to also let your dentist know that you are actively taking chemo. We may need to do labs prior to your dental appointment. We also want your bowels moving at least every other day. If this is not happening, we need to know so that we can get you on a bowel regimen to help you go.    MEDICATIONS: You have been given prescriptions for the following medications:  Dexamethasone 4mg  tablet. The day before, day of, and day after chemo take 2 tabs in am and 2 tabs in pm.   Zofran 8mg  tablet. Starting the day after chemo, take 1 tab in am and 1 tab in pm x 2 days. Then may take 1 tab two times a day IF needed for nausea/vomiting.   Compazine 10mg  tablet. May take 1 tablet every 6 hours if needed for nausea/vomiting.   Folic acid 1mg  tablet. Take 1 tablet daily. When your prescription runs out get it refilled and take it until all the refills run out.   EMLA cream. Apply a quarter size amount to port site 1 hour prior to chemo. Do not rub in. Cover with plastic wrap.    Over-the-Counter Meds:  Colace - this is a stool softener. Take 100mg  capsule 2-6 times a day as needed. If you have to take more than 6 capsules of Colace a day call the [facility].  Senna - this is a mild laxative used to treat mild constipation. May take 2 tabs by mouth daily or up to twice a day as needed for mild constipation.  Milk of Magnesia -  this is a laxative used to treat moderate to severe constipation. May take 2-4 tablespoons every 8 hours as needed. May increase to 8 tablespoons x 1 dose and if no bowel movement call the [facility].  Imodium - this is for diarrhea. Take 2 tabs after 1st loose stool and then 1 tab after each loose stool until you go a total of 12 hours without a loose stool. Call [facility] if loose stools continue.   SYMPTOMS TO REPORT AS SOON AS POSSIBLE AFTER TREATMENT:  FEVER GREATER THAN 100.5 F  CHILLS WITH OR WITHOUT FEVER  NAUSEA AND VOMITING THAT IS NOT CONTROLLED WITH YOUR NAUSEA MEDICATION  UNUSUAL SHORTNESS OF BREATH  UNUSUAL BRUISING OR BLEEDING  TENDERNESS IN MOUTH AND THROAT WITH OR WITHOUT PRESENCE OF ULCERS  URINARY PROBLEMS  BOWEL PROBLEMS  UNUSUAL RASH    Wear comfortable clothing and clothing appropriate for easy access to any Portacath or PICC line. Let us know if there is anything that we can do to make your therapy better!  I have been informed and understand all of the instructions given to me and have received a copy. I have been instructed to call the clinic [phone] or my family physician as soon as possible for continued medical care, if indicated. I do not have any more questions at this time but understand that I may call the [facility] or the Patient Navigator at [phone] during office hours should I have questions or need assistance in obtaining follow-up care.      _________________________________________      _______________     __________ Signature of Patient or Authorized Representative        Date                            Time      _________________________________________ Nurse's Signature      Pemetrexed injection What is this medicine? PEMETREXED (PEM e TREX ed) is a chemotherapy drug. This medicine affects cells that are rapidly growing, such as cancer cells and cells in your mouth and stomach. It is usually  used to treat lung cancers like non-small cell lung cancer and mesothelioma. It may also be used to treat other cancers. This medicine may be used for other purposes; ask your health care provider or pharmacist if you have questions. COMMON BRAND NAME(S): Alimta What should I tell my health care provider before I take this medicine? They need to know if you have any of these conditions: -if you frequently drink alcohol containing beverages -infection (especially a virus infection such as chickenpox, cold sores, or herpes) -kidney disease -liver disease -low blood counts, like low platelets, red bloods, or white blood cells -an unusual or allergic reaction to pemetrexed, mannitol, other medicines, foods, dyes, or preservatives -pregnant or trying to get pregnant -breast-feeding How should I use this medicine? This drug is given as an infusion into a vein. It is administered in a hospital or clinic by a specially trained health care professional. Talk to your pediatrician regarding the use of this medicine in children. Special care may be needed. Overdosage: If you think you have taken too much of this medicine contact a poison control center or emergency room at once. NOTE: This medicine is only for you. Do not share this medicine with others. What if I miss a dose? It is important not to miss your dose. Call your doctor or health care professional if you are unable to keep an appointment. What may interact with this medicine? -aspirin and aspirin-like medicines -medicines to increase blood counts like filgrastim, pegfilgrastim, sargramostim -methotrexate -NSAIDS, medicines for pain and inflammation, like ibuprofen or naproxen -probenecid -pyrimethamine -vaccines Talk to your doctor or health care professional before taking any of these medicines: -acetaminophen -aspirin -ibuprofen -ketoprofen -naproxen This list may not describe all possible interactions. Give your health care  provider a list of all the medicines, herbs, non-prescription drugs, or dietary supplements you use. Also tell them if you smoke, drink alcohol, or use illegal drugs. Some items may interact with your medicine. What should I watch for while using this medicine? Visit your doctor for checks on your progress. This drug may make you feel generally unwell. This is not uncommon, as chemotherapy can affect healthy cells as well as cancer cells. Report any side effects. Continue your course of treatment even though you feel ill unless your doctor tells you to stop. In some cases, you may be given additional medicines to help  with side effects. Follow all directions for their use. Call your doctor or health care professional for advice if you get a fever, chills or sore throat, or other symptoms of a cold or flu. Do not treat yourself. This drug decreases your body's ability to fight infections. Try to avoid being around people who are sick. This medicine may increase your risk to bruise or bleed. Call your doctor or health care professional if you notice any unusual bleeding. Be careful brushing and flossing your teeth or using a toothpick because you may get an infection or bleed more easily. If you have any dental work done, tell your dentist you are receiving this medicine. Avoid taking products that contain aspirin, acetaminophen, ibuprofen, naproxen, or ketoprofen unless instructed by your doctor. These medicines may hide a fever. Call your doctor or health care professional if you get diarrhea or mouth sores. Do not treat yourself. To protect your kidneys, drink water or other fluids as directed while you are taking this medicine. Men and women must use effective birth control while taking this medicine. You may also need to continue using effective birth control for a time after stopping this medicine. Do not become pregnant while taking this medicine. Tell your doctor right away if you think that you or  your partner might be pregnant. There is a potential for serious side effects to an unborn child. Talk to your health care professional or pharmacist for more information. Do not breast-feed an infant while taking this medicine. This medicine may lower sperm counts. What side effects may I notice from receiving this medicine? Side effects that you should report to your doctor or health care professional as soon as possible: -allergic reactions like skin rash, itching or hives, swelling of the face, lips, or tongue -low blood counts - this medicine may decrease the number of white blood cells, red blood cells and platelets. You may be at increased risk for infections and bleeding. -signs of infection - fever or chills, cough, sore throat, pain or difficulty passing urine -signs of decreased platelets or bleeding - bruising, pinpoint red spots on the skin, black, tarry stools, blood in the urine -signs of decreased red blood cells - unusually weak or tired, fainting spells, lightheadedness -breathing problems, like a dry cough -changes in emotions or moods -chest pain -confusion -diarrhea -high blood pressure -mouth or throat sores or ulcers -pain, swelling, warmth in the leg -pain on swallowing -swelling of the ankles, feet, hands -trouble passing urine or change in the amount of urine -vomiting -yellowing of the eyes or skin Side effects that usually do not require medical attention (report to your doctor or health care professional if they continue or are bothersome): -hair loss -loss of appetite -nausea -stomach upset This list may not describe all possible side effects. Call your doctor for medical advice about side effects. You may report side effects to FDA at 1-800-FDA-1088. Where should I keep my medicine? This drug is given in a hospital or clinic and will not be stored at home. NOTE: This sheet is a summary. It may not cover all possible information. If you have questions about  this medicine, talk to your doctor, pharmacist, or health care provider.  2015, [employer]vier/[employer] Standard. (2007-09-02 13:24:03) Carboplatin injection What is this medicine? CARBOPLATIN (KAR boe pla tin) is a chemotherapy drug. It targets fast dividing cells, like cancer cells, and causes these cells to die. This medicine is used to treat ovarian cancer and many other cancers. This  medicine may be used for other purposes; ask your health care provider or pharmacist if you have questions. COMMON BRAND NAME(S): Paraplatin What should I tell my health care provider before I take this medicine? They need to know if you have any of these conditions: -blood disorders -hearing problems -kidney disease -recent or ongoing radiation therapy -an unusual or allergic reaction to carboplatin, cisplatin, other chemotherapy, other medicines, foods, dyes, or preservatives -pregnant or trying to get pregnant -breast-feeding How should I use this medicine? This drug is usually given as an infusion into a vein. It is administered in a hospital or clinic by a specially trained health care professional. Talk to your pediatrician regarding the use of this medicine in children. Special care may be needed. Overdosage: If you think you have taken too much of this medicine contact a poison control center or emergency room at once. NOTE: This medicine is only for you. Do not share this medicine with others. What if I miss a dose? It is important not to miss a dose. Call your doctor or health care professional if you are unable to keep an appointment. What may interact with this medicine? -medicines for seizures -medicines to increase blood counts like filgrastim, pegfilgrastim, sargramostim -some antibiotics like amikacin, gentamicin, neomycin, streptomycin, tobramycin -vaccines Talk to your doctor or health care professional before taking any of these  medicines: -acetaminophen -aspirin -ibuprofen -ketoprofen -naproxen This list may not describe all possible interactions. Give your health care provider a list of all the medicines, herbs, non-prescription drugs, or dietary supplements you use. Also tell them if you smoke, drink alcohol, or use illegal drugs. Some items may interact with your medicine. What should I watch for while using this medicine? Your condition will be monitored carefully while you are receiving this medicine. You will need important blood work done while you are taking this medicine. This drug may make you feel generally unwell. This is not uncommon, as chemotherapy can affect healthy cells as well as cancer cells. Report any side effects. Continue your course of treatment even though you feel ill unless your doctor tells you to stop. In some cases, you may be given additional medicines to help with side effects. Follow all directions for their use. Call your doctor or health care professional for advice if you get a fever, chills or sore throat, or other symptoms of a cold or flu. Do not treat yourself. This drug decreases your body's ability to fight infections. Try to avoid being around people who are sick. This medicine may increase your risk to bruise or bleed. Call your doctor or health care professional if you notice any unusual bleeding. Be careful brushing and flossing your teeth or using a toothpick because you may get an infection or bleed more easily. If you have any dental work done, tell your dentist you are receiving this medicine. Avoid taking products that contain aspirin, acetaminophen, ibuprofen, naproxen, or ketoprofen unless instructed by your doctor. These medicines may hide a fever. Do not become pregnant while taking this medicine. Women should inform their doctor if they wish to become pregnant or think they might be pregnant. There is a potential for serious side effects to an unborn child. Talk to  your health care professional or pharmacist for more information. Do not breast-feed an infant while taking this medicine. What side effects may I notice from receiving this medicine? Side effects that you should report to your doctor or health care professional as soon as possible: -allergic  reactions like skin rash, itching or hives, swelling of the face, lips, or tongue -signs of infection - fever or chills, cough, sore throat, pain or difficulty passing urine -signs of decreased platelets or bleeding - bruising, pinpoint red spots on the skin, black, tarry stools, nosebleeds -signs of decreased red blood cells - unusually weak or tired, fainting spells, lightheadedness -breathing problems -changes in hearing -changes in vision -chest pain -high blood pressure -low blood counts - This drug may decrease the number of white blood cells, red blood cells and platelets. You may be at increased risk for infections and bleeding. -nausea and vomiting -pain, swelling, redness or irritation at the injection site -pain, tingling, numbness in the hands or feet -problems with balance, talking, walking -trouble passing urine or change in the amount of urine Side effects that usually do not require medical attention (report to your doctor or health care professional if they continue or are bothersome): -hair loss -loss of appetite -metallic taste in the mouth or changes in taste This list may not describe all possible side effects. Call your doctor for medical advice about side effects. You may report side effects to FDA at 1-800-FDA-1088. Where should I keep my medicine? This drug is given in a hospital or clinic and will not be stored at home. NOTE: This sheet is a summary. It may not cover all possible information. If you have questions about this medicine, talk to your doctor, pharmacist, or health care provider.  2015, [employer]vier/Gold Standard. (2007-05-06 14:38:05) Bevacizumab injection What is  this medicine? BEVACIZUMAB (be va SIZ yoo mab) is a chemotherapy drug. It targets a protein found in many cancer cell types, and halts cancer growth. This drug treats many cancers including non-small cell lung cancer, ovarian cancer, cervical cancer, and colon or rectal cancer. It is usually given with other chemotherapy drugs. This medicine may be used for other purposes; ask your health care provider or pharmacist if you have questions. COMMON BRAND NAME(S): Avastin What should I tell my health care provider before I take this medicine? They need to know if you have any of these conditions: -blood clots -heart disease, including heart failure, heart attack, or chest pain (angina) -high blood pressure -infection (especially a virus infection such as chickenpox, cold sores, or herpes) -kidney disease -lung disease -prior chemotherapy with doxorubicin, daunorubicin, epirubicin, or other anthracycline type chemotherapy agents -recent or ongoing radiation therapy -recent surgery -stroke -an unusual or allergic reaction to bevacizumab, hamster proteins, mouse proteins, other medicines, foods, dyes, or preservatives -pregnant or trying to get pregnant -breast-feeding How should I use this medicine? This medicine is for infusion into a vein. It is given by a health care professional in a hospital or clinic setting. Talk to your pediatrician regarding the use of this medicine in children. Special care may be needed. Overdosage: If you think you have taken too much of this medicine contact a poison control center or emergency room at once. NOTE: This medicine is only for you. Do not share this medicine with others. What if I miss a dose? It is important not to miss your dose. Call your doctor or health care professional if you are unable to keep an appointment. What may interact with this medicine? Interactions are not expected. This list may not describe all possible interactions. Give your  health care provider a list of all the medicines, herbs, non-prescription drugs, or dietary supplements you use. Also tell them if you smoke, drink alcohol, or use illegal drugs.  Some items may interact with your medicine. What should I watch for while using this medicine? Your condition will be monitored carefully while you are receiving this medicine. You will need important blood work and urine testing done while you are taking this medicine. During your treatment, let your health care professional know if you have any unusual symptoms, such as difficulty breathing. This medicine may rarely cause 'gastrointestinal perforation' (holes in the stomach, intestines or colon), a serious side effect requiring surgery to repair. This medicine should be started at least 28 days following major surgery and the site of the surgery should be totally healed. Check with your doctor before scheduling dental work or surgery while you are receiving this treatment. Talk to your doctor if you have recently had surgery or if you have a wound that has not healed. Do not become pregnant while taking this medicine. Women should inform their doctor if they wish to become pregnant or think they might be pregnant. There is a potential for serious side effects to an unborn child. Talk to your health care professional or pharmacist for more information. Do not breast-feed an infant while taking this medicine. This medicine has caused ovarian failure in some women. This medicine may interfere with the ability to have a child. You should talk to your doctor or health care professional if you are concerned about your fertility. What side effects may I notice from receiving this medicine? Side effects that you should report to your doctor or health care professional as soon as possible: -allergic reactions like skin rash, itching or hives, swelling of the face, lips, or tongue -signs of infection - fever or chills, cough, sore  throat, pain or trouble passing urine -signs of decreased platelets or bleeding - bruising, pinpoint red spots on the skin, black, tarry stools, nosebleeds, blood in the urine -breathing problems -changes in vision -chest pain -confusion -jaw pain, especially after dental work -mouth sores -seizures -severe abdominal pain -severe headache -sudden numbness or weakness of the face, arm or leg -swelling of legs or ankles -symptoms of a stroke: change in mental awareness, inability to talk or move one side of the body (especially in patients with lung cancer) -trouble passing urine or change in the amount of urine -trouble speaking or understanding -trouble walking, dizziness, loss of balance or coordination Side effects that usually do not require medical attention (report to your doctor or health care professional if they continue or are bothersome): -constipation -diarrhea -dry skin -headache -loss of appetite -nausea, vomiting This list may not describe all possible side effects. Call your doctor for medical advice about side effects. You may report side effects to FDA at 1-800-FDA-1088. Where should I keep my medicine? This drug is given in a hospital or clinic and will not be stored at home. NOTE: This sheet is a summary. It may not cover all possible information. If you have questions about this medicine, talk to your doctor, pharmacist, or health care provider.  2015, Elsevier/Gold Standard. (2012-12-30 11:38:34) Dexamethasone tablets What is this medicine? DEXAMETHASONE (dex a METH a sone) is a corticosteroid. It is commonly used to treat inflammation of the skin, joints, lungs, and other organs. Common conditions treated include asthma, allergies, and arthritis. It is also used for other conditions, such as blood disorders and diseases of the adrenal glands. This medicine may be used for other purposes; ask your health care provider or pharmacist if you have questions. COMMON  BRAND NAME(S): Decadron, DexPak Sterling Big, DexPak  TaperPak, Zema-Pak What should I tell my health care provider before I take this medicine? They need to know if you have any of these conditions: -Cushing's syndrome -diabetes -glaucoma -heart problems or disease -high blood pressure -infection like herpes, measles, tuberculosis, or chickenpox -kidney disease -liver disease -mental problems -myasthenia gravis -osteoporosis -previous heart attack -seizures -stomach, ulcer or intestine disease including colitis and diverticulitis -thyroid problem -an unusual or allergic reaction to dexamethasone, corticosteroids, other medicines, lactose, foods, dyes, or preservatives -pregnant or trying to get pregnant -breast-feeding How should I use this medicine? Take this medicine by mouth with a drink of water. Follow the directions on the prescription label. Take it with food or milk to avoid stomach upset. If you are taking this medicine once a day, take it in the morning. Do not take more medicine than you are told to take. Do not suddenly stop taking your medicine because you may develop a severe reaction. Your doctor will tell you how much medicine to take. If your doctor wants you to stop the medicine, the dose may be slowly lowered over time to avoid any side effects. Talk to your pediatrician regarding the use of this medicine in children. Special care may be needed. Patients over 50 years old may have a stronger reaction and need a smaller dose. Overdosage: If you think you have taken too much of this medicine contact a poison control center or emergency room at once. NOTE: This medicine is only for you. Do not share this medicine with others. What if I miss a dose? If you miss a dose, take it as soon as you can. If it is almost time for your next dose, talk to your doctor or health care professional. You may need to miss a dose or take an extra dose. Do not take double or extra doses  without advice. What may interact with this medicine? Do not take this medicine with any of the following medications: -mifepristone, RU-486 -vaccines This medicine may also interact with the following medications: -amphotericin B -antibiotics like clarithromycin, erythromycin, and troleandomycin -aspirin and aspirin-like drugs -barbiturates like phenobarbital -carbamazepine -cholestyramine -cholinesterase inhibitors like donepezil, galantamine, rivastigmine, and tacrine -cyclosporine -digoxin -diuretics -ephedrine -male hormones, like estrogens or progestins and birth control pills -indinavir -isoniazid -ketoconazole -medicines for diabetes -medicines that improve muscle tone or strength for conditions like myasthenia gravis -NSAIDs, medicines for pain and inflammation, like ibuprofen or naproxen -phenytoin -rifampin -thalidomide -warfarin This list may not describe all possible interactions. Give your health care provider a list of all the medicines, herbs, non-prescription drugs, or dietary supplements you use. Also tell them if you smoke, drink alcohol, or use illegal drugs. Some items may interact with your medicine. What should I watch for while using this medicine? Visit your doctor or health care professional for regular checks on your progress. If you are taking this medicine over a prolonged period, carry an identification card with your name and address, the type and dose of your medicine, and your doctor's name and address. This medicine may increase your risk of getting an infection. Stay away from people who are sick. Tell your doctor or health care professional if you are around anyone with measles or chickenpox. If you are going to have surgery, tell your doctor or health care professional that you have taken this medicine within the last twelve months. Ask your doctor or health care professional about your diet. You may need to lower the amount of salt you  eat. The  medicine can increase your blood sugar. If you are a diabetic check with your doctor if you need help adjusting the dose of your diabetic medicine. What side effects may I notice from receiving this medicine? Side effects that you should report to your doctor or health care professional as soon as possible: -allergic reactions like skin rash, itching or hives, swelling of the face, lips, or tongue -changes in vision -fever, sore throat, sneezing, cough, or other signs of infection, wounds that will not heal -increased thirst -mental depression, mood swings, mistaken feelings of self importance or of being mistreated -pain in hips, back, ribs, arms, shoulders, or legs -redness, blistering, peeling or loosening of the skin, including inside the mouth -trouble passing urine or change in the amount of urine -swelling of feet or lower legs -unusual bleeding or bruising Side effects that usually do not require medical attention (report to your doctor or health care professional if they continue or are bothersome): -headache -nausea, vomiting -skin problems, acne, thin and shiny skin -weight gain This list may not describe all possible side effects. Call your doctor for medical advice about side effects. You may report side effects to FDA at 1-800-FDA-1088. Where should I keep my medicine? Keep out of the reach of children. Store at room temperature between 20 and 25 degrees C (68 and 77 degrees F). Protect from light. Throw away any unused medicine after the expiration date. NOTE: This sheet is a summary. It may not cover all possible information. If you have questions about this medicine, talk to your doctor, pharmacist, or health care provider.  2015, Elsevier/Gold Standard. (2007-05-22 14:02:13) Ondansetron tablets What is this medicine? ONDANSETRON (on DAN se tron) is used to treat nausea and vomiting caused by chemotherapy. It is also used to prevent or treat nausea and vomiting  after surgery. This medicine may be used for other purposes; ask your health care provider or pharmacist if you have questions. COMMON BRAND NAME(S): Zofran What should I tell my health care provider before I take this medicine? They need to know if you have any of these conditions: -heart disease -history of irregular heartbeat -liver disease -low levels of magnesium or potassium in the blood -an unusual or allergic reaction to ondansetron, granisetron, other medicines, foods, dyes, or preservatives -pregnant or trying to get pregnant -breast-feeding How should I use this medicine? Take this medicine by mouth with a glass of water. Follow the directions on your prescription label. Take your doses at regular intervals. Do not take your medicine more often than directed. Talk to your pediatrician regarding the use of this medicine in children. Special care may be needed. Overdosage: If you think you have taken too much of this medicine contact a poison control center or emergency room at once. NOTE: This medicine is only for you. Do not share this medicine with others. What if I miss a dose? If you miss a dose, take it as soon as you can. If it is almost time for your next dose, take only that dose. Do not take double or extra doses. What may interact with this medicine? Do not take this medicine with any of the following medications: -apomorphine -certain medicines for fungal infections like fluconazole, itraconazole, ketoconazole, posaconazole, voriconazole -cisapride -dofetilide -dronedarone -pimozide -thioridazine -ziprasidone This medicine may also interact with the following medications: -carbamazepine -certain medicines for depression, anxiety, or psychotic disturbances -fentanyl -linezolid -MAOIs like Carbex, Eldepryl, Marplan, Nardil, and Parnate -methylene blue (injected into a vein) -other medicines that  prolong the QT interval (cause an abnormal heart  rhythm) -phenytoin -rifampicin -tramadol This list may not describe all possible interactions. Give your health care provider a list of all the medicines, herbs, non-prescription drugs, or dietary supplements you use. Also tell them if you smoke, drink alcohol, or use illegal drugs. Some items may interact with your medicine. What should I watch for while using this medicine? Check with your doctor or health care professional right away if you have any sign of an allergic reaction. What side effects may I notice from receiving this medicine? Side effects that you should report to your doctor or health care professional as soon as possible: -allergic reactions like skin rash, itching or hives, swelling of the face, lips or tongue -breathing problems -confusion -dizziness -fast or irregular heartbeat -feeling faint or lightheaded, falls -fever and chills -loss of balance or coordination -seizures -sweating -swelling of the hands or feet -tightness in the chest -tremors -unusually weak or tired Side effects that usually do not require medical attention (report to your doctor or health care professional if they continue or are bothersome): -constipation or diarrhea -headache This list may not describe all possible side effects. Call your doctor for medical advice about side effects. You may report side effects to FDA at 1-800-FDA-1088. Where should I keep my medicine? Keep out of the reach of children. Store between 2 and 30 degrees C (36 and 86 degrees F). Throw away any unused medicine after the expiration date. NOTE: This sheet is a summary. It may not cover all possible information. If you have questions about this medicine, talk to your doctor, pharmacist, or health care provider.  2015, Elsevier/Gold Standard. (2012-11-05 16:27:45) Prochlorperazine tablets What is this medicine? PROCHLORPERAZINE (proe klor PER a zeen) helps to control severe nausea and vomiting. This medicine is  also used to treat schizophrenia. It can also help patients who experience anxiety that is not due to psychological illness. This medicine may be used for other purposes; ask your health care provider or pharmacist if you have questions. COMMON BRAND NAME(S): Compazine What should I tell my health care provider before I take this medicine? They need to know if you have any of these conditions: -blood disorders or disease -dementia -liver disease or jaundice -Parkinson's disease -uncontrollable movement disorder -an unusual or allergic reaction to prochlorperazine, other medicines, foods, dyes, or preservatives -pregnant or trying to get pregnant -breast-feeding How should I use this medicine? Take this medicine by mouth with a glass of water. Follow the directions on the prescription label. Take your doses at regular intervals. Do not take your medicine more often than directed. Do not stop taking this medicine suddenly. This can cause nausea, vomiting, and dizziness. Ask your doctor or health care professional for advice. Talk to your pediatrician regarding the use of this medicine in children. Special care may be needed. While this drug may be prescribed for children as young as 2 years for selected conditions, precautions do apply. Overdosage: If you think you have taken too much of this medicine contact a poison control center or emergency room at once. NOTE: This medicine is only for you. Do not share this medicine with others. What if I miss a dose? If you miss a dose, take it as soon as you can. If it is almost time for your next dose, take only that dose. Do not take double or extra doses. What may interact with this medicine? Do not take this medicine with any of the  following medications: -amoxapine -antidepressants like citalopram, escitalopram, fluoxetine, paroxetine, and sertraline -deferoxamine -dofetilide -maprotiline -tricyclic antidepressants like amitriptyline,  clomipramine, imipramine, nortiptyline and others This medicine may also interact with the following medications: -lithium -medicines for pain -phenytoin -propranolol -warfarin This list may not describe all possible interactions. Give your health care provider a list of all the medicines, herbs, non-prescription drugs, or dietary supplements you use. Also tell them if you smoke, drink alcohol, or use illegal drugs. Some items may interact with your medicine. What should I watch for while using this medicine? Visit your doctor or health care professional for regular checks on your progress. You may get drowsy or dizzy. Do not drive, use machinery, or do anything that needs mental alertness until you know how this medicine affects you. Do not stand or sit up quickly, especially if you are an older patient. This reduces the risk of dizzy or fainting spells. Alcohol may interfere with the effect of this medicine. Avoid alcoholic drinks. This medicine can reduce the response of your body to heat or cold. Dress warm in cold weather and stay hydrated in hot weather. If possible, avoid extreme temperatures like saunas, hot tubs, very hot or cold showers, or activities that can cause dehydration such as vigorous exercise. This medicine can make you more sensitive to the sun. Keep out of the sun. If you cannot avoid being in the sun, wear protective clothing and use sunscreen. Do not use sun lamps or tanning beds/booths. Your mouth may get dry. Chewing sugarless gum or sucking hard candy, and drinking plenty of water may help. Contact your doctor if the problem does not go away or is severe. What side effects may I notice from receiving this medicine? Side effects that you should report to your doctor or health care professional as soon as possible: -blurred vision -breast enlargement in men or women -breast milk in women who are not breast-feeding -chest pain, fast or irregular heartbeat -confusion,  restlessness -dark yellow or brown urine -difficulty breathing or swallowing -dizziness or fainting spells -drooling, shaking, movement difficulty (shuffling walk) or rigidity -fever, chills, sore throat -involuntary or uncontrollable movements of the eyes, mouth, head, arms, and legs -seizures -stomach area pain -unusually weak or tired -unusual bleeding or bruising -yellowing of skin or eyes Side effects that usually do not require medical attention (report to your doctor or health care professional if they continue or are bothersome): -difficulty passing urine -difficulty sleeping -headache -sexual dysfunction -skin rash, or itching This list may not describe all possible side effects. Call your doctor for medical advice about side effects. You may report side effects to FDA at 1-800-FDA-1088. Where should I keep my medicine? Keep out of the reach of children. Store at room temperature between 15 and 30 degrees C (59 and 86 degrees F). Protect from light. Throw away any unused medicine after the expiration date. NOTE: This sheet is a summary. It may not cover all possible information. If you have questions about this medicine, talk to your doctor, pharmacist, or health care provider.  2015, Elsevier/Gold Standard. (2011-06-19 16:59:39) Vitamin B12 Injections Every person needs vitamin B12. A deficiency develops when the body does not get enough of it. One way to overcome this is by getting B12 shots (injections). A B12 shot puts the vitamin directly into muscle tissue. This avoids any problems your body might have in absorbing it from food or a pill. In some people, the body has trouble using the vitamin correctly. This can cause  a B12 deficiency. Not consuming enough of the vitamin can also cause a deficiency. Getting enough vitamin B12 can be hard for elderly people. Sometimes, they do not eat a well-balanced diet. The elderly are also more likely than younger people to have medical  conditions or take medications that can lead to a deficiency. WHAT DOES VITAMIN B12 DO? Vitamin B12 does many things to help the body work right:  It helps the body make healthy red blood cells.  It helps maintain nerve cells.  It is involved in the body's process of converting food into energy (metabolism).  It is needed to make the genetic material in all cells (DNA). VITAMIN B12 FOOD SOURCES Most people get plenty of vitamin B12 through the foods they eat. It is present in:  Meat, fish, poultry, and eggs.  Milk and milk products.  It also is added when certain foods are made, including some breads, cereals and yogurts. The food is then called "fortified". CAUSES The most common causes of vitamin B12 deficiency are:  Pernicious anemia. The condition develops when the body cannot make enough healthy red blood cells. This stems from a lack of a protein made in the stomach (intrinsic factor). People without this protein cannot absorb enough vitamin B12 from food.  Malabsorption. This is when the body cannot absorb the vitamin. It can be caused by:  Pernicious anemia.  Surgery to remove part or all of the stomach can lead to malabsorption. Removal of part or all of the small intestine can also cause malabsorption.  Vegetarian diet. People who are strict about not eating foods from animals could have trouble taking in enough vitamin B12 from diet alone.  Medications. Some medicines have been linked to B12 deficiency, such as Metformin (a drug prescribed for type 2 diabetes). Long-term use of stomach acid suppressants also can keep the vitamin from being absorbed.  Intestinal problems such as inflammatory bowel disease. If there are problems in the digestive tract, vitamin B12 may not be absorbed in good enough amounts. SYMPTOMS People who do not get enough B12 can develop problems. These can include:  Anemia. This is when the body has too few red blood cells. Red blood cells carry  oxygen to the rest of the body. Without a healthy supply of red blood cells, people can feel:  Tired (fatigued).  Weak.  Severe anemia can cause:  Shortness of breath.  Dizziness.  Rapid heart rate.  Paleness.  Other Vitamin B12 deficiency symptoms include:  Diarrhea.  Numbness or tingling in the hands or feet.  Loss of appetite.  Confusion.  Sores on the tongue or in the mouth. LET YOUR CAREGIVER KNOW ABOUT:  Any allergies. It is very important to know if you are allergic or sensitive to cobalt. Vitamin B12 contains cobalt.  Any history of kidney disease.  All medications you are taking. Include prescription and over-the-counter medicines, herbs and creams.  Whether you are pregnant or breast-feeding.  If you have Leber's disease, a hereditary eye condition, vitamin B12 could make it worse. RISKS AND COMPLICATIONS Reactions to an injection are usually temporary. They might include:  Pain at the injection site.  Redness, swelling or tenderness at the site.  Headache, dizziness or weakness.  Nausea, upset stomach or diarrhea.  Numbness or tingling.  Fever.  Joint pain.  Itching or rash. If a reaction does not go away in a short while, talk with your healthcare provider. A change in the way the shots are given, or where  they are given, might need to be made. BEFORE AN INJECTION To decide whether B12 injections are right for you, your healthcare provider will probably:  Ask about your medical history.  Ask questions about your diet.  Ask about symptoms such as:  Have you felt weak?  Do you feel unusually tired?  Do you get dizzy?  Order blood tests. These may include a test to:  Check the level of red cells in your blood.  Measure B12 levels.  Check for the presence of intrinsic factor. VITAMIN B12 INJECTIONS How often you will need a vitamin B12 injection will depend on how severe your deficiency is. This also will affect how long you will  need to get them. People with pernicious anemia usually get injections for their entire life. Others might get them for a shorter period. For many people, injections are given daily or weekly for several weeks. Then, once B12 levels are normal, injections are given just once a month. If the cause of the deficiency can be fixed, the injections can be stopped. Talk with your healthcare provider about what you should expect. For an injection:  The injection site will be cleaned with an alcohol swab.  Your healthcare provider will insert a needle directly into a muscle. Most any muscle can be used. Most often, an arm muscle is used. A buttocks muscle can also be used. Many people say shots in that area are less painful.  A small adhesive bandage may be put over the injection site. It usually can be taken off in an hour or less. Injections can be given by your healthcare provider. In some cases, family members give them. Sometimes, people give them to themselves. Talk with your healthcare provider about what would be best for you. If someone other than your healthcare provider will be giving the shots, the person will need to be trained to give them correctly. HOME CARE INSTRUCTIONS   You can remove the adhesive bandage within an hour of getting a shot.  You should be able to go about your normal activities right away.  Avoid drinking large amounts of alcohol while taking vitamin B12 shots. Alcohol can interfere with the body's use of the vitamin. SEEK MEDICAL CARE IF:   Pain, redness, swelling or tenderness at the injection site does not get better or gets worse.  Headache, dizziness or weakness does not go away.  You develop a fever of more than 100.5 F (38.1 C). SEEK IMMEDIATE MEDICAL CARE IF:   You have chest pain.  You develop shortness of breath.  You have muscle weakness that gets worse.  You develop numbness, weakness or tingling on one side or one area of the body.  You have  symptoms of an allergic reaction, such as:  Hives.  Difficulty breathing.  Swelling of the lips, face, tongue or throat.  You develop a fever of more than 102.0 F (38.9 C). MAKE SURE YOU:   Understand these instructions.  Will watch your condition.  Will get help right away if you are not doing well or get worse. Document Released: 04/27/2008 Document Revised: 04/23/2011 Document Reviewed: 04/27/2008 [facility] Patient Information 2015 [place], [place]. This information is not intended to replace advice given to you by your health care provider. Make sure you discuss any questions you have with your health care provider. Folic Acid, Vitamin B9 tablets What is this medicine? FOLIC ACID (FOE lik AS id) is a water-soluble, B complex vitamin. It is in many foods like  liver, kidneys, yeast, and leafy, green vegetables. It is used to treat megaloblastic anemia and anemia from poor diet in pregnant women, babies, and children. This medicine may be used for other purposes; ask your health care provider or pharmacist if you have questions. COMMON BRAND NAME(S): Folacin, Folicet What should I tell my health care provider before I take this medicine? They need to know if you have any of these conditions: -alcoholism or alcohol cirrhosis -pernicious anemia -vitamin B12 deficient anemia -an unusual or allergic reaction to folic acid, other B vitamins, other medicines, foods, dyes, or preservatives -pregnant or trying to get pregnant -breast-feeding How should I use this medicine? Take this medicine by mouth with a glass of water. Follow the directions on your prescription label. Take your doses at regular intervals. Do not stop taking your medicine unless your doctor tells you to. Talk to your pediatrician regarding the use of this medicine in children. While this drug may be prescribed for selected conditions, precautions do apply. Overdosage: If you think you have taken too much of this  medicine contact a poison control center or emergency room at once. NOTE: This medicine is only for you. Do not share this medicine with others. What if I miss a dose? If you miss a dose, take it as soon as you can. If it is almost time for your next dose, take only that dose. Do not take double or extra doses. What may interact with this medicine? -chloramphenicol -cholestyramine -medicines for seizures -methotrexate -nitrofurantoin -pyrimethamine This list may not describe all possible interactions. Give your health care provider a list of all the medicines, herbs, non-prescription drugs, or dietary supplements you use. Also tell them if you smoke, drink alcohol, or use illegal drugs. Some items may interact with your medicine. What should I watch for while using this medicine? Visit your doctor or health care professional for regular check ups. Your doctor may order blood tests. You need to eat a proper diet even while you are taking this vitamin. Taking vitamin supplements is not a substitute for a healthy diet. Ask your doctor or health care provider for good nutrition advice. What side effects may I notice from receiving this medicine? Side effects that you should report to your doctor or health care professional as soon as possible: -allergic reactions such as skin rash or itching, hives, swelling of the lips, mouth, tongue, or throat -chest tightness or pain -wheezing or shortness of breath Side effects that usually do not require medical attention (report to your doctor or health care professional if they continue or are bothersome): -bitter or bad taste -confusion -irritable -loss of appetite -nausea -stomach gas This list may not describe all possible side effects. Call your doctor for medical advice about side effects. You may report side effects to FDA at 1-800-FDA-1088. Where should I keep my medicine? Keep out of the reach of children. Store at room temperature between 15  and 30 degrees C (59 and 86 degrees F). Protect from light. This medicine is quickly broken down and made inactive when exposed to heat or light. Throw away any unused medicine after the expiration date. NOTE: This sheet is a summary. It may not cover all possible information. If you have questions about this medicine, talk to your doctor, pharmacist, or health care provider.  2015, [employer] Standard. (2007-06-04 17:03:56) Lidocaine; Prilocaine cream What is this medicine? LIDOCAINE; PRILOCAINE (LYE doe kane; PRIL oh kane) is a topical anesthetic that causes loss of feeling in  the skin and surrounding tissues. It is used to numb the skin before procedures or injections. This medicine may be used for other purposes; ask your health care provider or pharmacist if you have questions. COMMON BRAND NAME(S): EMLA What should I tell my health care provider before I take this medicine? They need to know if you have any of these conditions: -glucose-6-phosphate deficiencies -heart disease -kidney or liver disease -methemoglobinemia -an unusual or allergic reaction to lidocaine, prilocaine, other medicines, foods, dyes, or preservatives -pregnant or trying to get pregnant -breast-feeding How should I use this medicine? This medicine is for external use only on the skin. Do not take by mouth. Follow the directions on the prescription label. Wash hands before and after use. Do not use more or leave in contact with the skin longer than directed. Do not apply to eyes or open wounds. It can cause irritation and blurred or temporary loss of vision. If this medicine comes in contact with your eyes, immediately rinse the eye with water. Do not touch or rub the eye. Contact your health care provider right away. Talk to your pediatrician regarding the use of this medicine in children. While this medicine may be prescribed for children for selected conditions, precautions do apply. Overdosage: If you think you  have taken too much of this medicine contact a poison control center or emergency room at once. NOTE: This medicine is only for you. Do not share this medicine with others. What if I miss a dose? This medicine is usually only applied once prior to each procedure. It must be in contact with the skin for a period of time for it to work. If you applied this medicine later than directed, tell your health care professional before starting the procedure. What may interact with this medicine? -acetaminophen -chloroquine -dapsone -medicines to control heart rhythm -nitrates like nitroglycerin and nitroprusside -other ointments, creams, or sprays that may contain anesthetic medicine -phenobarbital -phenytoin -quinine -sulfonamides like sulfacetamide, sulfamethoxazole, sulfasalazine and others This list may not describe all possible interactions. Give your health care provider a list of all the medicines, herbs, non-prescription drugs, or dietary supplements you use. Also tell them if you smoke, drink alcohol, or use illegal drugs. Some items may interact with your medicine. What should I watch for while using this medicine? Be careful to avoid injury to the treated area while it is numb and you are not aware of pain. Avoid scratching, rubbing, or exposing the treated area to hot or cold temperatures until complete sensation has returned. The numb feeling will wear off a few hours after applying the cream. What side effects may I notice from receiving this medicine? Side effects that you should report to your doctor or health care professional as soon as possible: -blurred vision -chest pain -difficulty breathing -dizziness -drowsiness -fast or irregular heartbeat -skin rash or itching -swelling of your throat, lips, or face -trembling Side effects that usually do not require medical attention (report to your doctor or health care professional if they continue or are bothersome): -changes in  ability to feel hot or cold -redness and swelling at the application site This list may not describe all possible side effects. Call your doctor for medical advice about side effects. You may report side effects to FDA at 1-800-FDA-1088. Where should I keep my medicine? Keep out of reach of children. Store at room temperature between 15 and 30 degrees C (59 and 86 degrees F). Keep container tightly closed. Throw away any unused  medicine after the expiration date. NOTE: This sheet is a summary. It may not cover all possible information. If you have questions about this medicine, talk to your doctor, pharmacist, or health care provider.  2015, [employer] Standard. (2007-08-04 17:14:35) Denosumab injection What is this medicine? DENOSUMAB (den oh sue mab) slows bone breakdown. Prolia is used to treat osteoporosis in women after menopause and in men. Delton See is used to prevent bone fractures and other bone problems caused by cancer bone metastases. Delton See is also used to treat giant cell tumor of the bone. This medicine may be used for other purposes; ask your health care provider or pharmacist if you have questions. COMMON BRAND NAME(S): Prolia, XGEVA What should I tell my health care provider before I take this medicine? They need to know if you have any of these conditions: -dental disease -eczema -infection or history of infections -kidney disease or on dialysis -low blood calcium or vitamin D -malabsorption syndrome -scheduled to have surgery or tooth extraction -taking medicine that contains denosumab -thyroid or parathyroid disease -an unusual reaction to denosumab, other medicines, foods, dyes, or preservatives -pregnant or trying to get pregnant -breast-feeding How should I use this medicine? This medicine is for injection under the skin. It is given by a health care professional in a hospital or clinic setting. If you are getting Prolia, a special MedGuide will be given to you by  the pharmacist with each prescription and refill. Be sure to read this information carefully each time. For Prolia, talk to your pediatrician regarding the use of this medicine in children. Special care may be needed. For Delton See, talk to your pediatrician regarding the use of this medicine in children. While this drug may be prescribed for children as young as 13 years for selected conditions, precautions do apply. Overdosage: If you think you've taken too much of this medicine contact a poison control center or emergency room at once. Overdosage: If you think you have taken too much of this medicine contact a poison control center or emergency room at once. NOTE: This medicine is only for you. Do not share this medicine with others. What if I miss a dose? It is important not to miss your dose. Call your doctor or health care professional if you are unable to keep an appointment. What may interact with this medicine? Do not take this medicine with any of the following medications: -other medicines containing denosumab This medicine may also interact with the following medications: -medicines that suppress the immune system -medicines that treat cancer -steroid medicines like prednisone or cortisone This list may not describe all possible interactions. Give your health care provider a list of all the medicines, herbs, non-prescription drugs, or dietary supplements you use. Also tell them if you smoke, drink alcohol, or use illegal drugs. Some items may interact with your medicine. What should I watch for while using this medicine? Visit your doctor or health care professional for regular checks on your progress. Your doctor or health care professional may order blood tests and other tests to see how you are doing. Call your doctor or health care professional if you get a cold or other infection while receiving this medicine. Do not treat yourself. This medicine may decrease your body's ability to  fight infection. You should make sure you get enough calcium and vitamin D while you are taking this medicine, unless your doctor tells you not to. Discuss the foods you eat and the vitamins you take with your health care  professional. See your dentist regularly. Brush and floss your teeth as directed. Before you have any dental work done, tell your dentist you are receiving this medicine. Do not become pregnant while taking this medicine or for 5 months after stopping it. Women should inform their doctor if they wish to become pregnant or think they might be pregnant. There is a potential for serious side effects to an unborn child. Talk to your health care professional or pharmacist for more information. What side effects may I notice from receiving this medicine? Side effects that you should report to your doctor or health care professional as soon as possible: -allergic reactions like skin rash, itching or hives, swelling of the face, lips, or tongue -breathing problems -chest pain -fast, irregular heartbeat -feeling faint or lightheaded, falls -fever, chills, or any other sign of infection -muscle spasms, tightening, or twitches -numbness or tingling -skin blisters or bumps, or is dry, peels, or red -slow healing or unexplained pain in the mouth or jaw -unusual bleeding or bruising Side effects that usually do not require medical attention (Report these to your doctor or health care professional if they continue or are bothersome.): -muscle pain -stomach upset, gas This list may not describe all possible side effects. Call your doctor for medical advice about side effects. You may report side effects to FDA at 1-800-FDA-1088. Where should I keep my medicine? This medicine is only given in a clinic, doctor's office, or other health care setting and will not be stored at home. NOTE: This sheet is a summary. It may not cover all possible information. If you have questions about this  medicine, talk to your doctor, pharmacist, or health care provider.  2015, [employer] Standard. (2011-07-30 12:37:47) Spironolactone tablets What is this medicine? SPIRONOLACTONE (speer on oh LAK tone) is a diuretic. It helps you make more urine and to lose excess water from your body. This medicine is used to treat high blood pressure, and edema or swelling from heart, kidney, or liver disease. It is also used to treat patients who make too much aldosterone or have low potassium. This medicine may be used for other purposes; ask your health care provider or pharmacist if you have questions. COMMON BRAND NAME(S): Aldactone What should I tell my health care provider before I take this medicine? They need to know if you have any of these conditions: -high blood level of potassium -kidney disease or trouble making urine -liver disease -an unusual or allergic reaction to spironolactone, other medicines, foods, dyes, or preservatives -pregnant or trying to get pregnant -breast-feeding How should I use this medicine? Take this medicine by mouth with a drink of water. Follow the directions on your prescription label. You can take it with or without food. If it upsets your stomach, take it with food. Do not take your medicine more often than directed. Remember that you will need to pass more urine after taking this medicine. Do not take your doses at a time of day that will cause you problems. Do not take at bedtime. Talk to your pediatrician regarding the use of this medicine in children. While this drug may be prescribed for selected conditions, precautions do apply. Overdosage: If you think you have taken too much of this medicine contact a poison control center or emergency room at once. NOTE: This medicine is only for you. Do not share this medicine with others. What if I miss a dose? If you miss a dose, take it as soon as you can.  If it is almost time for your next dose, take only that dose.  Do not take double or extra doses. What may interact with this medicine? Do not take this medicine with any of the following medications: -eplerenone This medicine may also interact with the following medications: -corticosteroids -digoxin -lithium -medicines for high blood pressure like ACE inhibitors -skeletal muscle relaxants like tubocurarine -NSAIDs, medicines for pain and inflammation, like ibuprofen or naproxen -potassium products like salt substitute or supplements -pressor amines like norepinephrine -some diuretics This list may not describe all possible interactions. Give your health care provider a list of all the medicines, herbs, non-prescription drugs, or dietary supplements you use. Also tell them if you smoke, drink alcohol, or use illegal drugs. Some items may interact with your medicine. What should I watch for while using this medicine? Visit your doctor or health care professional for regular checks on your progress. Check your blood pressure as directed. Ask your doctor what your blood pressure should be, and when you should contact them. You may need to be on a special diet while taking this medicine. Ask your doctor. Also, ask how many glasses of fluid you need to drink a day. You must not get dehydrated. This medicine may make you feel confused, dizzy or lightheaded. Drinking alcohol and taking some medicines can make this worse. Do not drive, use machinery, or do anything that needs mental alertness until you know how this medicine affects you. Do not sit or stand up quickly. What side effects may I notice from receiving this medicine? Side effects that you should report to your doctor or health care professional as soon as possible: -allergic reactions such as skin rash or itching, hives, swelling of the lips, mouth, tongue, or throat -black or tarry stools -fast, irregular heartbeat -fever -muscle pain, cramps -numbness, tingling in hands or feet -trouble  breathing -trouble passing urine -unusual bleeding -unusually weak or tired Side effects that usually do not require medical attention (report to your doctor or health care professional if they continue or are bothersome): -change in voice or hair growth -confusion -dizzy, drowsy -dry mouth, increased thirst -enlarged or tender breasts -headache -irregular menstrual periods -sexual difficulty, unable to have an erection -stomach upset This list may not describe all possible side effects. Call your doctor for medical advice about side effects. You may report side effects to FDA at 1-800-FDA-1088. Where should I keep my medicine? Keep out of the reach of children. Store below 25 degrees C (77 degrees F). Throw away any unused medicine after the expiration date. NOTE: This sheet is a summary. It may not cover all possible information. If you have questions about this medicine, talk to your doctor, pharmacist, or health care provider.  2015, [employer] Standard. (2009-10-11 12:51:30) Lorazepam tablets What is this medicine? LORAZEPAM (lor A ze pam) is a benzodiazepine. It is used to treat anxiety. This medicine may be used for other purposes; ask your health care provider or pharmacist if you have questions. COMMON BRAND NAME(S): Ativan What should I tell my health care provider before I take this medicine? They need to know if you have any of these conditions: -alcohol or drug abuse problem -bipolar disorder, depression, psychosis or other mental health condition -glaucoma -kidney or liver disease -lung disease or breathing difficulties -myasthenia gravis -Parkinson's disease -seizures or a history of seizures -suicidal thoughts -an unusual or allergic reaction to lorazepam, other benzodiazepines, foods, dyes, or preservatives -pregnant or trying to get pregnant -breast-feeding How should  I use this medicine? Take this medicine by mouth with a glass of water. Follow the  directions on the prescription label. If it upsets your stomach, take it with food or milk. Take your medicine at regular intervals. Do not take it more often than directed. Do not stop taking except on the advice of your doctor or health care professional. Talk to your pediatrician regarding the use of this medicine in children. Special care may be needed. Overdosage: If you think you have taken too much of this medicine contact a poison control center or emergency room at once. NOTE: This medicine is only for you. Do not share this medicine with others. What if I miss a dose? If you miss a dose, take it as soon as you can. If it is almost time for your next dose, take only that dose. Do not take double or extra doses. What may interact with this medicine? -barbiturate medicines for inducing sleep or treating seizures, like phenobarbital -clozapine -medicines for depression, mental problems or psychiatric disturbances -medicines for sleep -phenytoin -probenecid -theophylline -valproic acid This list may not describe all possible interactions. Give your health care provider a list of all the medicines, herbs, non-prescription drugs, or dietary supplements you use. Also tell them if you smoke, drink alcohol, or use illegal drugs. Some items may interact with your medicine. What should I watch for while using this medicine? Visit your doctor or health care professional for regular checks on your progress. Your body may become dependent on this medicine, ask your doctor or health care professional if you still need to take it. However, if you have been taking this medicine regularly for some time, do not suddenly stop taking it. You must gradually reduce the dose or you may get severe side effects. Ask your doctor or health care professional for advice before increasing or decreasing the dose. Even after you stop taking this medicine it can still affect your body for several days. You may get drowsy  or dizzy. Do not drive, use machinery, or do anything that needs mental alertness until you know how this medicine affects you. To reduce the risk of dizzy and fainting spells, do not stand or sit up quickly, especially if you are an older patient. Alcohol may increase dizziness and drowsiness. Avoid alcoholic drinks. Do not treat yourself for coughs, colds or allergies without asking your doctor or health care professional for advice. Some ingredients can increase possible side effects. What side effects may I notice from receiving this medicine? Side effects that you should report to your doctor or health care professional as soon as possible: -changes in vision -confusion -depression -mood changes, excitability or aggressive behavior -movement difficulty, staggering or jerky movements -muscle cramps -restlessness -weakness or tiredness Side effects that usually do not require medical attention (report to your doctor or health care professional if they continue or are bothersome): -constipation or diarrhea -difficulty sleeping, nightmares -dizziness, drowsiness -headache -nausea, vomiting This list may not describe all possible side effects. Call your doctor for medical advice about side effects. You may report side effects to FDA at 1-800-FDA-1088. Where should I keep my medicine? Keep out of the reach of children. This medicine can be abused. Keep your medicine in a safe place to protect it from theft. Do not share this medicine with anyone. Selling or giving away this medicine is dangerous and against the law. Store at room temperature between 20 and 25 degrees C (68 and 77 degrees F). Protect from  light. Keep container tightly closed. Throw away any unused medicine after the expiration date. NOTE: This sheet is a summary. It may not cover all possible information. If you have questions about this medicine, talk to your doctor, pharmacist, or health care provider.  2015, [employer]  Standard. (2007-08-01 14:58:20)

## 2013-08-25 NOTE — Progress Notes (Signed)
Subjective: Patient had port placed by Dr. Arnoldo Morale yesterday (7/13).  It appears clean without any irritation or discharge.    He reports that he spoke to Dr. Sarajane Jews and they wish to pursue a thoracentesis, but I am unaware of the timing of this.   From an oncology standpoint, he can be discharged.  Plan on obtaining a liver biopsy and pursuing systemic chemotherapy with carboplatin/pemetrexed/bevacizumab..   Objective: Vital signs in last 24 hours: Temp:  [97.4 F (36.3 C)-98.6 F (37 C)] 98.4 F (36.9 C) (07/14 0648) Pulse Rate:  [75-90] 80 (07/14 0648) Resp:  [17-30] 22 (07/14 0648) BP: (100-133)/(66-110) 107/70 mmHg (07/14 0648) SpO2:  [79 %-99 %] 95 % (07/14 0714) Weight:  [170 lb (77.111 kg)] 170 lb (77.111 kg) (07/13 1316)  Intake/Output from previous day: 07/13 0800 - 07/14 0759 In: 1250 [P.O.:400; I.V.:350; IV Piggyback:500] Out: 12 [Urine:2; Blood:10] Intake/Output this shift:    General appearance: alert, cooperative, appears older than stated age, mild distress and [place] in place for O2 delivery Port- no indication of infection  Lab Results:  No results found for this basename: WBC, HGB, HCT, PLT,  in the last 72 hours BMET  Recent Labs  08/24/13 0513 08/24/13 1202  NA 140 141  K 6.0* 5.2  CL 104 105  CO2 29 28  GLUCOSE 107* 106*  BUN 18 17  CREATININE 1.12 1.01  CALCIUM 8.6 8.5    Studies/Results: Dg Chest Port 1 View  08/24/2013   CLINICAL DATA:  Port-A-Cath placement.  EXAM: PORTABLE CHEST - 1 VIEW  COMPARISON:  08/18/2013  FINDINGS: Power injectable left subclavian Port-A-Cath terminates in the upper SVC near the brachiocephalic confluence. No pneumothorax observed.  Large right pleural effusion fills 2/3 of the right hemithorax. Interstitial accentuation in the right chest.  Platelike atelectasis at the left lung base. Right heart border obscured.  IMPRESSION: 1. Port-A-Cath tip: Upper SVC near the brachiocephalic confluence. No pneumothorax or  complicating feature. 2. Large right pleural effusion with indistinct interstitial opacity in the remaining aerated portion of the right upper lobe. 3. Platelike atelectasis, left lung base.   Electronically Signed   By: Sherryl Barters M.D.   On: 08/24/2013 14:50   Dg C-arm 1-60 Min-no Report  08/24/2013   CLINICAL DATA: right lung cancer   C-ARM 1-60 MINUTES  Fluoroscopy was utilized by the requesting physician.  No radiographic  interpretation.     Medications: I have reviewed the patient's current medications.  Assessment/Plan: 1. Stage IV NSCLC with a thoracentesis on 7/7 demonstrating an adenocarcinoma of lung primary (negative prostate specific testing). ALK and EGFR testing not able to be performed on thoracentesis sample.  2. Pain  3. PE, improving on recent CT angio of chest, on Xarelto. Xarelto on hold for thoracentesis. Recommend restarting Xarelto at 20 mg daily following future interventions. Presently on Lovenox.  4. H/O prostate cancer in 2009, managed by Dr. [place] Pink and treated with radiation by Dr. Sondra Come at [facility], no systemic therapy ever administered.  5. COPD  6. Odd affect, CT head is negative for any metastatic disease.  7. Depending on hospital course, he would benefit from a liver biopsy of hepatic lesions for ALK and EGFR testing.  He will need an outpatient hepatic lesion biopsy for ALK and EGFR testing.  Will set the patient up for an outpatient IR hepatic lesion biopsy for ALK and EGFR testing as I suspect the patient will be discharged today. 8. Will follow along as  an inpatient and following discharge, he will undergo chemotherapy teaching and then Carboplatin/Pemetrexed + Avastin every 21 days.  Patient and plan discussed with Dr. Farrel Gobble and he is in agreement with the aforementioned.      LOS: 9 days    KEFALAS,THOMAS 08/25/2013

## 2013-08-25 NOTE — Plan of Care (Signed)
Problem: Discharge Progression Outcomes Goal: O2 sats at patient's baseline Outcome: Progressing Patient discharged with oxygen at 4 liters nasal canula.

## 2013-08-25 NOTE — Progress Notes (Signed)
PROGRESS NOTE  Jonathan Goodwin:[id number] DOB: 14-May-1945 DOA: 08/16/2013 PCP: Jonathan Faster, FNP  Summary: 68 year old man diagnosed with submassive pulmonary embolism with right ventricular strain, left lower extremity DVT, possible healthcare associated pneumonia 5/29, treated Jonathan Goodwin who was rehospitalized 6/24-6/28 for suspected recurrent pneumonia. Imaging at that time suggested hepatic metastatic disease. On discharge plans were made for outpatient liver biopsy to further evaluate.  He presented to the emergency Department 7/5 with fever, recurrent cough and shortness of breath and was admitted for treatment of healthcare associated pneumonia and pleural effusion. He was seen by oncology and recommendations were to obtain thoracentesis, some fluid for cytology and obtain ultrasound-guided biopsy of the liver at a later date if nondiagnostic.  Assessment/Plan: 1. Acute hypoxic respiratory failure. Stable on 4 L. Multifactorial, newly diagnosed lung cancer, submassive pulmonary embolism, suspected COPD, healthcare associated pneumonia. 2. Stage IV NSCLC diagnosed by thoracentesis cytology. Port placement today, outpatient followup with oncology for chemotherapy. 3. Healthcare associated pneumonia MRSA, pseudomonas aeruginosa with sepsis on admission. Remains clinically stable.. 4. Right pleural effusion, exudative. Status post thoracentesis 7/7.  5. Tiny right apical pneumothorax post procedure thoracentesis. Asymptomatic. 6. Thrombocytopenia. Resolved. Secondary to infection. 7. Submassive pulmonary embolism with right ventricular strain diagnosed 5/29. Resume Xarelto on discharge. 8. Suspected COPD. remained stable. 9. Tobacco dependence. Recommend cessation.   Much improved. Plan discharge home today with home health oxygen, hospital bed. Complete oral antibiotics.  Okay to resume Xarelto per Dr. Thornton Papas and Dr. Arnoldo Morale.  He will f/u with the [facility] early next week,  f/u chest x-ray at that time.  Discussed with wife at bedside.  Jonathan Hodgkins, MD  [facility]  Pager [phone] If 7PM-7AM, please contact night-coverage at [email or web address], password [facility] 08/25/2013, 12:02 PM  LOS: 9 days   Consultants:  Oncology   Pulmonology  General surgery  Speech therapy: Regular, thin liquid  Physical therapy: home health.  Procedures:  7/11. Ultrasound guided thoracentesis with removal of 800 mL of bloody colored fluid.  Antibiotics:  Doxycycline 7/13  >> 7/19  Levaquin 7/13  >> 7/19  Vancomycin 7/6 >> 7/13  Cefepime 7/11 >> 7/13  Aztreonam 7/6>> 7/11  HPI/Subjective: Doing quite well status post thoracentesis. Breathing well. Wants to go home.  Objective: Filed Vitals:   08/25/13 0713 08/25/13 0714 08/25/13 1053 08/25/13 1133  BP:   101/65 115/80  Pulse:   100 92  Temp:    97.8 F (36.6 C)  TempSrc:      Resp:   20 20  Height:      Weight:      SpO2: 79% 95% 92% 97%    Intake/Output Summary (Last 24 hours) at 08/25/13 1202 Last data filed at 08/25/13 0649  Gross per 24 hour  Intake    750 ml  Output     12 ml  Net    738 ml     Filed Weights   08/16/13 1451 08/24/13 1316  Weight: 77.111 kg (170 lb) 77.111 kg (170 lb)    Exam:  Afebrile, hypoxia stable. Vitals stable. Gen. Appears well. Psych. Alert. Speech fluent and clear. Cardiovascular. Regular rate and rhythm. No murmur, rub or gallop. Respiratory. Improved air movement and breath sounds right side. Clear to auscultation bilaterally. Normal respiratory effort. No wheezes, rales or rhonchi.  Data Reviewed:   Scheduled Meds: . antiseptic oral rinse  15 mL Mouth Rinse BID  . budesonide-formoterol  2 puff Inhalation BID  . doxycycline  100 mg Oral  Q12H  . feeding supplement (ENSURE COMPLETE)  237 mL Oral BID BM  . folic acid  1 mg Oral Daily  . levofloxacin  750 mg Oral Daily  . multivitamin with minerals  1 tablet Oral Daily  . polyethylene glycol   17 g Oral BID  . thiamine  100 mg Oral Daily   Continuous Infusions:    Principal Problem:   HCAP (healthcare-associated pneumonia) Active Problems:   Pulmonary embolism   Lung nodule   COPD (chronic obstructive pulmonary disease)-PFT pending    Pneumonia   Stage IV NSCLC right lung

## 2013-08-25 NOTE — Procedures (Signed)
PreOperative Dx: Persistent RIGHT pleural effusion Postoperative Dx: Persistent RIGHT pleural effusion Procedure:   US guided RIGHT thoracentesis Radiologist:  Thornton Papas Anesthesia:  8 ml of 1% lidocaine Specimen:  1500 ml of old-appearing dark bloody colored fluid EBL:   < 1 ml Complications: None

## 2013-08-25 NOTE — Discharge Summary (Signed)
Physician Discharge Summary  Jonathan Goodwin LNL:[id number] DOB: [birth year] DOA: 08/16/2013  PCP: [name], FNP  Admit date: 08/16/2013 Discharge date: 08/25/2013  Recommendations for Outpatient Follow-up:  1. Hypoxic respiratory failure now on home oxygen at 4 L continuous, multifactorial, see below. 2. Newly diagnosed stage IV NSCLC with followup with [facility] planned less than 7 days for chemotherapy teaching 3. Malignant right-sided pleural effusion, consider repeat chest x-ray in followup to monitor for recurrence. 4. Resolution of MRSA and pseudomonas aeruginosa pneumonia 5. Continue treatment for history of pulmonary embolism 06/2013 6. Continue to encourage smoking cessation 7. Consider outpatient testing to confirm COPD   Follow-up Information   Follow up with [facility].   Contact information:   [address] [place] [address] [phone]       Follow up with [name], FNP. Schedule an appointment as soon as possible for a visit in 1 week.   Specialty:  Family Medicine   Contact information:   [address] [place] [address] [phone]       Follow up with KEFALAS,THOMAS, PA-C.   Specialty:  Physician Assistant   Contact information:   [place] [address] [phone]      Discharge Diagnoses:  1. Acute hypoxic respiratory failure 2. Stage IV non-small cell lung cancer, new diagnosis 3. Healthcare associated MRSA, pseudomonas aeruginosa pneumonia with sepsis on admission 4. Malignant right pleural effusion 5. Tiny right apical pneumothorax status post thoracentesis 6.  Submassive pulmonary embolism with right ventricular strain diagnosed May 2015. 7. Tobacco dependence.  Discharge Condition:  improved  Disposition:  Home with home health  Diet recommendation: heart healthy   Filed Weights   08/16/13 1451 08/24/13 1316  Weight: 77.111 kg (170 lb) 77.111 kg (170 lb)    History of  present illness:  68 year old man diagnosed with submassive pulmonary embolism with right ventricular strain, left lower extremity DVT, possible healthcare associated pneumonia 5/29, treated [name] who was rehospitalized 6/24-6/28 for suspected recurrent pneumonia. Imaging at that time suggested hepatic metastatic disease. On discharge plans were made for outpatient liver biopsy to further evaluate.   This hospitalization, presented to the emergency Department 7/5 with fever, recurrent cough and shortness of breath and was admitted for treatment of healthcare associated pneumonia and pleural effusion.   Hospital Course:  Patient was treated with broad-spectrum antibiotics with slow clinical improvement. Large pleural effusion was evacuated and cytology revealed stage IV non-small cell lung cancer. He was seen by oncology with plans made for outpatient chemotherapy teaching next week. Outpatient liver biopsy also planned. This will be coordinated by oncology. Pneumonia secondary to MRSA and Pseudomonas clinically much improved, case discussed with infectious disease Dr. Baxter Flattery who recommended transition to oral therapy doxycycline and Levaquin to complete 2 week course. Despite clinical improvement the patient remains with significant hypoxic respiratory failure secondary to resolving pneumonia, lung cancer, effusion and recent diagnosis of submassive pulmonary embolism. Plans are for discharge home today with home health.  1. Acute hypoxic respiratory failure. Stable on 4 L. Multifactorial, newly diagnosed lung cancer, submassive pulmonary embolism, suspected COPD, healthcare associated pneumonia. 2. Stage IV NSCLC diagnosed by thoracentesis cytology. S/p placement, outpatient followup with oncology for chemotherapy. 3. Healthcare associated pneumonia MRSA, pseudomonas aeruginosa with sepsis on admission.  4. Right pleural effusion, exudative. Status post thoracentesis 7/7, 7/14.  5. Tiny right  apical pneumothorax post procedure thoracentesis 7/7. Asymptomatic. 6. Thrombocytopenia. Resolved. Secondary to infection. 7. Submassive  pulmonary embolism with right ventricular strain diagnosed 5/29. Resume Xarelto on discharge (held for thoracentesis). 8. Suspected COPD. Stable. 9. Tobacco dependence. Recommend cessation. Much improved. Plan discharge home today with home health oxygen, hospital bed. Complete oral antibiotics.  Okay to resume Xarelto per Dr. Thornton Papas and Dr. Arnoldo Morale.  He will f/u with the [facility] early next week, f/u chest x-ray at that time.  Discussed with wife at bedside.  Consultants:  Oncology  Pulmonology  General surgery  Speech therapy: Regular, thin liquid  Physical therapy: home health. Procedures:  7/11. Ultrasound guided thoracentesis with removal of 800 mL of bloody colored fluid. Large-volume thoracentesis 7/14 with removal of 1.5 L. Antibiotics:  Doxycycline 7/13 >> 7/19  Levaquin 7/13 >> 7/19  Vancomycin 7/6 >> 7/13  Cefepime 7/11 >> 7/13 Aztreonam 7/6>> 7/11  Discharge Instructions  Discharge Instructions   Diet - low sodium heart healthy    Complete by:  As directed      Discharge instructions    Complete by:  As directed   Call your physician or seek immediate medical attention for increasing shortness of breath, wheezing or worsening of condition.     Increase activity slowly    Complete by:  As directed             Medication List    STOP taking these medications       amoxicillin-clavulanate 875-125 MG per tablet  Commonly known as:  AUGMENTIN      TAKE these medications       budesonide-formoterol 160-4.5 MCG/ACT inhaler  Commonly known as:  SYMBICORT  Inhale 2 puffs into the lungs 2 (two) times daily.     doxycycline 100 MG tablet  Commonly known as:  VIBRA-TABS  Take 1 tablet (100 mg total) by mouth every 12 (twelve) hours.     levofloxacin 750 MG tablet  Commonly known as:  LEVAQUIN  Take 1 tablet (750 mg  total) by mouth daily.     oxyCODONE 5 MG immediate release tablet  Commonly known as:  Oxy IR/ROXICODONE  Take 1 tablet (5 mg total) by mouth every 4 (four) hours as needed for moderate pain.     PROAIR HFA 108 (90 BASE) MCG/ACT inhaler  Generic drug:  albuterol  Inhale 2 puffs into the lungs every 6 (six) hours as needed for wheezing or shortness of breath.     rivaroxaban 20 MG Tabs tablet  Commonly known as:  XARELTO  Take 1 tablet (20 mg total) by mouth daily with supper.       No Known Allergies The results of significant diagnostics from this hospitalization (including imaging, microbiology, ancillary and laboratory) are listed below for reference.    Significant Diagnostic Studies: Dg Chest 1 View  08/25/2013   CLINICAL DATA:  Post thoracentesis.  EXAM: CHEST - 1 VIEW  COMPARISON:  08/24/2013.  FINDINGS: Decrease in size although incomplete clearance of right-sided pleural effusion post thoracentesis. No pneumothorax detected.  Right lung base infiltrate, atelectasis or mass may be present in addition to right-sided pleural effusion.  Left central line tip proximal superior vena cava level.  Calcified aorta.  Heart size not adequately assessed.  IMPRESSION: No pneumothorax detected post right-sided thoracentesis.  Please see above.   Electronically Signed   By: Chauncey Cruel M.D.   On: 08/25/2013 11:50   Ct Head W Wo Contrast  08/17/2013   CLINICAL DATA:  Staging for metastatic disease.  Unknown primary.  EXAM: CT HEAD WITHOUT AND  WITH CONTRAST  TECHNIQUE: Contiguous axial images were obtained from the base of the skull through the vertex without and with intravenous contrast  CONTRAST:  51mL OMNIPAQUE IOHEXOL 300 MG/ML  SOLN  COMPARISON:  None.  FINDINGS: No evidence for acute infarction, hemorrhage, mass lesion, hydrocephalus, or extra-axial fluid. Mild atrophy. Mild chronic microvascular ischemic change. Post infusion, no abnormal enhancement of the brain or meninges. Vascular  calcification. No acute sinus or mastoid disease. No osseous lesions.  IMPRESSION: Chronic changes as described. No acute intracranial abnormality. No enhancing lesions to suggest metastatic disease.   Electronically Signed   By: Rolla Flatten M.D.   On: 08/17/2013 17:21   Ct Angio Chest Pe W/cm &/or Wo Cm  08/16/2013   CLINICAL DATA:  Shortness of breath.  EXAM: CT ANGIOGRAPHY CHEST WITH CONTRAST  TECHNIQUE: Multidetector CT imaging of the chest was performed using the standard protocol during bolus administration of intravenous contrast. Multiplanar CT image reconstructions and MIPs were obtained to evaluate the vascular anatomy.  CONTRAST:  133mL OMNIPAQUE IOHEXOL 350 MG/ML SOLN  COMPARISON:  08/05/2013  FINDINGS: Small amount of residual eccentric chronic what again seen within the main right pulmonary artery and proximal right lower lobe pulmonary artery, not significantly changed. No new pulmonary embolus.  Increasing right pleural effusion, now moderate. Continued volume loss and air bronchograms within the right middle lobe, stable. Increasing airspace disease in the right lower lobe. Moderate emphysematous changes. Minimal scattered opacities noted in the left lower lobe at the left lung base and in the lingula.  Small nodule in the lingula measures 5 mm on image 60, stable. Adenopathy in the mediastinum and hila again noted, unchanged. AP window lymph node has a short axis diameter of 16 mm on image 37. Left hilar lymph node has a short axis diameter of 14 mm on image 41. Left hilar lymph node on image 53 as a short axis diameter of 19 mm. Other enlarged AP window, left hilar and paratracheal lymph nodes are also stable.  Heart is borderline in size. Coronary artery calcifications. Aorta is normal caliber.  Imaging into the upper abdomen again demonstrates multiple low-density lesions throughout the liver compatible with metastases, likely not significantly changed. Sclerotic foci throughout the thoracic  spine are stable, likely bone metastases.  Review of the MIP images confirms the above findings.  IMPRESSION: Stable chronic small pulmonary embolus in the right main and lower lobe pulmonary arteries. This is nonocclusive. No new pulmonary embolus.  Enlarging right pleural effusion with increasing right lower lobe consolidation. Stable volume loss and air bronchograms in the medial right middle lobe.  Stable mediastinal and left hilar adenopathy compatible with metastatic disease. Stable multiple ill-defined hepatic metastases. Stable sclerotic bone metastases.   Electronically Signed   By: Rolm Baptise M.D.   On: 08/16/2013 18:26   Dg Chest Port 1 View  08/24/2013   CLINICAL DATA:  Port-A-Cath placement.  EXAM: PORTABLE CHEST - 1 VIEW  COMPARISON:  08/18/2013  FINDINGS: Power injectable left subclavian Port-A-Cath terminates in the upper SVC near the brachiocephalic confluence. No pneumothorax observed.  Large right pleural effusion fills 2/3 of the right hemithorax. Interstitial accentuation in the right chest.  Platelike atelectasis at the left lung base. Right heart border obscured.  IMPRESSION: 1. Port-A-Cath tip: Upper SVC near the brachiocephalic confluence. No pneumothorax or complicating feature. 2. Large right pleural effusion with indistinct interstitial opacity in the remaining aerated portion of the right upper lobe. 3. Platelike atelectasis, left lung base.  Electronically Signed   By: Sherryl Barters M.D.   On: 08/24/2013 14:50   Dg Chest Port 1 View  ed   By: Margaree Mackintosh M.D.   On: 08/16/2013 15:35   Microbiology: Recent Results (from the past 240 hour(s))  CULTURE, BLOOD (ROUTINE X 2)     Status: None   Collection Time    08/16/13  9:19 PM      Result Value Ref Range Status   Specimen Description BLOOD RIGHT ANTECUBITAL   Final   Special Requests BOTTLES DRAWN AEROBIC AND ANAEROBIC 12CC   Final   Culture NO GROWTH 5 DAYS   Final   Report Status 08/21/2013 FINAL   Final    CULTURE, BLOOD (ROUTINE X 2)     Status: None   Collection Time    08/16/13  9:19 PM      Result Value Ref Range Status   Specimen Description BLOOD LEFT ANTECUBITAL   Final   Special Requests BOTTLES DRAWN AEROBIC AND ANAEROBIC 8CC   Final   Culture NO GROWTH 5 DAYS   Final   Report Status 08/21/2013 FINAL   Final  CULTURE, EXPECTORATED SPUTUM-ASSESSMENT     Status: None   Collection Time    08/17/13  8:48 PM      Result Value Ref Range Status   Specimen Description SPUTUM   Final   Special Requests NONE   Final   Sputum evaluation     Final   Value: THIS SPECIMEN IS ACCEPTABLE. RESPIRATORY CULTURE REPORT TO FOLLOW.     PERFORMED AT [facility]   Report Status 08/17/2013 FINAL   Final  CULTURE, RESPIRATORY (NON-EXPECTORATED)     Status: None   Collection Time    08/17/13  8:48 PM      Result Value Ref Range Status   Specimen Description SPUTUM   Final   Special Requests NONE   Final   Gram Stain     Final   Value: RARE WBC PRESENT, PREDOMINANTLY MONONUCLEAR     FEW SQUAMOUS EPITHELIAL CELLS PRESENT     MODERATE GRAM POSITIVE COCCI IN PAIRS     IN CLUSTERS FEW GRAM POSITIVE RODS     FEW GRAM NEGATIVE RODS   Culture     Final   Value: NORMAL OROPHARYNGEAL FLORA     Performed at [facility]   Report Status 08/20/2013 FINAL   Final  BODY FLUID CULTURE     Status: None   Collection Time    08/18/13  3:30 PM      Result Value Ref Range Status   Specimen Description PLEURAL FLUID   Final   Special Requests IMMUNE:COMPROMISED   Final   Gram Stain     Final   Value: RARE WBC PRESENT, PREDOMINANTLY PMN     NO ORGANISMS SEEN     Performed at [facility]   Culture     Final   Value: FEW PSEUDOMONAS AERUGINOSA     Note: CALLED TO VALDESE DILDY 08/20/13 1140 BY SMITHERSJ     RARE METHICILLIN RESISTANT STAPHYLOCOCCUS AUREUS     Note: RIFAMPIN AND GENTAMICIN SHOULD NOT BE USED AS SINGLE DRUGS FOR TREATMENT OF STAPH INFECTIONS. CRITICAL RESULT CALLED TO, READ BACK BY AND  VERIFIED WITH: VAL DESE@1420  ON [id number] BY [place] This organism is presumed to be Clindamycin resistant based on      detection of inducible Clindamycin resistance.     Performed at [facility]   Report  Status 08/22/2013 FINAL   Final   Organism ID, Bacteria PSEUDOMONAS AERUGINOSA   Final   Organism ID, Bacteria METHICILLIN RESISTANT STAPHYLOCOCCUS AUREUS   Final  MRSA PCR SCREENING     Status: None   Collection Time    08/21/13  7:35 PM      Result Value Ref Range Status   MRSA by PCR NEGATIVE  NEGATIVE Final   Comment:            The GeneXpert MRSA Assay (FDA     approved for NASAL specimens     only), is one component of a     comprehensive MRSA colonization     surveillance program. It is not     intended to diagnose MRSA     infection nor to guide or     monitor treatment for     MRSA infections.     Labs: Basic Metabolic Panel:  Recent Labs Lab 08/19/13 0544 08/21/13 0504 08/24/13 0513 08/24/13 1202  NA 141 137 140 141  K 5.2 4.9 6.0* 5.2  CL 106 103 104 105  CO2 23 22 29 28   GLUCOSE 113* 114* 107* 106*  BUN 25* 27* 18 17  CREATININE 1.08 1.09 1.12 1.01  CALCIUM 8.4 8.3* 8.6 8.5   CBC:  Recent Labs Lab 08/19/13 0544 08/20/13 0541 08/22/13 0558  WBC 15.5* 15.8* 13.5*  HGB 14.4 14.5 14.3  HCT 44.6 45.3 43.8  MCV 90.7 89.7 89.9  PLT 101* 137* 203     Recent Labs  07/10/13 1014 08/05/13 1856 08/16/13 1509  PROBNP 1349.0* 204.3* 230.8*    Principal Problem:   HCAP (healthcare-associated pneumonia) Active Problems:   Pulmonary embolism   Lung nodule   COPD (chronic obstructive pulmonary disease)-PFT pending    Pneumonia   Stage IV NSCLC right lung   Time coordinating discharge:  25 minutes.   Signed:  [name], MD [facility] 08/25/2013, 12:11 PM

## 2013-08-25 NOTE — Progress Notes (Signed)
Thoracentesis complete no signs of distress. 1500 ml red colored pleural fluid removed.

## 2013-08-25 NOTE — Progress Notes (Signed)
Patient discharged home with instructions given on medications,and follow up visits,patient,and family verbalized understanding.Prescription sent with patient,and the other medications electronically sent to Pharmacy of choice documented on AVS. Vital signs stable.Discharged with oxygen at 4 liters nasal canula.Accompanied by staff to an awaiting vehicle.

## 2013-08-26 NOTE — Progress Notes (Signed)
UR chart review completed.  

## 2013-08-29 ENCOUNTER — Encounter (HOSPITAL_COMMUNITY): Payer: Self-pay | Admitting: Oncology

## 2013-08-29 DIAGNOSIS — C7951 Secondary malignant neoplasm of bone: Secondary | ICD-10-CM

## 2013-08-29 HISTORY — DX: Secondary malignant neoplasm of bone: C79.51

## 2013-08-29 NOTE — Progress Notes (Signed)
[name], FNP [address] [place] [address]  Stage IV NSCLC right lung - Plan: CBC with Differential, Basic metabolic panel, DG Chest 2 View  Bone metastases  Bilateral edema of lower extremity - Plan: spironolactone (ALDACTONE) 50 MG tablet  CURRENT THERAPY: To embark on Carboplatin/Pemetrexed + Avastin.  Xgeva every 4 weeks for Metastatic bone lesions.  INTERVAL HISTORY: Jonathan Goodwin 68 y.o. 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     Stage IV NSCLC right lung   08/08/2013 Imaging CT Abd/Pelvis- Hepatic, nodal, and osseous metastatic disease.   08/16/2013 Imaging CT angio of chest- Enlarging right pleural effusion, mediastinal and left hilar adenopathy compatible with metastatic disease. Stable multiple ill-defined hepatic metastases. Stable sclerotic bone metastases.   08/17/2013 Initial Diagnosis Diagnosis PLEURAL FLUID, RIGHT (SPECIMEN 1 OF 1 COLLECTED 08/19/13): MALIGNANT CELLS CONSISTENT WITH METASTATIC ADENOCARCINOMA OF LUNG PRIMARY. ACUTE INFLAMMATION.    Chemotherapy Carboplatin/Alimta + Avastin.  Xgeva every 4 weeks.   I personally reviewed and went over laboratory results with the patient.  The results are noted within this dictation.  He is noted to have a thrombocytopenia at 72,000 and therefore, I have decreased the AUC of Carboplatin to 4 (compared to 5).  He completed chemotherapy teaching on 7/20 in preparation for chemotherapy today.  He is scheduled for an US guided hepatic liver lesion biopsy for EGFR and ALK testing which may guide future therapy options.   He reports that this AM he felt "like a miracle happened."  He reports that he felt great this AM.  His appetite has improved.  His well-being and QOL improved, etc.  I suspect this is secondary to his steroid pre-med that he started yesterday.  He reports that his spirits are high today.  He is noted to have  severe LE edema.  It is pitting.  I suspect it is multifactorial from malnutrition with hypoalbuminemia and sedentary lifestyle of late.  I started him on Aldactone and recommended elevation of LE.  If his legs are not improved over the next week, adding Lasix would be appropriate.  He reports that he is having some #1 toe nail issues that have been addressed in the past by his podiatrist.  I recommended that he follow-up with his podiatrist.  Oncologically, he denies any other complaints and ROS questioning is negative.   Past Medical History  Diagnosis Date  . H/O renal calculi   . Urethral stricture   . Wears hearing aid     right ear  . Hearing impaired person, right   . Arthritis   . Macular degeneration   . Ulcerative colitis   . History of kidney stones   . Pulmonary embolus 07/10/2013  . DVT, lower extremity 07/10/2013    LLE  . COPD (chronic obstructive pulmonary disease)   . Prostate cancer 10/24/07    radiation therapy  . Non-small cell cancer of right lung 08/21/2013    diagnosed by cytology from thorocentesis  . Bone metastases 08/29/2013    has PE (pulmonary embolism); Acute renal failure; HCAP (healthcare-associated pneumonia); COPD (chronic obstructive pulmonary disease)-PFT pending ; H/O Prostate cancer; Leukocytosis; Coagulopathy; Hypoxemia; Acute respiratory failure with hypoxia; Pneumonia; Stage IV NSCLC right lung; and Bone metastases on his problem list.     has No Known Allergies.  Jonathan Goodwin does not currently have medications on file.  Past Surgical History  Procedure Laterality Date  . Knee arthroscopy  1993    right  . Appendectomy  2010    [facility], Dr. Romona Curls  . Shoulder surgery      left, for chronic dislocation  . Cystoscopy  2010    [facility], Dr. Maryland Pink  . Urethrotomy  06/12/2011    Procedure: CYSTOSCOPY/URETHROTOMY;  Surgeon: Marissa Nestle, MD;  Location: [facility];  Service: Urology;  Laterality: N/A;  optical urethrotomy  . Cataract extraction  w/phaco Left 01/15/2013    Procedure: CATARACT EXTRACTION PHACO AND INTRAOCULAR LENS PLACEMENT (IOC);  Surgeon: Tonny Branch, MD;  Location: [facility];  Service: Ophthalmology;  Laterality: Left;  CDE:22.42  . Cystoscopy with urethral dilatation N/A 06/30/2013    Procedure: CYSTOSCOPY WITH URETHRAL DILATATION;  Surgeon: Marissa Nestle, MD;  Location: [facility];  Service: Urology;  Laterality: N/A;  . Rectal exam under anesthesia N/A 06/30/2013    Procedure: RECTAL EXAM UNDER ANESTHESIA;  Surgeon: Marissa Nestle, MD;  Location: [facility];  Service: Urology;  Laterality: N/A;  . Urethrotomy N/A 06/30/2013    Procedure: CYSTOSCOPY/URETHROTOMY;  Surgeon: Marissa Nestle, MD;  Location: [facility];  Service: Urology;  Laterality: N/A;  . Cystoscopy 2015    . Portacath placement Left 08/24/2013    Procedure: INSERTION PORT-A-CATH LEFT SUBCLAVIAN;  Surgeon: Jamesetta So, MD;  Location: [facility];  Service: General;  Laterality: Left;    Denies any headaches, dizziness, double vision, fevers, chills, night sweats, nausea, vomiting, diarrhea, constipation, chest pain, heart palpitations, shortness of breath, blood in stool, black tarry stool, urinary pain, urinary burning, urinary frequency, hematuria.   PHYSICAL EXAMINATION  ECOG PERFORMANCE STATUS: 2 - Symptomatic, <50% confined to bed  There were no vitals filed for this visit.  GENERAL:alert, anxious, comfortable, cooperative, smiling and [place] in place delivering O2 SKIN: skin color, texture, turgor are normal, no rashes or significant lesions, thickened from smoking HEAD: Normocephalic, No masses, lesions, tenderness or abnormalities EYES: normal, PERRLA, EOMI, Conjunctiva are pink and non-injected EARS: External ears normal OROPHARYNX:mucous membranes are moist  NECK: supple, trachea midline LYMPH:  not examined BREAST:not examined LUNGS: not examined HEART: not examined ABDOMEN: not examined BACK: Back symmetric, no curvature. EXTREMITIES:less then  2 second capillary refill, no joint deformities, effusion, or inflammation, no skin discoloration, no cyanosis, positive findings:  edema 3+ pitting edema bilaterally in lower legs, ankle, and feet.  NEURO: alert & oriented x 3 with fluent speech, no focal motor/sensory deficits   LABORATORY DATA: CBC    Component Value Date/Time   WBC 16.9* 09/01/2013 1009   RBC 3.94* 09/01/2013 1009   HGB 11.4* 09/01/2013 1009   HCT 35.1* 09/01/2013 1009   PLT 71* 09/01/2013 1009   MCV 89.1 09/01/2013 1009   MCH 28.9 09/01/2013 1009   MCHC 32.5 09/01/2013 1009   RDW 16.5* 09/01/2013 1009   LYMPHSABS 0.5* 09/01/2013 1009   MONOABS 0.7 09/01/2013 1009   EOSABS 0.0 09/01/2013 1009   BASOSABS 0.0 09/01/2013 1009      Chemistry      Component Value Date/Time   NA 138 09/01/2013 1009   K 5.3 09/01/2013 1009   CL 102 09/01/2013 1009   CO2 26 09/01/2013 1009   BUN 31* 09/01/2013 1009   CREATININE 1.18 09/01/2013 1009      Component Value Date/Time   CALCIUM 8.5 09/01/2013 1009   ALKPHOS 321* 09/01/2013 1009   AST 36 09/01/2013 1009   ALT 32 09/01/2013 1009   BILITOT 0.7 09/01/2013  1009       ASSESSMENT:  1.  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  2. PE, on Xarelto.  Likely secondary to #2. 3. H/O prostate cancer in 2009, managed by Dr. Maryland Pink and treated with radiation by Dr. Sondra Come at [facility], no systemic therapy ever administered.  4. COPD 5. Odd affect, CT head is negative for any metastatic disease 6. LE edema 7. Thrombocytopenia, prior to chemotherapy administration  Patient Active Problem List   Diagnosis Date Noted  . Bone metastases 08/29/2013  . Stage IV NSCLC right lung 08/22/2013  . Pneumonia 08/16/2013  . Acute respiratory failure with hypoxia 08/06/2013  . Leukocytosis 08/05/2013  . Coagulopathy 08/05/2013  . Hypoxemia 08/05/2013  . Acute renal failure 07/24/2013  . HCAP (healthcare-associated pneumonia) 07/24/2013  . COPD  (chronic obstructive pulmonary disease)-PFT pending  07/24/2013  . PE (pulmonary embolism) 07/10/2013  . H/O Prostate cancer 10/24/2007    PLAN:  1. I personally reviewed and went over laboratory results with the patient.  The results are noted within this dictation. 2. I personally reviewed and went over radiographic studies with the patient.  The results are noted within this dictation.   3. Patient educated on the role of systemic chemotherapy which is palliative. 4. Patient education regarding [facility], including risks, benefits, alternatives, and side effects including ONJ and hypocalcemia. 5. Continue Oscal + D BID 6. Chemotherapy (Carboplatin/Alimta/Avastin) today 7. Xgeva today 8. Rescheduled follow-up appointment to avoid interference with US biopsy of liver lesion on 7/28 9. US-guided biopsy of metastatic liver lesion on 7/28 to test for ALK and EGFR to guide therapy options. 10. Rx for Ativan 0.5 mg tabs TID 11. Aldactone 50 mg BID 12. Labs in 1 week: CBC diff, BMET 13. Chest Xray in 1 week to evaluate for pleural effusion 14. Reduce Carboplatin to an AUC of 4 due to thrombocytopenia 15. Return in 1 week for follow-up after being administered cycle 1 of chemotherapy with labs and chest xray.   THERAPY PLAN:  We will treat with 2-3 cycles of Carboplatin/Pemetrexed/Avastin and then restage with CT CAP.  He will undergo an US-guided biopsy of hepatic lesion to test for ALK and EGFR to guide therapy.  I have reduced his Carboplatin dose to an AUC of 4 due to thrombocytopenia before initiation of cycle 1 of chemotherapy.    All questions were answered. The patient knows to call the clinic with any problems, questions or concerns. We can certainly see the patient much sooner if necessary.  Patient and plan discussed with Dr. Nelida Meuse and he is in agreement with the aforementioned.   More than 50% of the time spent with the patient was utilized for counseling and coordination of  care.  Jonathan Goodwin 09/01/2013

## 2013-08-31 ENCOUNTER — Encounter (HOSPITAL_COMMUNITY): Payer: Medicare Other | Attending: Hematology

## 2013-08-31 VITALS — BP 104/71 | HR 93 | Resp 24

## 2013-08-31 DIAGNOSIS — C787 Secondary malignant neoplasm of liver and intrahepatic bile duct: Secondary | ICD-10-CM | POA: Insufficient documentation

## 2013-08-31 DIAGNOSIS — C3490 Malignant neoplasm of unspecified part of unspecified bronchus or lung: Secondary | ICD-10-CM | POA: Insufficient documentation

## 2013-08-31 DIAGNOSIS — J4489 Other specified chronic obstructive pulmonary disease: Secondary | ICD-10-CM | POA: Insufficient documentation

## 2013-08-31 DIAGNOSIS — C7951 Secondary malignant neoplasm of bone: Secondary | ICD-10-CM | POA: Insufficient documentation

## 2013-08-31 DIAGNOSIS — Z8546 Personal history of malignant neoplasm of prostate: Secondary | ICD-10-CM | POA: Insufficient documentation

## 2013-08-31 DIAGNOSIS — Z923 Personal history of irradiation: Secondary | ICD-10-CM | POA: Insufficient documentation

## 2013-08-31 DIAGNOSIS — Z7901 Long term (current) use of anticoagulants: Secondary | ICD-10-CM | POA: Insufficient documentation

## 2013-08-31 DIAGNOSIS — D696 Thrombocytopenia, unspecified: Secondary | ICD-10-CM | POA: Insufficient documentation

## 2013-08-31 DIAGNOSIS — R609 Edema, unspecified: Secondary | ICD-10-CM | POA: Insufficient documentation

## 2013-08-31 DIAGNOSIS — C7952 Secondary malignant neoplasm of bone marrow: Secondary | ICD-10-CM

## 2013-08-31 DIAGNOSIS — C779 Secondary and unspecified malignant neoplasm of lymph node, unspecified: Secondary | ICD-10-CM | POA: Insufficient documentation

## 2013-08-31 DIAGNOSIS — I2699 Other pulmonary embolism without acute cor pulmonale: Secondary | ICD-10-CM | POA: Insufficient documentation

## 2013-08-31 DIAGNOSIS — J449 Chronic obstructive pulmonary disease, unspecified: Secondary | ICD-10-CM | POA: Insufficient documentation

## 2013-08-31 DIAGNOSIS — C3491 Malignant neoplasm of unspecified part of right bronchus or lung: Secondary | ICD-10-CM

## 2013-08-31 MED ORDER — LORAZEPAM 0.5 MG PO TABS: 0.5000 mg | ORAL_TABLET | Freq: Three times a day (TID) | ORAL | Status: DC

## 2013-08-31 MED ORDER — CYANOCOBALAMIN 1000 MCG/ML IJ SOLN
1000.0000 ug | Freq: Once | INTRAMUSCULAR | Status: AC
  Administered 2013-08-31: 1000 ug via INTRAMUSCULAR

## 2013-08-31 MED ORDER — CYANOCOBALAMIN 1000 MCG/ML IJ SOLN
INTRAMUSCULAR | Status: AC
  Filled 2013-08-31: qty 1

## 2013-08-31 MED ORDER — SPIRONOLACTONE 50 MG PO TABS: 50.0000 mg | ORAL_TABLET | Freq: Two times a day (BID) | ORAL | Status: DC

## 2013-08-31 NOTE — Progress Notes (Signed)
Jonathan Goodwin presents today for injection per MD orders. B12 1060mcg administered SQ in right Upper Arm. Administration without incident. Patient tolerated well.  Chemo teaching done and consent signed for Carboplatin, Pemetrexed, Avastin, & XGEVA. Med calendar given. Meds already picked up from pharmacy last week. 3 meds to be picked up today or tomorrow including: Aldactone, Ativan, & Oscal+D.   Distress screening done.

## 2013-09-01 ENCOUNTER — Encounter (HOSPITAL_COMMUNITY): Payer: Medicare Other

## 2013-09-01 ENCOUNTER — Encounter (HOSPITAL_COMMUNITY): Payer: Self-pay

## 2013-09-01 ENCOUNTER — Encounter (HOSPITAL_BASED_OUTPATIENT_CLINIC_OR_DEPARTMENT_OTHER): Payer: Medicare Other

## 2013-09-01 ENCOUNTER — Encounter (HOSPITAL_BASED_OUTPATIENT_CLINIC_OR_DEPARTMENT_OTHER): Payer: Medicare Other | Admitting: Oncology

## 2013-09-01 VITALS — BP 110/76 | HR 75 | Temp 97.8°F | Resp 22 | Wt 197.0 lb

## 2013-09-01 DIAGNOSIS — R609 Edema, unspecified: Secondary | ICD-10-CM | POA: Diagnosis not present

## 2013-09-01 DIAGNOSIS — C7952 Secondary malignant neoplasm of bone marrow: Secondary | ICD-10-CM

## 2013-09-01 DIAGNOSIS — C7951 Secondary malignant neoplasm of bone: Secondary | ICD-10-CM

## 2013-09-01 DIAGNOSIS — C3491 Malignant neoplasm of unspecified part of right bronchus or lung: Secondary | ICD-10-CM

## 2013-09-01 DIAGNOSIS — Z8546 Personal history of malignant neoplasm of prostate: Secondary | ICD-10-CM | POA: Diagnosis not present

## 2013-09-01 DIAGNOSIS — C779 Secondary and unspecified malignant neoplasm of lymph node, unspecified: Secondary | ICD-10-CM | POA: Diagnosis not present

## 2013-09-01 DIAGNOSIS — C3490 Malignant neoplasm of unspecified part of unspecified bronchus or lung: Secondary | ICD-10-CM | POA: Diagnosis not present

## 2013-09-01 DIAGNOSIS — R600 Localized edema: Secondary | ICD-10-CM

## 2013-09-01 DIAGNOSIS — Z923 Personal history of irradiation: Secondary | ICD-10-CM | POA: Diagnosis not present

## 2013-09-01 DIAGNOSIS — D696 Thrombocytopenia, unspecified: Secondary | ICD-10-CM | POA: Diagnosis not present

## 2013-09-01 DIAGNOSIS — Z5111 Encounter for antineoplastic chemotherapy: Secondary | ICD-10-CM

## 2013-09-01 DIAGNOSIS — Z7901 Long term (current) use of anticoagulants: Secondary | ICD-10-CM | POA: Diagnosis not present

## 2013-09-01 DIAGNOSIS — C787 Secondary malignant neoplasm of liver and intrahepatic bile duct: Secondary | ICD-10-CM | POA: Diagnosis not present

## 2013-09-01 DIAGNOSIS — Z5112 Encounter for antineoplastic immunotherapy: Secondary | ICD-10-CM

## 2013-09-01 DIAGNOSIS — Z452 Encounter for adjustment and management of vascular access device: Secondary | ICD-10-CM

## 2013-09-01 DIAGNOSIS — I2699 Other pulmonary embolism without acute cor pulmonale: Secondary | ICD-10-CM | POA: Diagnosis not present

## 2013-09-01 DIAGNOSIS — J449 Chronic obstructive pulmonary disease, unspecified: Secondary | ICD-10-CM | POA: Diagnosis not present

## 2013-09-01 LAB — CBC WITH DIFFERENTIAL/PLATELET
BASOS ABS: 0 10*3/uL (ref 0.0–0.1)
BASOS PCT: 0 % (ref 0–1)
Eosinophils Absolute: 0 10*3/uL (ref 0.0–0.7)
Eosinophils Relative: 0 % (ref 0–5)
HCT: 35.1 % — ABNORMAL LOW (ref 39.0–52.0)
Hemoglobin: 11.4 g/dL — ABNORMAL LOW (ref 13.0–17.0)
Lymphocytes Relative: 3 % — ABNORMAL LOW (ref 12–46)
Lymphs Abs: 0.5 10*3/uL — ABNORMAL LOW (ref 0.7–4.0)
MCH: 28.9 pg (ref 26.0–34.0)
MCHC: 32.5 g/dL (ref 30.0–36.0)
MCV: 89.1 fL (ref 78.0–100.0)
Monocytes Absolute: 0.7 10*3/uL (ref 0.1–1.0)
Monocytes Relative: 4 % (ref 3–12)
NEUTROS ABS: 15.7 10*3/uL — AB (ref 1.7–7.7)
Neutrophils Relative %: 93 % — ABNORMAL HIGH (ref 43–77)
PLATELETS: 71 10*3/uL — AB (ref 150–400)
RBC: 3.94 MIL/uL — ABNORMAL LOW (ref 4.22–5.81)
RDW: 16.5 % — AB (ref 11.5–15.5)
WBC: 16.9 10*3/uL — ABNORMAL HIGH (ref 4.0–10.5)

## 2013-09-01 LAB — COMPREHENSIVE METABOLIC PANEL
ALBUMIN: 2 g/dL — AB (ref 3.5–5.2)
ALT: 32 U/L (ref 0–53)
AST: 36 U/L (ref 0–37)
Alkaline Phosphatase: 321 U/L — ABNORMAL HIGH (ref 39–117)
Anion gap: 10 (ref 5–15)
BILIRUBIN TOTAL: 0.7 mg/dL (ref 0.3–1.2)
BUN: 31 mg/dL — ABNORMAL HIGH (ref 6–23)
CO2: 26 mEq/L (ref 19–32)
Calcium: 8.5 mg/dL (ref 8.4–10.5)
Chloride: 102 mEq/L (ref 96–112)
Creatinine, Ser: 1.18 mg/dL (ref 0.50–1.35)
GFR calc Af Amer: 71 mL/min — ABNORMAL LOW (ref >90)
GFR calc non Af Amer: 61 mL/min — ABNORMAL LOW (ref >90)
Glucose, Bld: 139 mg/dL — ABNORMAL HIGH (ref 70–99)
Potassium: 5.3 mEq/L (ref 3.7–5.3)
Sodium: 138 mEq/L (ref 137–147)
Total Protein: 6.1 g/dL (ref 6.0–8.3)

## 2013-09-01 LAB — URINALYSIS, DIPSTICK ONLY
Bilirubin Urine: NEGATIVE
GLUCOSE, UA: NEGATIVE mg/dL
Hgb urine dipstick: NEGATIVE
KETONES UR: NEGATIVE mg/dL
LEUKOCYTES UA: NEGATIVE
NITRITE: NEGATIVE
PROTEIN: NEGATIVE mg/dL
Specific Gravity, Urine: 1.01 (ref 1.005–1.030)
UROBILINOGEN UA: 0.2 mg/dL (ref 0.0–1.0)
pH: 5 (ref 5.0–8.0)

## 2013-09-01 MED ORDER — SODIUM CHLORIDE 0.9 % IV SOLN
360.0000 mg | Freq: Once | INTRAVENOUS | Status: AC
  Administered 2013-09-01: 360 mg via INTRAVENOUS
  Filled 2013-09-01: qty 36

## 2013-09-01 MED ORDER — ALTEPLASE 2 MG IJ SOLR
INTRAMUSCULAR | Status: AC
  Filled 2013-09-01: qty 2

## 2013-09-01 MED ORDER — DEXAMETHASONE SODIUM PHOSPHATE 10 MG/ML IJ SOLN: 20.0000 mg | Freq: Once | INTRAMUSCULAR | Status: DC

## 2013-09-01 MED ORDER — HEPARIN SOD (PORK) LOCK FLUSH 100 UNIT/ML IV SOLN
500.0000 [IU] | Freq: Once | INTRAVENOUS | Status: AC | PRN
  Administered 2013-09-01: 500 [IU]
  Filled 2013-09-01: qty 5

## 2013-09-01 MED ORDER — SODIUM CHLORIDE 0.9 % IV SOLN
Freq: Once | INTRAVENOUS | Status: AC
  Administered 2013-09-01: 12:00:00 via INTRAVENOUS

## 2013-09-01 MED ORDER — SODIUM CHLORIDE 0.9 % IV SOLN: 16.0000 mg | Freq: Once | INTRAVENOUS | Status: DC

## 2013-09-01 MED ORDER — SODIUM CHLORIDE 0.9 % IV SOLN
501.5000 mg | Freq: Once | INTRAVENOUS | Status: DC
  Filled 2013-09-01: qty 50

## 2013-09-01 MED ORDER — SODIUM CHLORIDE 0.9 % IV SOLN
Freq: Once | INTRAVENOUS | Status: AC
  Administered 2013-09-01: 16 mg via INTRAVENOUS
  Filled 2013-09-01: qty 8

## 2013-09-01 MED ORDER — STERILE WATER FOR INJECTION IJ SOLN
INTRAMUSCULAR | Status: AC
  Filled 2013-09-01: qty 10

## 2013-09-01 MED ORDER — ALTEPLASE 2 MG IJ SOLR
2.0000 mg | Freq: Once | INTRAMUSCULAR | Status: AC | PRN
  Administered 2013-09-01: 2 mg

## 2013-09-01 MED ORDER — SODIUM CHLORIDE 0.9 % IJ SOLN: 10.0000 mL | INTRAMUSCULAR | Status: DC | PRN

## 2013-09-01 MED ORDER — SODIUM CHLORIDE 0.9 % IV SOLN
15.0000 mg/kg | Freq: Once | INTRAVENOUS | Status: AC
  Administered 2013-09-01: 1150 mg via INTRAVENOUS
  Filled 2013-09-01: qty 46

## 2013-09-01 MED ORDER — SODIUM CHLORIDE 0.9 % IV SOLN
500.0000 mg/m2 | Freq: Once | INTRAVENOUS | Status: AC
  Administered 2013-09-01: 1000 mg via INTRAVENOUS
  Filled 2013-09-01: qty 40

## 2013-09-01 MED ORDER — DENOSUMAB 120 MG/1.7ML ~~LOC~~ SOLN
120.0000 mg | Freq: Once | SUBCUTANEOUS | Status: AC
  Administered 2013-09-01: 120 mg via SUBCUTANEOUS
  Filled 2013-09-01: qty 1.7

## 2013-09-01 NOTE — Progress Notes (Signed)
Tolerated chemo well. 

## 2013-09-01 NOTE — Patient Instructions (Signed)
[facility] Discharge Instructions  RECOMMENDATIONS MADE BY THE CONSULTANT AND ANY TEST RESULTS WILL BE SENT TO YOUR REFERRING PHYSICIAN.  EXAM FINDINGS BY THE PHYSICIAN TODAY AND SIGNS OR SYMPTOMS TO REPORT TO CLINIC OR PRIMARY PHYSICIAN: Exam and findings as discussed by Robynn Pane, PA-C.  Report fevers, chills, uncontrolled nausea, vomiting, shortness of breath or other concerns.  MEDICATIONS PRESCRIBED:  Continue Oscal with vitamin D twice daily Aldactone 50 mg twice daily  INSTRUCTIONS/FOLLOW-UP: Ultrasound guided biopsy as scheduled Follow-up in 1 week with chest xray, labs and office visit.  Get chest xray done before your office visit.  Thank you for choosing [facility] to provide your oncology and hematology care.  To afford each patient quality time with our providers, please arrive at least 15 minutes before your scheduled appointment time.  With your help, our goal is to use those 15 minutes to complete the necessary work-up to ensure our physicians have the information they need to help with your evaluation and healthcare recommendations.    Effective January 1st, 2014, we ask that you re-schedule your appointment with our physicians should you arrive 10 or more minutes late for your appointment.  We strive to give you quality time with our providers, and arriving late affects you and other patients whose appointments are after yours.    Again, thank you for choosing [facility].  Our hope is that these requests will decrease the amount of time that you wait before being seen by our physicians.       _____________________________________________________________  Should you have questions after your visit to [facility], please contact our office at [phone] between the hours of 8:30 a.m. and 4:30 p.m.  Voicemails left after 4:30 p.m. will not be returned until the following business day.  For prescription  refill requests, have your pharmacy contact our office with your prescription refill request.    _______________________________________________________________  We hope that we have given you very good care.  You may receive a patient satisfaction survey in the mail, please complete it and return it as soon as possible.  We value your feedback!  _______________________________________________________________  Have you asked about our STAR program?  STAR stands for Survivorship Training and Rehabilitation, and this is a nationally recognized cancer care program that focuses on survivorship and rehabilitation.  Cancer and cancer treatments may cause problems, such as, pain, making you feel tired and keeping you from doing the things that you need or want to do. Cancer rehabilitation can help. Our goal is to reduce these troubling effects and help you have the best quality of life possible.  You may receive a survey from a nurse that asks questions about your current state of health.  Based on the survey results, all eligible patients will be referred to the [facility] program for an evaluation so we can better serve you!  A frequently asked questions sheet is available upon request.

## 2013-09-01 NOTE — Patient Instructions (Signed)
..  [facility] Discharge Instructions for Patients Receiving Chemotherapy  Today you received the following chemotherapy agents carboplatin and alimpta  To help prevent nausea and vomiting after your treatment, we encourage you to take your nausea medication as Jonathan Goodwin has on your calender Return next week with labs and Dr. Visit.  Report to ED if you have any persistent bleeding that last more than 10 min  Follow the instructions you have been given for stopping xarelto prior to biopsy   If you develop nausea and vomiting, or diarrhea that is not controlled by your medication, call the clinic.  The clinic phone number is [phone]. Office hours are Monday-Friday 8:30am-5:00pm.  BELOW ARE SYMPTOMS THAT SHOULD BE REPORTED IMMEDIATELY:  *FEVER GREATER THAN 101.0 F  *CHILLS WITH OR WITHOUT FEVER  NAUSEA AND VOMITING THAT IS NOT CONTROLLED WITH YOUR NAUSEA MEDICATION  *UNUSUAL SHORTNESS OF BREATH  *UNUSUAL BRUISING OR BLEEDING  TENDERNESS IN MOUTH AND THROAT WITH OR WITHOUT PRESENCE OF ULCERS  *URINARY PROBLEMS  *BOWEL PROBLEMS  UNUSUAL RASH Items with * indicate a potential emergency and should be followed up as soon as possible. If you have an emergency after office hours please contact your primary care physician or go to the nearest emergency department.  Please call the clinic during office hours if you have any questions or concerns.   You may also contact the Patient Navigator at [phone] should you have any questions or need assistance in obtaining follow up care. _____________________________________________________________________ Have you asked about our STAR program?    STAR stands for Survivorship Training and Rehabilitation, and this is a nationally recognized cancer care program that focuses on survivorship and rehabilitation.  Cancer and cancer treatments may cause problems, such as, pain, making you feel tired and keeping you from doing  the things that you need or want to do. Cancer rehabilitation can help. Our goal is to reduce these troubling effects and help you have the best quality of life possible.  You may receive a survey from a nurse that asks questions about your current state of health.  Based on the survey results, all eligible patients will be referred to the [facility] program for an evaluation so we can better serve you! A frequently asked questions sheet is available upon request.

## 2013-09-01 NOTE — Progress Notes (Signed)
LABS DRAWN FOR CBCD,CMP 

## 2013-09-01 NOTE — Progress Notes (Signed)
Jonathan Goodwin presents today for injection per MD orders. xgeva administered SQ in right Abdomen. Administration without incident. Patient tolerated well.

## 2013-09-02 ENCOUNTER — Other Ambulatory Visit: Payer: Self-pay | Admitting: Radiology

## 2013-09-02 MED ORDER — ALBUTEROL SULFATE HFA 108 (90 BASE) MCG/ACT IN AERS: 2.0000 | INHALATION_SPRAY | RESPIRATORY_TRACT | Status: DC | PRN

## 2013-09-02 NOTE — Addendum Note (Signed)
Addended by: Gerhard Perches on: 09/02/2013 09:19 AM   Modules accepted: Orders

## 2013-09-03 ENCOUNTER — Telehealth (HOSPITAL_COMMUNITY): Payer: Self-pay | Admitting: *Deleted

## 2013-09-03 NOTE — Telephone Encounter (Signed)
Patient denies nausea, vomiting, or other GI symptoms. He has had some "unsteadiness" and fell onto the couch without injury. Denies bleeding other than slight nose bleed at times. Home health nurse with patient at time of phone call.

## 2013-09-04 ENCOUNTER — Telehealth (HOSPITAL_COMMUNITY): Payer: Self-pay | Admitting: *Deleted

## 2013-09-04 ENCOUNTER — Other Ambulatory Visit (HOSPITAL_COMMUNITY): Payer: Self-pay | Admitting: *Deleted

## 2013-09-04 DIAGNOSIS — C3491 Malignant neoplasm of unspecified part of right bronchus or lung: Secondary | ICD-10-CM

## 2013-09-04 MED ORDER — FUROSEMIDE 40 MG PO TABS: 40.0000 mg | ORAL_TABLET | Freq: Every day | ORAL | Status: DC

## 2013-09-04 NOTE — Telephone Encounter (Signed)
Patient called in this am to let us know that his legs were more swollen than they were the other day. Patient is not eating much at all. Eating things like broths and drinking juices/tea. Pt has a hospital bed therefore I instructed patient to get in it, elevate the foot of the bed, and put pillows under his legs. I also instructed patient to drink Carnation with milk or Ensure/Boost throughout the day (3-4 cans day) if able. I also instructed patient that there was an additional fluid pill called Lasix/Furosemide that was called in this am. He is to take 1 pill every morning. Call us back on Monday with an update on his edema in BLE. Patient repeated back all instructions. He verbalized understanding of elevating legs but said that getting his legs elevated along with getting comfortable was difficult. I instructed patient to take a pain pill throughout the day for the pain in his legs. Again patient verbalized understanding of instructions.

## 2013-09-08 ENCOUNTER — Ambulatory Visit (HOSPITAL_COMMUNITY): Admission: RE | Admit: 2013-09-08 | Payer: Medicare Other | Source: Ambulatory Visit

## 2013-09-08 ENCOUNTER — Telehealth (HOSPITAL_COMMUNITY): Payer: Self-pay | Admitting: *Deleted

## 2013-09-08 ENCOUNTER — Ambulatory Visit (HOSPITAL_COMMUNITY): Payer: Medicare Other

## 2013-09-08 NOTE — Telephone Encounter (Signed)
Pt missed his ultrasound today. Tom advised me to tell him to stay off his Xarelto tonight in hopes of getting him rescheduled for Thursday or Friday of this week. We will start patient back on Xarelto if patient can't get scheduled this week. Patient repeated back to me to stay off of the Xarelto until we find out when the next appt date will be.

## 2013-09-10 ENCOUNTER — Ambulatory Visit (HOSPITAL_COMMUNITY): Payer: Medicare Other

## 2013-09-10 ENCOUNTER — Encounter (HOSPITAL_COMMUNITY): Payer: Self-pay | Admitting: Emergency Medicine

## 2013-09-10 ENCOUNTER — Encounter (HOSPITAL_COMMUNITY): Payer: Self-pay | Admitting: Pharmacy Technician

## 2013-09-10 ENCOUNTER — Other Ambulatory Visit (HOSPITAL_COMMUNITY): Payer: Medicare Other

## 2013-09-10 ENCOUNTER — Emergency Department (HOSPITAL_COMMUNITY): Payer: Medicare Other

## 2013-09-10 ENCOUNTER — Inpatient Hospital Stay (HOSPITAL_COMMUNITY)
Admission: EM | Admit: 2013-09-10 | Discharge: 2013-09-13 | DRG: 391 | Disposition: A | Discharge status: Hospice | Payer: Medicare Other | Attending: Internal Medicine | Admitting: Internal Medicine

## 2013-09-10 ENCOUNTER — Other Ambulatory Visit: Payer: Self-pay | Admitting: Radiology

## 2013-09-10 DIAGNOSIS — I82629 Acute embolism and thrombosis of deep veins of unspecified upper extremity: Secondary | ICD-10-CM | POA: Diagnosis present

## 2013-09-10 DIAGNOSIS — J189 Pneumonia, unspecified organism: Secondary | ICD-10-CM

## 2013-09-10 DIAGNOSIS — I82623 Acute embolism and thrombosis of deep veins of upper extremity, bilateral: Secondary | ICD-10-CM

## 2013-09-10 DIAGNOSIS — Z86718 Personal history of other venous thrombosis and embolism: Secondary | ICD-10-CM

## 2013-09-10 DIAGNOSIS — N179 Acute kidney failure, unspecified: Secondary | ICD-10-CM

## 2013-09-10 DIAGNOSIS — R17 Unspecified jaundice: Secondary | ICD-10-CM | POA: Diagnosis present

## 2013-09-10 DIAGNOSIS — I2699 Other pulmonary embolism without acute cor pulmonale: Secondary | ICD-10-CM

## 2013-09-10 DIAGNOSIS — E871 Hypo-osmolality and hyponatremia: Secondary | ICD-10-CM

## 2013-09-10 DIAGNOSIS — Z6825 Body mass index (BMI) 25.0-25.9, adult: Secondary | ICD-10-CM

## 2013-09-10 DIAGNOSIS — C7952 Secondary malignant neoplasm of bone marrow: Secondary | ICD-10-CM

## 2013-09-10 DIAGNOSIS — J449 Chronic obstructive pulmonary disease, unspecified: Secondary | ICD-10-CM | POA: Diagnosis present

## 2013-09-10 DIAGNOSIS — C61 Malignant neoplasm of prostate: Secondary | ICD-10-CM

## 2013-09-10 DIAGNOSIS — Z87891 Personal history of nicotine dependence: Secondary | ICD-10-CM

## 2013-09-10 DIAGNOSIS — K5900 Constipation, unspecified: Principal | ICD-10-CM | POA: Diagnosis present

## 2013-09-10 DIAGNOSIS — T451X5A Adverse effect of antineoplastic and immunosuppressive drugs, initial encounter: Secondary | ICD-10-CM | POA: Diagnosis present

## 2013-09-10 DIAGNOSIS — Z86711 Personal history of pulmonary embolism: Secondary | ICD-10-CM

## 2013-09-10 DIAGNOSIS — E860 Dehydration: Secondary | ICD-10-CM

## 2013-09-10 DIAGNOSIS — E43 Unspecified severe protein-calorie malnutrition: Secondary | ICD-10-CM

## 2013-09-10 DIAGNOSIS — C787 Secondary malignant neoplasm of liver and intrahepatic bile duct: Secondary | ICD-10-CM | POA: Diagnosis present

## 2013-09-10 DIAGNOSIS — R109 Unspecified abdominal pain: Secondary | ICD-10-CM | POA: Diagnosis present

## 2013-09-10 DIAGNOSIS — D72829 Elevated white blood cell count, unspecified: Secondary | ICD-10-CM

## 2013-09-10 DIAGNOSIS — Z87442 Personal history of urinary calculi: Secondary | ICD-10-CM

## 2013-09-10 DIAGNOSIS — J9601 Acute respiratory failure with hypoxia: Secondary | ICD-10-CM

## 2013-09-10 DIAGNOSIS — D696 Thrombocytopenia, unspecified: Secondary | ICD-10-CM

## 2013-09-10 DIAGNOSIS — H9190 Unspecified hearing loss, unspecified ear: Secondary | ICD-10-CM | POA: Diagnosis present

## 2013-09-10 DIAGNOSIS — H3530 Unspecified macular degeneration: Secondary | ICD-10-CM | POA: Diagnosis present

## 2013-09-10 DIAGNOSIS — Z515 Encounter for palliative care: Secondary | ICD-10-CM

## 2013-09-10 DIAGNOSIS — C7951 Secondary malignant neoplasm of bone: Secondary | ICD-10-CM

## 2013-09-10 DIAGNOSIS — J4489 Other specified chronic obstructive pulmonary disease: Secondary | ICD-10-CM | POA: Diagnosis present

## 2013-09-10 DIAGNOSIS — K6289 Other specified diseases of anus and rectum: Secondary | ICD-10-CM | POA: Diagnosis present

## 2013-09-10 DIAGNOSIS — J90 Pleural effusion, not elsewhere classified: Secondary | ICD-10-CM

## 2013-09-10 DIAGNOSIS — R0902 Hypoxemia: Secondary | ICD-10-CM

## 2013-09-10 DIAGNOSIS — Z923 Personal history of irradiation: Secondary | ICD-10-CM

## 2013-09-10 DIAGNOSIS — D689 Coagulation defect, unspecified: Secondary | ICD-10-CM

## 2013-09-10 DIAGNOSIS — C3490 Malignant neoplasm of unspecified part of unspecified bronchus or lung: Secondary | ICD-10-CM | POA: Diagnosis present

## 2013-09-10 DIAGNOSIS — Z66 Do not resuscitate: Secondary | ICD-10-CM | POA: Diagnosis present

## 2013-09-10 DIAGNOSIS — J9620 Acute and chronic respiratory failure, unspecified whether with hypoxia or hypercapnia: Secondary | ICD-10-CM | POA: Diagnosis present

## 2013-09-10 DIAGNOSIS — C3491 Malignant neoplasm of unspecified part of right bronchus or lung: Secondary | ICD-10-CM

## 2013-09-10 DIAGNOSIS — D6959 Other secondary thrombocytopenia: Secondary | ICD-10-CM | POA: Diagnosis present

## 2013-09-10 LAB — CBC WITH DIFFERENTIAL/PLATELET
BASOS ABS: 0 10*3/uL (ref 0.0–0.1)
Basophils Relative: 1 % (ref 0–1)
Eosinophils Absolute: 0 10*3/uL (ref 0.0–0.7)
Eosinophils Relative: 1 % (ref 0–5)
HCT: 30.6 % — ABNORMAL LOW (ref 39.0–52.0)
Hemoglobin: 10.1 g/dL — ABNORMAL LOW (ref 13.0–17.0)
LYMPHS ABS: 0.4 10*3/uL — AB (ref 0.7–4.0)
LYMPHS PCT: 11 % — AB (ref 12–46)
MCH: 28.7 pg (ref 26.0–34.0)
MCHC: 33 g/dL (ref 30.0–36.0)
MCV: 86.9 fL (ref 78.0–100.0)
Monocytes Absolute: 0.4 10*3/uL (ref 0.1–1.0)
Monocytes Relative: 10 % (ref 3–12)
NEUTROS PCT: 77 % (ref 43–77)
Neutro Abs: 3 10*3/uL (ref 1.7–7.7)
PLATELETS: 12 10*3/uL — AB (ref 150–400)
RBC: 3.52 MIL/uL — AB (ref 4.22–5.81)
RDW: 16.2 % — ABNORMAL HIGH (ref 11.5–15.5)
WBC: 3.8 10*3/uL — AB (ref 4.0–10.5)

## 2013-09-10 LAB — URINALYSIS, ROUTINE W REFLEX MICROSCOPIC
BILIRUBIN URINE: NEGATIVE
GLUCOSE, UA: NEGATIVE mg/dL
HGB URINE DIPSTICK: NEGATIVE
Ketones, ur: NEGATIVE mg/dL
Leukocytes, UA: NEGATIVE
Nitrite: NEGATIVE
Protein, ur: NEGATIVE mg/dL
UROBILINOGEN UA: 0.2 mg/dL (ref 0.0–1.0)
pH: 5.5 (ref 5.0–8.0)

## 2013-09-10 LAB — COMPREHENSIVE METABOLIC PANEL
ALT: 27 U/L (ref 0–53)
AST: 27 U/L (ref 0–37)
Albumin: 2.1 g/dL — ABNORMAL LOW (ref 3.5–5.2)
Alkaline Phosphatase: 252 U/L — ABNORMAL HIGH (ref 39–117)
Anion gap: 11 (ref 5–15)
BUN: 54 mg/dL — ABNORMAL HIGH (ref 6–23)
CO2: 28 meq/L (ref 19–32)
Calcium: 7.7 mg/dL — ABNORMAL LOW (ref 8.4–10.5)
Chloride: 96 mEq/L (ref 96–112)
Creatinine, Ser: 2.17 mg/dL — ABNORMAL HIGH (ref 0.50–1.35)
GFR calc Af Amer: 34 mL/min — ABNORMAL LOW (ref >90)
GFR calc non Af Amer: 29 mL/min — ABNORMAL LOW (ref >90)
Glucose, Bld: 117 mg/dL — ABNORMAL HIGH (ref 70–99)
Potassium: 5.3 mEq/L (ref 3.7–5.3)
SODIUM: 135 meq/L — AB (ref 137–147)
TOTAL PROTEIN: 6.1 g/dL (ref 6.0–8.3)
Total Bilirubin: 1.7 mg/dL — ABNORMAL HIGH (ref 0.3–1.2)

## 2013-09-10 LAB — LACTIC ACID, PLASMA: LACTIC ACID, VENOUS: 2 mmol/L (ref 0.5–2.2)

## 2013-09-10 MED ORDER — SODIUM CHLORIDE 0.9 % IV SOLN
INTRAVENOUS | Status: DC
  Administered 2013-09-10: via INTRAVENOUS
  Administered 2013-09-11: 50 mL/h via INTRAVENOUS
  Administered 2013-09-11: 22:00:00 via INTRAVENOUS

## 2013-09-10 MED ORDER — VANCOMYCIN HCL IN DEXTROSE 1-5 GM/200ML-% IV SOLN
1000.0000 mg | Freq: Once | INTRAVENOUS | Status: AC
  Administered 2013-09-10: 1000 mg via INTRAVENOUS
  Filled 2013-09-10: qty 200

## 2013-09-10 MED ORDER — ONDANSETRON HCL 4 MG/2ML IJ SOLN: 4.0000 mg | Freq: Four times a day (QID) | INTRAMUSCULAR | Status: DC | PRN

## 2013-09-10 MED ORDER — DEXTROSE 5 % IV SOLN: 1.0000 g | Freq: Three times a day (TID) | INTRAVENOUS | Status: DC

## 2013-09-10 MED ORDER — RIVAROXABAN 20 MG PO TABS
20.0000 mg | ORAL_TABLET | Freq: Every day | ORAL | Status: DC
  Filled 2013-09-10: qty 1

## 2013-09-10 MED ORDER — ONDANSETRON HCL 4 MG PO TABS: 4.0000 mg | ORAL_TABLET | Freq: Four times a day (QID) | ORAL | Status: DC | PRN

## 2013-09-10 MED ORDER — DEXTROSE 5 % IV SOLN
2.0000 g | Freq: Once | INTRAVENOUS | Status: AC
  Administered 2013-09-10: via INTRAVENOUS
  Filled 2013-09-10: qty 2

## 2013-09-10 MED ORDER — ONDANSETRON HCL 4 MG/2ML IJ SOLN
4.0000 mg | Freq: Once | INTRAMUSCULAR | Status: AC
  Administered 2013-09-10: 4 mg via INTRAVENOUS
  Filled 2013-09-10: qty 2

## 2013-09-10 MED ORDER — ACETAMINOPHEN 500 MG PO TABS
1000.0000 mg | ORAL_TABLET | Freq: Three times a day (TID) | ORAL | Status: DC
  Administered 2013-09-11 – 2013-09-12 (×5): 1000 mg via ORAL
  Filled 2013-09-10 (×6): qty 2

## 2013-09-10 MED ORDER — BARIUM SULFATE 2.1 % PO SUSP: 900.0000 mL | Freq: Once | ORAL | Status: DC

## 2013-09-10 MED ORDER — SENNA 8.6 MG PO TABS
1.0000 | ORAL_TABLET | Freq: Two times a day (BID) | ORAL | Status: DC
  Administered 2013-09-11 – 2013-09-12 (×3): 8.6 mg via ORAL
  Filled 2013-09-10 (×7): qty 1

## 2013-09-10 MED ORDER — SODIUM CHLORIDE 0.9 % IV BOLUS (SEPSIS)
1000.0000 mL | Freq: Once | INTRAVENOUS | Status: AC
  Administered 2013-09-10: 1000 mL via INTRAVENOUS

## 2013-09-10 MED ORDER — POLYETHYLENE GLYCOL 3350 17 G PO PACK
17.0000 g | PACK | Freq: Every day | ORAL | Status: DC | PRN
Start: 2013-09-10 — End: 2013-09-12
  Administered 2013-09-11: 17 g via ORAL
  Filled 2013-09-10: qty 1

## 2013-09-10 MED ORDER — ACETAMINOPHEN 650 MG RE SUPP: 650.0000 mg | Freq: Three times a day (TID) | RECTAL | Status: DC

## 2013-09-10 MED ORDER — LIDOCAINE HCL 2 % EX GEL
CUTANEOUS | Status: AC
  Filled 2013-09-10: qty 10

## 2013-09-10 NOTE — ED Notes (Signed)
Pt up to bsc with assistance to have a BM.  States he feels ok standing up.   Pt unable to have BM.  States he feels like his bottom is on fire.

## 2013-09-10 NOTE — ED Notes (Signed)
Patient presents today from home via EMS with a chief complaint of generalized abdominal pain that "feels like constipation" x 2 days. Patient reports last bowel movement was Tuesday this week and has been taking ducolax and mineral oil without relief. Edema also noted to bilateral lower extremities, patient is currently being treated for lung cancer.

## 2013-09-10 NOTE — ED Provider Notes (Signed)
CSN: [id number]     Arrival date & time 09/10/13  1330 History  This chart was scribed for Maudry Diego, MD by Roxan Diesel, ED scribe.  This patient was seen in room [facility]14[facility]14 and the patient's care was started at 2:13 PM.   Chief Complaint  Patient presents with  . Abdominal Pain    Patient is a 68 y.o. male presenting with abdominal pain. The history is provided by the patient. No language interpreter was used.  Abdominal Pain Pain location:  Generalized Pain severity:  Moderate Duration: Several weeks. Timing:  Intermittent Chronicity:  New Associated symptoms: constipation and nausea   Associated symptoms: no chest pain, no cough, no hematuria and no vomiting     HPI Comments: Jonathan Goodwin is a 68 y.o. male with h/o lung cancer w/ bone metastases, prostate cancer s/p radiation therapy, COPD, DVT, PE, and ulcerative colitis who presents to the Emergency Department complaining of intermittent generalized abdominal pain that began several weeks ago.  Pt reports associated poor appetite and constipation.  Currently he also complains of rectal pain described as "like a hot poker up my butt."  He has some nausea but denies vomiting.  He has not seen a GI for this.  Pt states that 2 months ago he "started getting sick" and was diagnosed with DVT in his legs.  He was placed on blood-thinners but stopped taking them on July 24th because he was scheduled for a liver biopsy yesterday at [facility] [address]  He missed his biopsy yesterday because he was late and it is now scheduled for August 4th.    Oncologist is Dr. Marcello Moores.    Past Medical History  Diagnosis Date  . H/O renal calculi   . Urethral stricture   . Wears hearing aid     right ear  . Hearing impaired person, right   . Arthritis   . Macular degeneration   . Ulcerative colitis   . History of kidney stones   . Pulmonary embolus 07/10/2013  . DVT, lower extremity 07/10/2013    LLE  . COPD (chronic obstructive pulmonary  disease)   . Prostate cancer 10/24/07    radiation therapy  . Non-small cell cancer of right lung 08/21/2013    diagnosed by cytology from thorocentesis  . Bone metastases 08/29/2013    Past Surgical History  Procedure Laterality Date  . Knee arthroscopy  1993    right  . Appendectomy  2010    [facility], Dr. Romona Curls  . Shoulder surgery      left, for chronic dislocation  . Cystoscopy  2010    [facility], Dr. Maryland Pink  . Urethrotomy  06/12/2011    Procedure: CYSTOSCOPY/URETHROTOMY;  Surgeon: Marissa Nestle, MD;  Location: [facility];  Service: Urology;  Laterality: N/A;  optical urethrotomy  . Cataract extraction w/phaco Left 01/15/2013    Procedure: CATARACT EXTRACTION PHACO AND INTRAOCULAR LENS PLACEMENT (IOC);  Surgeon: Tonny Branch, MD;  Location: [facility];  Service: Ophthalmology;  Laterality: Left;  CDE:22.42  . Cystoscopy with urethral dilatation N/A 06/30/2013    Procedure: CYSTOSCOPY WITH URETHRAL DILATATION;  Surgeon: Marissa Nestle, MD;  Location: [facility];  Service: Urology;  Laterality: N/A;  . Rectal exam under anesthesia N/A 06/30/2013    Procedure: RECTAL EXAM UNDER ANESTHESIA;  Surgeon: Marissa Nestle, MD;  Location: [facility];  Service: Urology;  Laterality: N/A;  . Urethrotomy N/A 06/30/2013    Procedure: CYSTOSCOPY/URETHROTOMY;  Surgeon: Marissa Nestle, MD;  Location: [facility];  Service: Urology;  Laterality: N/A;  . Cystoscopy 2015    . Portacath placement Left 08/24/2013    Procedure: INSERTION PORT-A-CATH LEFT SUBCLAVIAN;  Surgeon: Jamesetta So, MD;  Location: [facility];  Service: General;  Laterality: Left;    Family History  Problem Relation Age of Onset  . Cancer Mother     colon  . Cancer Paternal Grandmother     colon    History  Substance Use Topics  . Smoking status: Former Smoker -- 1.00 packs/day for 58 years    Types: Cigarettes    Quit date: 07/13/2013  . Smokeless tobacco: Former Systems developer     Comment: trying to quit.  . Alcohol Use: Yes     Comment: occasional      Review of Systems  Constitutional: Positive for appetite change.  HENT: Negative for congestion, ear discharge and sinus pressure.   Eyes: Negative for discharge.  Respiratory: Negative for cough.   Cardiovascular: Negative for chest pain.  Gastrointestinal: Positive for nausea, abdominal pain, constipation and rectal pain. Negative for vomiting.  Genitourinary: Negative for frequency and hematuria.  Musculoskeletal: Negative for back pain.  Skin: Negative for rash.  Neurological: Negative for seizures and headaches.  Psychiatric/Behavioral: Negative for hallucinations.      Allergies  Review of patient's allergies indicates no known allergies.  Home Medications   Prior to Admission medications   Medication Sig Start Date End Date Taking? Authorizing Provider  albuterol (PROAIR HFA) 108 (90 BASE) MCG/ACT inhaler Inhale 2 puffs into the lungs every 4 (four) hours as needed for wheezing or shortness of breath. 09/02/13   Baird Cancer, PA-C  Bevacizumab (AVASTIN IV) Inject 1 each into the vein every 21 ( twenty-one) days. Last administered 09/01/13.    Historical Provider, MD  budesonide-formoterol (SYMBICORT) 160-4.5 MCG/ACT inhaler Inhale 2 puffs into the lungs 2 (two) times daily. 07/13/13   Donita Brooks, NP  CARBOPLATIN IV Inject 1 each into the vein every 21 ( twenty-one) days. Last administered 09/01/2013.    Historical Provider, MD  Denosumab (XGEVA [place]) Inject 1 each into the skin every 28 (twenty-eight) days. Last administered 09/01/13.    Historical Provider, MD  dexamethasone (DECADRON) 4 MG tablet Take 8 mg by mouth See admin instructions. Takes 2 tablets twice daily ONLY on the day before, the day of, and the day after chemo.    Historical Provider, MD  doxycycline (VIBRA-TABS) 100 MG tablet Take 1 tablet (100 mg total) by mouth every 12 (twelve) hours. 08/25/13   Samuella Cota, MD  folic acid (FOLVITE) 1 MG tablet Take 1 tablet (1 mg total) by mouth daily. Start 5-7  days before Alimta chemotherapy. Continue until 21 days after Alimta completed. 08/25/13   Farrel Gobble, MD  furosemide (LASIX) 40 MG tablet Take 1 tablet (40 mg total) by mouth daily. 09/04/13   Baird Cancer, PA-C  lidocaine-prilocaine (EMLA) cream Apply 1 application topically once as needed (Apply quarter-sized amount to port site 1 hour prior to chemo.).    Historical Provider, MD  LORazepam (ATIVAN) 0.5 MG tablet Take 0.5 mg by mouth every 8 (eight) hours as needed for anxiety.    Historical Provider, MD  PEMEtrexed (ALIMTA) 500 MG injection Inject into the vein every 21 ( twenty-one) days.    Historical Provider, MD  PEMEtrexed Disodium (ALIMTA IV) Inject into the vein.    Historical Provider, MD  PEMEtrexed Disodium (ALIMTA IV) Inject 1 each into the vein every 21 (  twenty-one) days. Last administered 09/01/13.    Historical Provider, MD  prochlorperazine (COMPAZINE) 10 MG tablet Take 10 mg by mouth 2 (two) times daily as needed for nausea or vomiting.    Historical Provider, MD  rivaroxaban (XARELTO) 20 MG TABS tablet Take 1 tablet (20 mg total) by mouth daily with supper. 08/02/13   Donita Brooks, NP  spironolactone (ALDACTONE) 50 MG tablet Take 1 tablet (50 mg total) by mouth 2 (two) times daily. 08/31/13   Baird Cancer, PA-C   BP 86/68  Temp(Src) 97.8 F (36.6 C) (Oral)  Resp 18  SpO2 94%  Physical Exam  Nursing note and vitals reviewed. Constitutional: He is oriented to person, place, and time.  Cachectic  HENT:  Head: Normocephalic.  Mouth/Throat: Mucous membranes are dry.  Eyes: Conjunctivae and EOM are normal. No scleral icterus.  Neck: Neck supple. No thyromegaly present.  Cardiovascular: Normal rate and regular rhythm.  Exam reveals no gallop and no friction rub.   No murmur heard. Pulmonary/Chest: No stridor. He has no wheezes. He has no rales. He exhibits no tenderness.  Abdominal: He exhibits no distension. There is tenderness. There is no rebound.  Moderate  RLQ and LLQ tenderness  Musculoskeletal: Normal range of motion. He exhibits edema.  3+ edema in bilateral lower legs  Lymphadenopathy:    He has no cervical adenopathy.  Neurological: He is oriented to person, place, and time.  Skin: No rash noted. No erythema.  Psychiatric: He has a normal mood and affect. His behavior is normal.    ED Course  Procedures (including critical care time)  DIAGNOSTIC STUDIES: Oxygen Saturation is 94% on room air, low by my interpretation.    COORDINATION OF CARE: 2:20 PM-Discussed treatment plan which includes IV fluids with pt at bedside and pt agreed to plan.     Labs Review Labs Reviewed - No data to display  Imaging Review No results found.   EKG Interpretation None      MDM   Final diagnoses:  None    Admit .   The chart was scribed for me under my direct supervision.  I personally performed the history, physical, and medical decision making and all procedures in the evaluation of this patient.Maudry Diego, MD 09/10/13 [phone]

## 2013-09-10 NOTE — ED Notes (Signed)
Patient unable to give urine sample at this time. Patient became real SOB upon standing at the side of the bed. RN aware. Patient assisted back into bed and was told when he was able to give a sample to let the staff know.

## 2013-09-10 NOTE — Progress Notes (Signed)
Pace for Vancomycin & Cefepime Indication: pneumonia  No Known Allergies  Patient Measurements:   Last Recorded Body Weight: 89.4kg on 09/01/13  Vital Signs: Temp: 97.8 F (36.6 C) (07/30 1345) Temp src: Oral (07/30 1345) BP: 96/72 mmHg (07/30 2030) Pulse Rate: 84 (07/30 2030) Intake/Output from previous day:   Intake/Output from this shift:    Labs:  Recent Labs  09/10/13 1425  WBC 3.8*  HGB 10.1*  PLT 12*  CREATININE 2.17*   The CrCl is unknown because both a height and weight (above a minimum accepted value) are required for this calculation. No results found for this basename: VANCOTROUGH, Corlis Leak, VANCORANDOM, GENTTROUGH, GENTPEAK, GENTRANDOM, TOBRATROUGH, TOBRAPEAK, TOBRARND, AMIKACINPEAK, AMIKACINTROU, AMIKACIN,  in the last 72 hours   Microbiology: Recent Results (from the past 720 hour(s))  CULTURE, BLOOD (ROUTINE X 2)     Status: None   Collection Time    08/16/13  9:19 PM      Result Value Ref Range Status   Specimen Description BLOOD RIGHT ANTECUBITAL   Final   Special Requests BOTTLES DRAWN AEROBIC AND ANAEROBIC 12CC   Final   Culture NO GROWTH 5 DAYS   Final   Report Status 08/21/2013 FINAL   Final  CULTURE, BLOOD (ROUTINE X 2)     Status: None   Collection Time    08/16/13  9:19 PM      Result Value Ref Range Status   Specimen Description BLOOD LEFT ANTECUBITAL   Final   Special Requests BOTTLES DRAWN AEROBIC AND ANAEROBIC [place]   Final   Culture NO GROWTH 5 DAYS   Final   Report Status 08/21/2013 FINAL   Final  CULTURE, EXPECTORATED SPUTUM-ASSESSMENT     Status: None   Collection Time    08/17/13  8:48 PM      Result Value Ref Range Status   Specimen Description SPUTUM   Final   Special Requests NONE   Final   Sputum evaluation     Final   Value: THIS SPECIMEN IS ACCEPTABLE. RESPIRATORY CULTURE REPORT TO FOLLOW.     PERFORMED AT [facility]   Report Status 08/17/2013 FINAL   Final  CULTURE, RESPIRATORY  (NON-EXPECTORATED)     Status: None   Collection Time    08/17/13  8:48 PM      Result Value Ref Range Status   Specimen Description SPUTUM   Final   Special Requests NONE   Final   Gram Stain     Final   Value: RARE WBC PRESENT, PREDOMINANTLY MONONUCLEAR     FEW SQUAMOUS EPITHELIAL CELLS PRESENT     MODERATE GRAM POSITIVE COCCI IN PAIRS     IN CLUSTERS FEW GRAM POSITIVE RODS     FEW GRAM NEGATIVE RODS   Culture     Final   Value: NORMAL OROPHARYNGEAL FLORA     Performed at [facility]   Report Status 08/20/2013 FINAL   Final  BODY FLUID CULTURE     Status: None   Collection Time    08/18/13  3:30 PM      Result Value Ref Range Status   Specimen Description PLEURAL FLUID   Final   Special Requests IMMUNE:COMPROMISED   Final   Gram Stain     Final   Value: RARE WBC PRESENT, PREDOMINANTLY PMN     NO ORGANISMS SEEN     Performed at [facility]     Final  Value: FEW PSEUDOMONAS AERUGINOSA     Note: CALLED TO VALDESE DILDY 08/20/13 1140 BY SMITHERSJ     RARE METHICILLIN RESISTANT STAPHYLOCOCCUS AUREUS     Note: RIFAMPIN AND GENTAMICIN SHOULD NOT BE USED AS SINGLE DRUGS FOR TREATMENT OF STAPH INFECTIONS. CRITICAL RESULT CALLED TO, READ BACK BY AND VERIFIED WITH: VAL DESE@1420  ON [id number] BY [place] This organism is presumed to be Clindamycin resistant based on      detection of inducible Clindamycin resistance.     Performed at [facility]   Report Status 08/22/2013 FINAL   Final   Organism ID, Bacteria PSEUDOMONAS AERUGINOSA   Final   Organism ID, Bacteria METHICILLIN RESISTANT STAPHYLOCOCCUS AUREUS   Final  MRSA PCR SCREENING     Status: None   Collection Time    08/21/13  7:35 PM      Result Value Ref Range Status   MRSA by PCR NEGATIVE  NEGATIVE Final   Comment:            The GeneXpert MRSA Assay (FDA     approved for NASAL specimens     only), is one component of a     comprehensive MRSA colonization     surveillance program. It is not      intended to diagnose MRSA     infection nor to guide or     monitor treatment for     MRSA infections.    Anti-infectives   Start     Dose/Rate Route Frequency Ordered Stop   09/10/13 2200  ceFEPIme (MAXIPIME) 1 g in dextrose 5 % 50 mL IVPB  Status:  Discontinued     1 g 100 mL/hr over 30 Minutes Intravenous 3 times per day 09/10/13 2058 09/10/13 2103   09/10/13 2115  ceFEPIme (MAXIPIME) 2 g in dextrose 5 % 50 mL IVPB     2 g 100 mL/hr over 30 Minutes Intravenous  Once 09/10/13 2103     09/10/13 2115  vancomycin (VANCOCIN) IVPB 1000 mg/200 mL premix     1,000 mg 200 mL/hr over 60 Minutes Intravenous  Once 09/10/13 2103        Assessment: 68 yo M with metastatic lung CA currently undergoing chemotherapy presents with abdominal pain, acute renal failure, and pleural effusion.   He was hospitalized earlier this month with pleural effusion- pleural fluid + PSA and MRSA sensitive to Cefepime & Vanc respectively.   Scr is elevated on admission (baseline ~1.1).  Last admission, with renal function at patient's baseline, he had therapeutic Vanc trough on 750mg  IV q12h.    Vancomycin 7/30>> Cefepime 7/30>>  Goal of Therapy:  Vancomycin trough level 15-20 mcg/ml  Plan:  Cefepime 2gm IV q24h Vancomycin 1gm IV x1 then 750mg  IV q12h Check Vancomycin trough at steady state Monitor renal function and cx data   Biagio Borg 09/10/2013,9:14 PM

## 2013-09-10 NOTE — H&P (Signed)
Jonathan Goodwin   Patient name: Jonathan Goodwin Medical record number: [id number] Date of birth: [birth year] Age: 68 y.o. Gender: male  Primary Care Provider: [name], FNP Consultants: none Code Status: DNR  Chief Complaint: Pain   Assessment and Plan: Jonathan Goodwin is a 68 y.o. 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 PMH is significant for COPD, Prostate Ca, DVT/PE and ulcerative colitis  Abdominal Pain: Likely secondary to progression of malignancy complicated by constipation and possible prostatitis. CT showing progression of metastatic disease w/o acute intraabdominal process (pancreatitis, cholecystitis, appendicitis, diverticulitis). No WBC but on chemo. No anal fissures on exam, but large firm stool burden in rectal vault on exam. Prostate ttp  - Admit to med surge - soap suds enema - senokot and miralax  - Zofran PRN nausea - Tylenol 1gm TID (consider adding opioids w/ caution due to slowing GI transit).  Metastatic lung cancer: Progressive disease based on CT scan. Pt actively on chemotherapy, w/ most recent treatment on the 21st. Pt w/ new O2 requirement, likely from effusion and continued cavitation of lung secondary to malignancy. No evidence of COPD exacerbation. Pt unable to mount proper immune response makes MDRHCAP concerning. - Start Cefepime and Vanc  - Consider thoracentesis if respiratory status worsens - Repeat CXR in am  Acute Renal Failure: Cr 2.17 on admission. Baseline around 1.1. 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  - continue Xarelto.   Disposition: pending further improvement  History of Present Illness: Jonathan Goodwin is a 68 y.o. male presenting with abd pain and "butt." Associated w/ dedreased appetite and a sensation of needing to use the bathroom.  Started yesterday. Pt thought all related to constipation as no BM for past 2 days so he tried an enema and suppositories and ducolax w/o relief. Pt endorses SI due to pain at home but not currently. No plan in place. Denies fevers, rash, vomiting, bloody stools, dysuria. July 21st was last Chemo (cafboplatin/alimta/avastin, and Delton See)  Reports 2 recent thoracentesis due to fluid buildup and progressive SOB (last on 08/25/13).    Scheduled for [place] lung Bx on Aug 4th.    Review Of Systems: Per HPI with the following additions: none Otherwise 12 point review of systems was performed and was unremarkable.  Patient Active Problem List   Diagnosis Date Noted  . Dehydration 09/10/2013  . Bone metastases 08/29/2013  . Stage IV NSCLC right lung 08/22/2013  . Pneumonia 08/16/2013  . Acute respiratory failure with hypoxia 08/06/2013  . Leukocytosis 08/05/2013  . Coagulopathy 08/05/2013  . Hypoxemia 08/05/2013  . Acute renal failure 07/24/2013  . HCAP (healthcare-associated pneumonia) 07/24/2013  . COPD (chronic obstructive pulmonary disease)-PFT pending  07/24/2013  . PE (pulmonary embolism) 07/10/2013  . H/O Prostate cancer 10/24/2007   Past Medical History: Past Medical History  Diagnosis Date  . H/O renal calculi   . Urethral stricture   . Wears hearing aid     right ear  . Hearing impaired person, right   . Arthritis   . Macular degeneration   . Ulcerative colitis   . History of kidney stones   . Pulmonary embolus 07/10/2013  . DVT, lower extremity 07/10/2013    LLE  . COPD (chronic obstructive pulmonary disease)   . Prostate cancer 10/24/07    radiation therapy  .  Non-small cell cancer of right lung 08/21/2013    diagnosed by cytology from thorocentesis  . Bone metastases 08/29/2013   Past Surgical History: Past Surgical History  Procedure Laterality Date  . Knee arthroscopy  1993    right  . Appendectomy  2010    [facility], Dr. Romona Curls  . Shoulder surgery      left, for  chronic dislocation  . Cystoscopy  2010    [facility], Dr. Maryland Pink  . Urethrotomy  06/12/2011    Procedure: CYSTOSCOPY/URETHROTOMY;  Surgeon: Marissa Nestle, MD;  Location: [facility];  Service: Urology;  Laterality: N/A;  optical urethrotomy  . Cataract extraction w/phaco Left 01/15/2013    Procedure: CATARACT EXTRACTION PHACO AND INTRAOCULAR LENS PLACEMENT (IOC);  Surgeon: Tonny Branch, MD;  Location: [facility];  Service: Ophthalmology;  Laterality: Left;  CDE:22.42  . Cystoscopy with urethral dilatation N/A 06/30/2013    Procedure: CYSTOSCOPY WITH URETHRAL DILATATION;  Surgeon: Marissa Nestle, MD;  Location: [facility];  Service: Urology;  Laterality: N/A;  . Rectal exam under anesthesia N/A 06/30/2013    Procedure: RECTAL EXAM UNDER ANESTHESIA;  Surgeon: Marissa Nestle, MD;  Location: [facility];  Service: Urology;  Laterality: N/A;  . Urethrotomy N/A 06/30/2013    Procedure: CYSTOSCOPY/URETHROTOMY;  Surgeon: Marissa Nestle, MD;  Location: [facility];  Service: Urology;  Laterality: N/A;  . Cystoscopy 2015    . Portacath placement Left 08/24/2013    Procedure: INSERTION PORT-A-CATH LEFT SUBCLAVIAN;  Surgeon: Jamesetta So, MD;  Location: [facility];  Service: General;  Laterality: Left;   Social History: History  Substance Use Topics  . Smoking status: Former Smoker -- 1.00 packs/day for 58 years    Types: Cigarettes    Quit date: 07/13/2013  . Smokeless tobacco: Former Systems developer     Comment: trying to quit.  . Alcohol Use: Yes     Comment: occasional   Additional social history: none Please also refer to relevant sections of EMR.  Family History: Family History  Problem Relation Age of Onset  . Cancer Mother     colon  . Cancer Paternal Grandmother     colon   Allergies and Medications: No Known Allergies No current facility-administered medications on file prior to encounter.   Current Outpatient Prescriptions on File Prior to Encounter  Medication Sig Dispense Refill  . albuterol (PROAIR HFA)  108 (90 BASE) MCG/ACT inhaler Inhale 2 puffs into the lungs every 4 (four) hours as needed for wheezing or shortness of breath.  1 Inhaler  11  . Bevacizumab (AVASTIN IV) Inject 1 each into the vein every 21 ( twenty-one) days. Last administered 09/01/13.      . budesonide-formoterol (SYMBICORT) 160-4.5 MCG/ACT inhaler Inhale 2 puffs into the lungs 2 (two) times daily.  1 Inhaler  6  . CARBOPLATIN IV Inject 1 each into the vein every 21 ( twenty-one) days. Last administered 09/01/2013.      Marland Kitchen Denosumab (XGEVA [place]) Inject 1 each into the skin every 28 (twenty-eight) days. Last administered 09/01/13.      . folic acid (FOLVITE) 1 MG tablet Take 1 tablet (1 mg total) by mouth daily. Start 5-7 days before Alimta chemotherapy. Continue until 21 days after Alimta completed.  100 tablet  3  . furosemide (LASIX) 40 MG tablet Take 1 tablet (40 mg total) by mouth daily.  60 tablet  2  . PEMEtrexed (ALIMTA) 500 MG injection Inject into the vein every 21 ( twenty-one) days.      Marland Kitchen  rivaroxaban (XARELTO) 20 MG TABS tablet Take 1 tablet (20 mg total) by mouth daily with supper.  30 tablet  3    Objective: BP 109/76  Pulse 105  Temp(Src) 97.8 F (36.6 C) (Oral)  Resp 30  SpO2 93% Exam: General: mild distress, laying in bed HEENT: dry mm, EOMI, PERRL Cardiovascular: RRR, no m/r/g Respiratory: Increase WOB. On 2L [place], diminshed breath sounds on the R w/ diffuse ronchi on the L Abdomen: hypoactiv BS, stool felt in the transverse snd descending colon, mildy ttp diffusely. No murphy sign. No ttp at McBurny's point Extremities: trace LE edema Skin: intact Neuro: CN 2-12 grossly intact Anus w/o fissure, REctal vault w/ large hard stool burden, prostate ttp.   Labs and Imaging: Results for orders placed during the hospital encounter of 09/10/13 (from the past 24 hour(s))  CBC WITH DIFFERENTIAL     Status: Abnormal   Collection Time    09/10/13  2:25 PM      Result Value Ref Range   WBC 3.8 (*) 4.0 - 10.5 K/uL    RBC 3.52 (*) 4.22 - 5.81 MIL/uL   Hemoglobin 10.1 (*) 13.0 - 17.0 g/dL   HCT 30.6 (*) 39.0 - 52.0 %   MCV 86.9  78.0 - 100.0 fL   MCH 28.7  26.0 - 34.0 pg   MCHC 33.0  30.0 - 36.0 g/dL   RDW 16.2 (*) 11.5 - 15.5 %   Platelets 12 (*) 150 - 400 K/uL   Neutrophils Relative % 77  43 - 77 %   Neutro Abs 3.0  1.7 - 7.7 K/uL   Lymphocytes Relative 11 (*) 12 - 46 %   Lymphs Abs 0.4 (*) 0.7 - 4.0 K/uL   Monocytes Relative 10  3 - 12 %   Monocytes Absolute 0.4  0.1 - 1.0 K/uL   Eosinophils Relative 1  0 - 5 %   Eosinophils Absolute 0.0  0.0 - 0.7 K/uL   Basophils Relative 1  0 - 1 %   Basophils Absolute 0.0  0.0 - 0.1 K/uL  COMPREHENSIVE METABOLIC PANEL     Status: Abnormal   Collection Time    09/10/13  2:25 PM      Result Value Ref Range   Sodium 135 (*) 137 - 147 mEq/L   Potassium 5.3  3.7 - 5.3 mEq/L   Chloride 96  96 - 112 mEq/L   CO2 28  19 - 32 mEq/L   Glucose, Bld 117 (*) 70 - 99 mg/dL   BUN 54 (*) 6 - 23 mg/dL   Creatinine, Ser 2.17 (*) 0.50 - 1.35 mg/dL   Calcium 7.7 (*) 8.4 - 10.5 mg/dL   Total Protein 6.1  6.0 - 8.3 g/dL   Albumin 2.1 (*) 3.5 - 5.2 g/dL   AST 27  0 - 37 U/L   ALT 27  0 - 53 U/L   Alkaline Phosphatase 252 (*) 39 - 117 U/L   Total Bilirubin 1.7 (*) 0.3 - 1.2 mg/dL   GFR calc non Af Amer 29 (*) >90 mL/min   GFR calc Af Amer 34 (*) >90 mL/min   Anion gap 11  5 - 15  LACTIC ACID, PLASMA     Status: None   Collection Time    09/10/13  2:25 PM      Result Value Ref Range   Lactic Acid, Venous 2.0  0.5 - 2.2 mmol/L    Ct Abdomen Pelvis Wo Contrast  09/10/2013  CLINICAL DATA:  Abdominal pain.  Metastatic cancer.  EXAM: CT ABDOMEN AND PELVIS WITHOUT CONTRAST  TECHNIQUE: Multidetector CT imaging of the abdomen and pelvis was performed following the standard protocol without IV contrast.  COMPARISON:  CT scan dated 08/07/2013  FINDINGS: The patient has a new large loculated right hydropneumothorax or empyema. Tumor at the right base has markedly progressed  extending from the inferior aspect of the right hilum. There is a small metastatic nodule at the left lung base, 7 mm, stable. Heart size is normal.  Extensive metastases throughout the liver are slightly progressed.  Spleen, pancreas, kidneys, and adrenal glands are stable. There is chronic cholelithiasis with chronic thickening of the gallbladder wall. No dilated bile ducts. Periarticular adenopathy is stable. No acute abnormality of the bowel. Slight diverticulosis of the proximal sigmoid portion of the colon. No free air or free fluid in the abdomen.  Extensive blastic osseous metastases are noted, consistent with prostate cancer. There are severe degenerative changes of the discs at L3-4 and L4-5 with a disc protrusion and gas within the spinal canal at L3-4.  IMPRESSION: 1. Progressive metastatic disease in the liver. 2. New right empyema or loculated hydropneumothorax with marked progression of tumor in consolidation at the right lung base. 3. No other significant change.   Electronically Signed   By: Rozetta Nunnery M.D.   On: 09/10/2013 18:09   Dg Chest 1 View  08/25/2013   CLINICAL DATA:  Post thoracentesis.  EXAM: CHEST - 1 VIEW  COMPARISON:  08/24/2013.  FINDINGS: Decrease in size although incomplete clearance of right-sided pleural effusion post thoracentesis. No pneumothorax detected.  Right lung base infiltrate, atelectasis or mass may be present in addition to right-sided pleural effusion.  Left central line tip proximal superior vena cava level.  Calcified aorta.  Heart size not adequately assessed.  IMPRESSION: No pneumothorax detected post right-sided thoracentesis.  Please see above.   Electronically Signed   By: Chauncey Cruel M.D.   On: 08/25/2013 11:50   Dg Chest 1 View  08/18/2013   CLINICAL DATA:  Post RIGHT thoracentesis  EXAM: CHEST - 1 VIEW  COMPARISON:  08/16/2013 ; correlation CT chest 08/16/2013  FINDINGS: Enlargement of cardiac silhouette.  Atherosclerotic calcification aorta.   Mediastinal contours and pulmonary vascularity normal.  Minimal LEFT basilar atelectasis.  Underlying emphysematous changes.  Decrease in RIGHT pleural effusion versus prior CT, post thoracentesis.  Persistent atelectasis and/or consolidation of the RIGHT middle and RIGHT lower lobes ; unable to exclude underlying abnormalities including mass with this appearance.  Tiny RIGHT apex pneumothorax new since previous exam.  No acute osseous findings.  IMPRESSION: Decrease in RIGHT pleural effusion post thoracentesis though RIGHT pleural effusion and atelectasis versus consolidation of the RIGHT middle and RIGHT lower lobes persists.  Tiny RIGHT apex pneumothorax post thoracentesis.  Underlying COPD with minimal LEFT basilar atelectasis.  Critical Value/emergent results were called by telephone at the time of interpretation on 08/18/2013 at 4:16 PM to Dr. Ree Kida, who verbally acknowledged these results.   Electronically Signed   By: Lavonia Dana M.D.   On: 08/18/2013 16:16   Ct Head W Wo Contrast  08/17/2013   CLINICAL DATA:  Staging for metastatic disease.  Unknown primary.  EXAM: CT HEAD WITHOUT AND WITH CONTRAST  TECHNIQUE: Contiguous axial images were obtained from the base of the skull through the vertex without and with intravenous contrast  CONTRAST:  73mL OMNIPAQUE IOHEXOL 300 MG/ML  SOLN  COMPARISON:  None.  FINDINGS: No  evidence for acute infarction, hemorrhage, mass lesion, hydrocephalus, or extra-axial fluid. Mild atrophy. Mild chronic microvascular ischemic change. Post infusion, no abnormal enhancement of the brain or meninges. Vascular calcification. No acute sinus or mastoid disease. No osseous lesions.  IMPRESSION: Chronic changes as described. No acute intracranial abnormality. No enhancing lesions to suggest metastatic disease.   Electronically Signed   By: Rolla Flatten M.D.   On: 08/17/2013 17:21   Ct Angio Chest Pe W/cm &/or Wo Cm  08/16/2013   CLINICAL DATA:  Shortness of breath.  EXAM: CT  ANGIOGRAPHY CHEST WITH CONTRAST  TECHNIQUE: Multidetector CT imaging of the chest was performed using the standard protocol during bolus administration of intravenous contrast. Multiplanar CT image reconstructions and MIPs were obtained to evaluate the vascular anatomy.  CONTRAST:  131mL OMNIPAQUE IOHEXOL 350 MG/ML SOLN  COMPARISON:  08/05/2013  FINDINGS: Small amount of residual eccentric chronic what again seen within the main right pulmonary artery and proximal right lower lobe pulmonary artery, not significantly changed. No new pulmonary embolus.  Increasing right pleural effusion, now moderate. Continued volume loss and air bronchograms within the right middle lobe, stable. Increasing airspace disease in the right lower lobe. Moderate emphysematous changes. Minimal scattered opacities noted in the left lower lobe at the left lung base and in the lingula.  Small nodule in the lingula measures 5 mm on image 60, stable. Adenopathy in the mediastinum and hila again noted, unchanged. AP window lymph node has a short axis diameter of 16 mm on image 37. Left hilar lymph node has a short axis diameter of 14 mm on image 41. Left hilar lymph node on image 53 as a short axis diameter of 19 mm. Other enlarged AP window, left hilar and paratracheal lymph nodes are also stable.  Heart is borderline in size. Coronary artery calcifications. Aorta is normal caliber.  Imaging into the upper abdomen again demonstrates multiple low-density lesions throughout the liver compatible with metastases, likely not significantly changed. Sclerotic foci throughout the thoracic spine are stable, likely bone metastases.  Review of the MIP images confirms the above findings.  IMPRESSION: Stable chronic small pulmonary embolus in the right main and lower lobe pulmonary arteries. This is nonocclusive. No new pulmonary embolus.  Enlarging right pleural effusion with increasing right lower lobe consolidation. Stable volume loss and air  bronchograms in the medial right middle lobe.  Stable mediastinal and left hilar adenopathy compatible with metastatic disease. Stable multiple ill-defined hepatic metastases. Stable sclerotic bone metastases.   Electronically Signed   By: Rolm Baptise M.D.   On: 08/16/2013 18:26   Dg Chest Portable 1 View  09/10/2013   CLINICAL DATA:  Swelling in lower extremity. Shortness of breath increasing since early July. History of prostate and right lung cancer. COPD. Port-A-Cath.  EXAM: PORTABLE CHEST - 1 VIEW  COMPARISON:  Chest x-ray on 09/10/2013, chest CT on 08/16/2013  FINDINGS: Heart is enlarged. There is right lung base opacity. There has been development of cavitary appearance of the lateral right lung base, warranting further evaluation CT of the chest. Patient has a left-sided Port-A-Cath with tip overlying the level of the superior vena cava. Left lung is essentially clear.  IMPRESSION: 1. Interval development of cavitary appearance of the lateral right lung base. 2. Further evaluation a CT of the chest with contrast is recommended.   Electronically Signed   By: Shon Hale M.D.   On: 09/10/2013 18:05   Dg Chest Port 1 View  08/24/2013   CLINICAL  DATA:  Port-A-Cath placement.  EXAM: PORTABLE CHEST - 1 VIEW  COMPARISON:  08/18/2013  FINDINGS: Power injectable left subclavian Port-A-Cath terminates in the upper SVC near the brachiocephalic confluence. No pneumothorax observed.  Large right pleural effusion fills 2/3 of the right hemithorax. Interstitial accentuation in the right chest.  Platelike atelectasis at the left lung base. Right heart border obscured.  IMPRESSION: 1. Port-A-Cath tip: Upper SVC near the brachiocephalic confluence. No pneumothorax or complicating feature. 2. Large right pleural effusion with indistinct interstitial opacity in the remaining aerated portion of the right upper lobe. 3. Platelike atelectasis, left lung base.   Electronically Signed   By: Sherryl Barters M.D.   On:  08/24/2013 14:50   Dg Chest Port 1 View  08/16/2013   CLINICAL DATA:  SHORTNESS OF BREATH  EXAM: PORTABLE CHEST - 1 VIEW  COMPARISON:  Portable chest radiograph 08/05/2013  FINDINGS: Visualized cardiac silhouette stable. Persistent right lower lobe opacity with increased conspicuity silhouetting the right heart border. There is prominence of interstitial markings. Stable fullness in the left hilar region. No new focal regions of consolidation or new focal infiltrates. Osteoarthritic changes in the shoulders. No acute osseous abnormalities.  IMPRESSION: Stable pulmonary opacity right lower lobe. Differential considerations again include pneumonia, pulmonary hemorrhage, component of atelectasis.  Chest radiograph otherwise stable.   Electronically Signed   By: Margaree Mackintosh M.D.   On: 08/16/2013 15:35   Dg C-arm 1-60 Min-no Report  08/27/2013   : Fluoroscopy was utilized by the requesting physician. No radiographic interpretation.   Electronically Signed   By: Porfirio Mylar   On: 08/27/2013 17:02   US Thoracentesis Asp Pleural Space W/img Guide  08/25/2013   CLINICAL DATA:  Increased RIGHT pleural effusion  EXAM: US GUIDED RIGHT THORACENTESIS  TECHNIQUE: Procedure, benefits, and risks of procedure were discussed with patient.  Written informed consent for procedure was obtained.  Time out protocol followed.  Pleural effusion localized at the posterior RIGHT hemi thorax.  Skin prepped and draped in usual sterile fashion.  Skin and soft tissues anesthetized with 8 mL of 1% lidocaine.  8 French thoracentesis catheter placed into the RIGHT pleural space.  1500 mL of grossly bloody fluid aspirated by syringe pump.  Procedure tolerated well by patient without immediate complication.  COMPARISON:  08/18/2013  FINDINGS: As above  IMPRESSION: Ultrasound-guided RIGHT thoracentesis with removal of 1500 mL of grossly bloody fluid from the RIGHT hemi thorax.  No pneumothorax on post procedural chest radiograph, reported  separately.   Electronically Signed   By: Lavonia Dana M.D.   On: 08/25/2013 13:48   US Thoracentesis Asp Pleural Space W/img Guide  08/18/2013   CLINICAL DATA:  Shortness of breath, moderate-sized RIGHT pleural effusion  EXAM: US THORACENTESIS ASP PLEURAL SPACE W/IMG GUIDE  TECHNIQUE: Procedure, benefits, and risks of procedure were discussed with patient.  Written informed consent for procedure was obtained.  Time out protocol followed.  Pleural effusion localized at the posterior RIGHT hemi thorax.  Skin prepped and draped in usual sterile fashion.  Skin and soft tissues anesthetized with 7 mL of 1% lidocaine.  8 French thoracentesis catheter placed into the RIGHT pleural space.  800 mL of dark old appearing bloody fluid aspirated by syringe pump.  Procedure tolerated well by patient.  Tiny RIGHT apex pneumothorax on postprocedural chest radiograph.  COMPARISON:  Chest CT 08/16/2013  FINDINGS: As above  IMPRESSION: Ultrasound guided RIGHT thoracentesisyielding 800 mL of dark old bloody fluid as above.  [place]  mL of fluid sent to laboratory for requested analysis.  Tiny RIGHT apex pneumothorax on postprocedural chest radiograph, reported separately.   Electronically Signed   By: Lavonia Dana M.D.   On: 08/18/2013 17:01     Waldemar Dickens, MD Family Medicine 09/10/2013, 7:45 PM [facility]

## 2013-09-10 NOTE — ED Notes (Signed)
Patient having labored breathing with ambulation to bedside commode. O2 sat 89% on 3 liters via . Patient denies wearing oxygen at home. O2 set at 4 Liters O2 sat now 93%. Patient c/o pain in rectum with attempt to have bowel movement. Dr Roderic Palau made aware of both labored breathing and c/o pain.

## 2013-09-11 ENCOUNTER — Observation Stay (HOSPITAL_COMMUNITY): Payer: Medicare Other

## 2013-09-11 DIAGNOSIS — T451X5A Adverse effect of antineoplastic and immunosuppressive drugs, initial encounter: Secondary | ICD-10-CM | POA: Diagnosis present

## 2013-09-11 DIAGNOSIS — J9600 Acute respiratory failure, unspecified whether with hypoxia or hypercapnia: Secondary | ICD-10-CM

## 2013-09-11 DIAGNOSIS — C3490 Malignant neoplasm of unspecified part of unspecified bronchus or lung: Secondary | ICD-10-CM

## 2013-09-11 DIAGNOSIS — I2699 Other pulmonary embolism without acute cor pulmonale: Secondary | ICD-10-CM | POA: Diagnosis present

## 2013-09-11 DIAGNOSIS — C787 Secondary malignant neoplasm of liver and intrahepatic bile duct: Secondary | ICD-10-CM | POA: Diagnosis present

## 2013-09-11 DIAGNOSIS — D6959 Other secondary thrombocytopenia: Secondary | ICD-10-CM | POA: Diagnosis present

## 2013-09-11 DIAGNOSIS — C7951 Secondary malignant neoplasm of bone: Secondary | ICD-10-CM | POA: Diagnosis present

## 2013-09-11 DIAGNOSIS — J9620 Acute and chronic respiratory failure, unspecified whether with hypoxia or hypercapnia: Secondary | ICD-10-CM | POA: Diagnosis present

## 2013-09-11 DIAGNOSIS — E871 Hypo-osmolality and hyponatremia: Secondary | ICD-10-CM | POA: Diagnosis present

## 2013-09-11 DIAGNOSIS — Z6825 Body mass index (BMI) 25.0-25.9, adult: Secondary | ICD-10-CM | POA: Diagnosis not present

## 2013-09-11 DIAGNOSIS — Z86718 Personal history of other venous thrombosis and embolism: Secondary | ICD-10-CM | POA: Diagnosis not present

## 2013-09-11 DIAGNOSIS — D696 Thrombocytopenia, unspecified: Secondary | ICD-10-CM | POA: Diagnosis present

## 2013-09-11 DIAGNOSIS — Z515 Encounter for palliative care: Secondary | ICD-10-CM | POA: Diagnosis not present

## 2013-09-11 DIAGNOSIS — K6289 Other specified diseases of anus and rectum: Secondary | ICD-10-CM | POA: Diagnosis present

## 2013-09-11 DIAGNOSIS — J189 Pneumonia, unspecified organism: Secondary | ICD-10-CM

## 2013-09-11 DIAGNOSIS — R17 Unspecified jaundice: Secondary | ICD-10-CM | POA: Diagnosis present

## 2013-09-11 DIAGNOSIS — N179 Acute kidney failure, unspecified: Secondary | ICD-10-CM

## 2013-09-11 DIAGNOSIS — I82629 Acute embolism and thrombosis of deep veins of unspecified upper extremity: Secondary | ICD-10-CM | POA: Diagnosis present

## 2013-09-11 DIAGNOSIS — H9190 Unspecified hearing loss, unspecified ear: Secondary | ICD-10-CM | POA: Diagnosis present

## 2013-09-11 DIAGNOSIS — Z87891 Personal history of nicotine dependence: Secondary | ICD-10-CM | POA: Diagnosis not present

## 2013-09-11 DIAGNOSIS — Z923 Personal history of irradiation: Secondary | ICD-10-CM | POA: Diagnosis not present

## 2013-09-11 DIAGNOSIS — J90 Pleural effusion, not elsewhere classified: Secondary | ICD-10-CM | POA: Diagnosis not present

## 2013-09-11 DIAGNOSIS — R109 Unspecified abdominal pain: Secondary | ICD-10-CM | POA: Diagnosis present

## 2013-09-11 DIAGNOSIS — E860 Dehydration: Secondary | ICD-10-CM

## 2013-09-11 DIAGNOSIS — Z66 Do not resuscitate: Secondary | ICD-10-CM | POA: Diagnosis present

## 2013-09-11 DIAGNOSIS — K5900 Constipation, unspecified: Secondary | ICD-10-CM | POA: Diagnosis present

## 2013-09-11 DIAGNOSIS — H3530 Unspecified macular degeneration: Secondary | ICD-10-CM | POA: Diagnosis present

## 2013-09-11 DIAGNOSIS — Z87442 Personal history of urinary calculi: Secondary | ICD-10-CM | POA: Diagnosis not present

## 2013-09-11 DIAGNOSIS — J449 Chronic obstructive pulmonary disease, unspecified: Secondary | ICD-10-CM | POA: Diagnosis present

## 2013-09-11 DIAGNOSIS — Z86711 Personal history of pulmonary embolism: Secondary | ICD-10-CM | POA: Diagnosis not present

## 2013-09-11 DIAGNOSIS — E43 Unspecified severe protein-calorie malnutrition: Secondary | ICD-10-CM | POA: Diagnosis present

## 2013-09-11 LAB — COMPREHENSIVE METABOLIC PANEL
ALBUMIN: 1.8 g/dL — AB (ref 3.5–5.2)
ALT: 22 U/L (ref 0–53)
ANION GAP: 11 (ref 5–15)
AST: 26 U/L (ref 0–37)
Alkaline Phosphatase: 219 U/L — ABNORMAL HIGH (ref 39–117)
BILIRUBIN TOTAL: 1.3 mg/dL — AB (ref 0.3–1.2)
BUN: 45 mg/dL — AB (ref 6–23)
CALCIUM: 6.8 mg/dL — AB (ref 8.4–10.5)
CO2: 26 mEq/L (ref 19–32)
CREATININE: 1.87 mg/dL — AB (ref 0.50–1.35)
Chloride: 99 mEq/L (ref 96–112)
GFR calc Af Amer: 41 mL/min — ABNORMAL LOW (ref >90)
GFR calc non Af Amer: 35 mL/min — ABNORMAL LOW (ref >90)
Glucose, Bld: 120 mg/dL — ABNORMAL HIGH (ref 70–99)
Potassium: 4.7 mEq/L (ref 3.7–5.3)
Sodium: 136 mEq/L — ABNORMAL LOW (ref 137–147)
TOTAL PROTEIN: 5.5 g/dL — AB (ref 6.0–8.3)

## 2013-09-11 LAB — CBC
HEMATOCRIT: 25.8 % — AB (ref 39.0–52.0)
Hemoglobin: 8.7 g/dL — ABNORMAL LOW (ref 13.0–17.0)
MCH: 29.4 pg (ref 26.0–34.0)
MCHC: 33.7 g/dL (ref 30.0–36.0)
MCV: 87.2 fL (ref 78.0–100.0)
Platelets: <30 10*3/uL — CL (ref 150–400)
RBC: 2.96 MIL/uL — ABNORMAL LOW (ref 4.22–5.81)
RDW: 16.4 % — ABNORMAL HIGH (ref 11.5–15.5)
WBC: 3.8 10*3/uL — ABNORMAL LOW (ref 4.0–10.5)

## 2013-09-11 LAB — PROTIME-INR
INR: 1.23 (ref 0.00–1.49)
PROTHROMBIN TIME: 15.5 s — AB (ref 11.6–15.2)

## 2013-09-11 LAB — HIV ANTIBODY (ROUTINE TESTING W REFLEX): HIV 1&2 Ab, 4th Generation: NONREACTIVE

## 2013-09-11 LAB — APTT: APTT: 31 s (ref 24–37)

## 2013-09-11 LAB — STREP PNEUMONIAE URINARY ANTIGEN: Strep Pneumo Urinary Antigen: NEGATIVE

## 2013-09-11 MED ORDER — POLYETHYLENE GLYCOL 3350 17 G PO PACK
17.0000 g | PACK | Freq: Two times a day (BID) | ORAL | Status: DC
  Administered 2013-09-11 – 2013-09-12 (×2): 17 g via ORAL
  Filled 2013-09-11 (×2): qty 1

## 2013-09-11 MED ORDER — DEXTROSE 5 % IV SOLN
2.0000 g | INTRAVENOUS | Status: DC
  Administered 2013-09-11: 2 g via INTRAVENOUS
  Filled 2013-09-11 (×3): qty 2

## 2013-09-11 MED ORDER — VANCOMYCIN HCL IN DEXTROSE 750-5 MG/150ML-% IV SOLN
750.0000 mg | Freq: Two times a day (BID) | INTRAVENOUS | Status: DC
  Administered 2013-09-11 – 2013-09-12 (×3): 750 mg via INTRAVENOUS
  Filled 2013-09-11 (×6): qty 150

## 2013-09-11 NOTE — Care Management Note (Addendum)
    Page 1 of 2   09/15/2013     3:38:46 PM CARE MANAGEMENT NOTE 09/29/2013  Patient:  Jonathan Goodwin, Jonathan Goodwin   Account Number:  [id number]  Date Initiated:  09/11/2013  Documentation initiated by:  Jolene Provost  Subjective/Objective Assessment:   Patient from Home with wife. patient is Active with [facility] for PT and RN services.Patient has cane, walker, and home oxygen through [facility] also. Patient requests wheelchair as he has become too weak to ambulate safely with walker. Pt choose [facility].     Action/Plan:   Patient plans to return home with [facility] serves at D/C. Emma with [facility] notified of DME needs. Orders placed for resumption of [place] services at DC. Directions given to RN on how to notify [facility] that patient has been discharged, if D/C this weekend.   Anticipated DC Date:     Anticipated DC Plan:  [place]  CM consult      [facility] Choice  Resumption Of Svcs/PTA Provider  [place]   Choice offered to / List presented to:     DME arranged  [place]      DME agency  [place] arranged  HH-1 RN  [place]      [facility] agency  [facility]   Status of service:  Completed, signed off Medicare Important Message given?  YES (If response is "NO", the following Medicare IM given date fields will be blank) Date Medicare IM given:  09/11/2013 Medicare IM given by:  Theophilus Kinds Date Additional Medicare IM given:   Additional Medicare IM given by:    Discharge Disposition:  [place]  Per UR Regulation:    If discussed at Long Length of Stay Meetings, dates discussed:    Comments:  09/16/2013 08:30 CM called Shone Leventhal [phone] (wife of pt) who states she really would like to get her husband home with hospice today.  Meryl Crutch was at the pharmacy ([facility] waiting for it to open to get the prescriptions filled. Meryl Crutch chooses [facility] states she has all the  equipment she thinks she needs.  CM called [facility] and received callback from [place] who states she will do her best to get this family home today.  bonnie has requested I fax facesheet, Hospice consult, H&P, DC summary.  CM  faxed requested information; gave Rande Brunt contact information.  No other CM needs were communicated.  Mariane Masters, BSN, [place].   09/11/2013 Jolene Provost, RN, MSN, PCCN.

## 2013-09-11 NOTE — Progress Notes (Signed)
[facility] PROGRESS NOTE  Assessment/Plan: AKI (acute kidney injury) - Started on NS, Cr improving. - Cont IV fluid, hold lasix.   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 Sating > 90 on 4L   Constipation: - enema and miralax .   Thrombocytopenia: - probably due to chemo. now improving - hold xarelto due to high risk of bleeding , PLt's 12, no signs of bleeding. - I have explained the risk and benefit of ongoing anticoagulation with this low of PLt's count   Code Status: none Family Communication: none  Disposition Plan: inpatient   Consultants:  none  Procedures:  CT abd  Antibiotics:  vanc and cefepime  HPI/Subjective: SOB unchanged  Objective: Filed Vitals:   09/10/13 2000 09/10/13 2030 09/10/13 2100 09/11/13 0420  BP: 103/74 96/72 92/63  99/68  Pulse: 83 84 91 77  Temp:   97.8 F (36.6 C) 97.8 F (36.6 C)  TempSrc:   Oral Oral  Resp: 26 21 20 20   Height:   6\' 2"  (1.88 m)   Weight:   90.4 kg (199 lb 4.7 oz)   SpO2: 87% 85% 99% 95%    Intake/Output Summary (Last 24 hours) at 09/11/13 1323 Last data filed at 09/11/13 0600  Gross per 24 hour  Intake 563.33 ml  Output    800 ml  Net -236.67 ml   Filed Weights   09/10/13 2100  Weight: 90.4 kg (199 lb 4.7 oz)    Exam:  General: Alert, awake, oriented x3, in no acute distress.  HEENT: No bruits, no goiter.  Heart: Regular rate and rhythm. Lungs: Good air movement, crackles on the right Abdomen: Soft, nontender, nondistended, positive bowel sounds.    Data Reviewed: Basic Metabolic Panel:  Recent Labs Lab 09/10/13 1425 09/11/13 0504  NA 135* 136*  K 5.3 4.7  CL 96 99  CO2 28 26  GLUCOSE 117* 120*  BUN 54* 45*  CREATININE 2.17* 1.87*  CALCIUM 7.7* 6.8*   Liver Function Tests:  Recent Labs Lab 09/10/13 1425 09/11/13 0504  AST 27 26  ALT 27 22  ALKPHOS 252* 219*  BILITOT 1.7* 1.3*  PROT 6.1  5.5*  ALBUMIN 2.1* 1.8*   No results found for this basename: LIPASE, AMYLASE,  in the last 168 hours No results found for this basename: AMMONIA,  in the last 168 hours CBC:  Recent Labs Lab 09/10/13 1425 09/11/13 0504  WBC 3.8* 3.8*  NEUTROABS 3.0  --   HGB 10.1* 8.7*  HCT 30.6* 25.8*  MCV 86.9 87.2  PLT 12* <30*   Cardiac Enzymes: No results found for this basename: CKTOTAL, CKMB, CKMBINDEX, TROPONINI,  in the last 168 hours BNP (last 3 results)  Recent Labs  07/10/13 1014 08/05/13 1856 08/16/13 1509  PROBNP 1349.0* 204.3* 230.8*   CBG: No results found for this basename: GLUCAP,  in the last 168 hours  Recent Results (from the past 240 hour(s))  CULTURE, BLOOD (ROUTINE X 2)     Status: None   Collection Time    09/10/13  9:39 PM      Result Value Ref Range Status   Specimen Description BLOOD RIGHT ANTECUBITAL   Final   Special Requests BOTTLES DRAWN AEROBIC AND ANAEROBIC 6CC   Final   Culture PENDING   Incomplete   Report Status PENDING   Incomplete  CULTURE, BLOOD (ROUTINE X 2)     Status: None  Collection Time    09/10/13  9:39 PM      Result Value Ref Range Status   Specimen Description BLOOD RIGHT ANTECUBITAL   Final   Special Requests BOTTLES DRAWN AEROBIC AND ANAEROBIC [facility] EACH   Final   Culture PENDING   Incomplete   Report Status PENDING   Incomplete     Studies: Ct Abdomen Pelvis Wo Contrast  09/10/2013   CLINICAL DATA:  Abdominal pain.  Metastatic cancer.  EXAM: CT ABDOMEN AND PELVIS WITHOUT CONTRAST  TECHNIQUE: Multidetector CT imaging of the abdomen and pelvis was performed following the standard protocol without IV contrast.  COMPARISON:  CT scan dated 08/07/2013  FINDINGS: The patient has a new large loculated right hydropneumothorax or empyema. Tumor at the right base has markedly progressed extending from the inferior aspect of the right hilum. There is a small metastatic nodule at the left lung base, 7 mm, stable. Heart size is normal.   Extensive metastases throughout the liver are slightly progressed.  Spleen, pancreas, kidneys, and adrenal glands are stable. There is chronic cholelithiasis with chronic thickening of the gallbladder wall. No dilated bile ducts. Periarticular adenopathy is stable. No acute abnormality of the bowel. Slight diverticulosis of the proximal sigmoid portion of the colon. No free air or free fluid in the abdomen.  Extensive blastic osseous metastases are noted, consistent with prostate cancer. There are severe degenerative changes of the discs at L3-4 and L4-5 with a disc protrusion and gas within the spinal canal at L3-4.  IMPRESSION: 1. Progressive metastatic disease in the liver. 2. New right empyema or loculated hydropneumothorax with marked progression of tumor in consolidation at the right lung base. 3. No other significant change.   Electronically Signed   By: Rozetta Nunnery M.D.   On: 09/10/2013 18:09   Portable Chest 1 View  09/11/2013   CLINICAL DATA:  Pneumonia and pleural effusion. History of lung cancer with bone metastases, prostate cancer, P, and port placement.  EXAM: PORTABLE CHEST - 1 VIEW  COMPARISON:  09/10/2013  FINDINGS: Heart size and pulmonary vascularity appear normal. Power port type left central venous catheter is unchanged in position since previous study. Persistent cavitary lesion versus loculated pneumothorax again demonstrated in the right costophrenic angle with associated atelectasis or consolidation. Linear atelectasis or fibrosis in the left lung base. No significant change since prior study.  IMPRESSION: No change since previous study.   Electronically Signed   By: Lucienne Capers M.D.   On: 09/11/2013 05:35   Dg Chest Portable 1 View  09/10/2013   CLINICAL DATA:  Swelling in lower extremity. Shortness of breath increasing since early July. History of prostate and right lung cancer. COPD. Port-A-Cath.  EXAM: PORTABLE CHEST - 1 VIEW  COMPARISON:  Chest x-ray on 09/10/2013, chest CT  on 08/16/2013  FINDINGS: Heart is enlarged. There is right lung base opacity. There has been development of cavitary appearance of the lateral right lung base, warranting further evaluation CT of the chest. Patient has a left-sided Port-A-Cath with tip overlying the level of the superior vena cava. Left lung is essentially clear.  IMPRESSION: 1. Interval development of cavitary appearance of the lateral right lung base. 2. Further evaluation a CT of the chest with contrast is recommended.   Electronically Signed   By: Shon Hale M.D.   On: 09/10/2013 18:05    Scheduled Meds: . acetaminophen  1,000 mg Oral TID   Or  . acetaminophen  650 mg Rectal TID  .  ceFEPime (MAXIPIME) IV  2 g Intravenous Q24H  . rivaroxaban  20 mg Oral Q supper  . senna  1 tablet Oral BID  . vancomycin  750 mg Intravenous Q12H   Continuous Infusions: . sodium chloride 100 mL/hr at 09/11/13 0100     Charlynne Cousins  [facility] Pager [phone]. If 8PM-8AM, please contact night-coverage at [email or web address], password [facility] 09/11/2013, 1:23 PM  LOS: 1 day      **Disclaimer: This note may have been dictated with voice recognition software. Similar sounding words can inadvertently be transcribed and this note may contain transcription errors which may not have been corrected upon publication of note.**

## 2013-09-11 NOTE — Progress Notes (Signed)
Per lab platelets <30 Dr. Darrick Meigs text paged the results.

## 2013-09-11 NOTE — Progress Notes (Signed)
Utilization Review Completed.Donne Anon T7/31/2015

## 2013-09-12 ENCOUNTER — Inpatient Hospital Stay (HOSPITAL_COMMUNITY): Payer: Medicare Other

## 2013-09-12 DIAGNOSIS — I2699 Other pulmonary embolism without acute cor pulmonale: Secondary | ICD-10-CM

## 2013-09-12 DIAGNOSIS — I82629 Acute embolism and thrombosis of deep veins of unspecified upper extremity: Secondary | ICD-10-CM | POA: Diagnosis present

## 2013-09-12 DIAGNOSIS — E43 Unspecified severe protein-calorie malnutrition: Secondary | ICD-10-CM | POA: Diagnosis present

## 2013-09-12 DIAGNOSIS — J90 Pleural effusion, not elsewhere classified: Secondary | ICD-10-CM | POA: Diagnosis present

## 2013-09-12 LAB — BASIC METABOLIC PANEL
Anion gap: 11 (ref 5–15)
BUN: 37 mg/dL — ABNORMAL HIGH (ref 6–23)
CHLORIDE: 102 meq/L (ref 96–112)
CO2: 25 mEq/L (ref 19–32)
Calcium: 6.5 mg/dL — ABNORMAL LOW (ref 8.4–10.5)
Creatinine, Ser: 1.38 mg/dL — ABNORMAL HIGH (ref 0.50–1.35)
GFR, EST AFRICAN AMERICAN: 59 mL/min — AB (ref >90)
GFR, EST NON AFRICAN AMERICAN: 51 mL/min — AB (ref >90)
Glucose, Bld: 128 mg/dL — ABNORMAL HIGH (ref 70–99)
POTASSIUM: 4.4 meq/L (ref 3.7–5.3)
SODIUM: 138 meq/L (ref 137–147)

## 2013-09-12 LAB — HEPATIC FUNCTION PANEL
ALBUMIN: 2 g/dL — AB (ref 3.5–5.2)
ALT: 21 U/L (ref 0–53)
AST: 28 U/L (ref 0–37)
Alkaline Phosphatase: 244 U/L — ABNORMAL HIGH (ref 39–117)
Bilirubin, Direct: 0.4 mg/dL — ABNORMAL HIGH (ref 0.0–0.3)
Indirect Bilirubin: 1 mg/dL — ABNORMAL HIGH (ref 0.3–0.9)
Total Bilirubin: 1.4 mg/dL — ABNORMAL HIGH (ref 0.3–1.2)
Total Protein: 5.8 g/dL — ABNORMAL LOW (ref 6.0–8.3)

## 2013-09-12 LAB — CBC
HCT: 28.7 % — ABNORMAL LOW (ref 39.0–52.0)
Hemoglobin: 9.4 g/dL — ABNORMAL LOW (ref 13.0–17.0)
MCH: 29.3 pg (ref 26.0–34.0)
MCHC: 32.8 g/dL (ref 30.0–36.0)
MCV: 89.4 fL (ref 78.0–100.0)
Platelets: 16 10*3/uL — CL (ref 150–400)
RBC: 3.21 MIL/uL — ABNORMAL LOW (ref 4.22–5.81)
RDW: 16.7 % — AB (ref 11.5–15.5)
WBC: 4.5 10*3/uL (ref 4.0–10.5)

## 2013-09-12 LAB — GAMMA GT: GGT: 106 U/L — AB (ref 7–51)

## 2013-09-12 MED ORDER — MORPHINE SULFATE 4 MG/ML IJ SOLN
4.0000 mg | INTRAMUSCULAR | Status: DC | PRN
  Administered 2013-09-12 – 2013-09-13 (×4): 4 mg via INTRAVENOUS
  Filled 2013-09-12 (×5): qty 1

## 2013-09-12 MED ORDER — IOHEXOL 350 MG/ML SOLN
100.0000 mL | Freq: Once | INTRAVENOUS | Status: AC | PRN
  Administered 2013-09-12: 100 mL via INTRAVENOUS

## 2013-09-12 MED ORDER — CALCIUM CARBONATE-VITAMIN D 500-200 MG-UNIT PO TABS
1.0000 | ORAL_TABLET | Freq: Two times a day (BID) | ORAL | Status: DC
  Administered 2013-09-12: 1 via ORAL
  Filled 2013-09-12: qty 1

## 2013-09-12 MED ORDER — RIVAROXABAN 20 MG PO TABS: 20.0000 mg | ORAL_TABLET | Freq: Every day | ORAL | Status: DC

## 2013-09-12 MED ORDER — LIDOCAINE-PRILOCAINE 2.5-2.5 % EX CREA: 1.0000 "application " | TOPICAL_CREAM | Freq: Once | CUTANEOUS | Status: DC | PRN

## 2013-09-12 MED ORDER — DEXAMETHASONE 4 MG PO TABS: 8.0000 mg | ORAL_TABLET | ORAL | Status: DC

## 2013-09-12 MED ORDER — DEXTROSE 5 % IV SOLN
2.0000 g | Freq: Two times a day (BID) | INTRAVENOUS | Status: DC
  Administered 2013-09-12: 2 g via INTRAVENOUS
  Filled 2013-09-12 (×5): qty 2

## 2013-09-12 MED ORDER — MORPHINE SULFATE 2 MG/ML IJ SOLN
2.0000 mg | INTRAMUSCULAR | Status: DC | PRN
  Administered 2013-09-12: 2 mg via INTRAVENOUS
  Filled 2013-09-12: qty 1

## 2013-09-12 MED ORDER — ONDANSETRON HCL 4 MG PO TABS: 8.0000 mg | ORAL_TABLET | Freq: Three times a day (TID) | ORAL | Status: DC | PRN

## 2013-09-12 MED ORDER — BUDESONIDE-FORMOTEROL FUMARATE 160-4.5 MCG/ACT IN AERO
2.0000 | INHALATION_SPRAY | Freq: Two times a day (BID) | RESPIRATORY_TRACT | Status: DC
  Administered 2013-09-12 – 2013-09-13 (×2): 2 via RESPIRATORY_TRACT
  Filled 2013-09-12: qty 6

## 2013-09-12 MED ORDER — ALBUTEROL SULFATE (2.5 MG/3ML) 0.083% IN NEBU
2.5000 mg | INHALATION_SOLUTION | RESPIRATORY_TRACT | Status: DC | PRN
  Administered 2013-09-13: 2.5 mg via RESPIRATORY_TRACT
  Filled 2013-09-12: qty 3

## 2013-09-12 MED ORDER — PROCHLORPERAZINE MALEATE 5 MG PO TABS
10.0000 mg | ORAL_TABLET | Freq: Two times a day (BID) | ORAL | Status: DC | PRN
Start: 2013-09-12 — End: 2013-09-12

## 2013-09-12 MED ORDER — ENOXAPARIN SODIUM 100 MG/ML ~~LOC~~ SOLN: 1.0000 mg/kg | Freq: Two times a day (BID) | SUBCUTANEOUS | Status: DC

## 2013-09-12 MED ORDER — OXYCODONE-ACETAMINOPHEN 5-325 MG PO TABS
1.0000 | ORAL_TABLET | ORAL | Status: DC | PRN
  Administered 2013-09-12: 1 via ORAL
  Filled 2013-09-12: qty 1

## 2013-09-12 MED ORDER — ALBUTEROL SULFATE (2.5 MG/3ML) 0.083% IN NEBU
INHALATION_SOLUTION | RESPIRATORY_TRACT | Status: AC
Start: 2013-09-12 — End: 2013-09-12
  Administered 2013-09-12: 2.5 mg
  Filled 2013-09-12: qty 3

## 2013-09-12 MED ORDER — FOLIC ACID 1 MG PO TABS
1.0000 mg | ORAL_TABLET | Freq: Every day | ORAL | Status: DC
  Administered 2013-09-12: 1 mg via ORAL
  Filled 2013-09-12: qty 1

## 2013-09-12 MED ORDER — OXYCODONE HCL 5 MG PO TABS: 5.0000 mg | ORAL_TABLET | ORAL | Status: DC | PRN

## 2013-09-12 MED ORDER — IOHEXOL 300 MG/ML  SOLN: 80.0000 mL | Freq: Once | INTRAMUSCULAR | Status: DC | PRN

## 2013-09-12 MED ORDER — LORAZEPAM 2 MG/ML IJ SOLN
0.5000 mg | INTRAMUSCULAR | Status: DC | PRN
  Administered 2013-09-12 – 2013-09-13 (×3): 0.5 mg via INTRAVENOUS
  Filled 2013-09-12 (×3): qty 1

## 2013-09-12 NOTE — Progress Notes (Signed)
CRITICAL VALUE ALERT  Critical value received:  Platelets = 16  Date of notification:  09/12/13  Time of notification:  0730  Critical value read back:Yes.    Nurse who received alert: Russ Halo, RN  MD notified (1st page):  Dr.Ortiz  Time of first page:  [phone]

## 2013-09-12 NOTE — Progress Notes (Addendum)
BP 96/67  Pulse 94  Temp(Src) 98 F (36.7 C) (Oral)  Resp 20  Ht 6\' 2"  (1.88 m)  Wt 90.4 kg (199 lb 4.7 oz)  BMI 25.58 kg/m2  SpO2 93% - I talked with the patient he was breathing about 25-35 times per minute. Is uncomfortable and wife was at bedside. I explained that he has a CT scan of the chest showed no PE with a loculated right hydropneumothorax. Is currently on vancomycin Zosyn for health care associated pneumonia his platelets are 16 upper extremity Doppler showed bilateral DVT with extensive DVT on the left arm. Patient may require more oxygen. After explaining to the patient the poor prognosis. He decided to move towards comfort care. He would like antibiotics anticoagulation to be stopped and will like to be kept comfortable. The patient would also like a cigarette that he knows he cannot have one in the hospital.  - He agreed to move towards comfort care. We'll arrange for ambulance transportation tomorrow Morning will stop antibiotics and anticoagulation. Use morphine for pain and Ativan for agitation. I spoke with the patient more than 65 minutes and the family was at bedside and they agree with plan.  Start time was 3:30 pm -4:40 pm a direct contact with the patient.

## 2013-09-12 NOTE — Progress Notes (Signed)
ANTICOAGULATION CONSULT NOTE - Initial Consult  Pharmacy Consult for Lovenox Indication: VTE Treatment (Currently off Xarelto)  No Known Allergies  Patient Measurements: Height: 6\' 2"  (188 cm) Weight: 199 lb 4.7 oz (90.4 kg) IBW/kg (Calculated) : 82.2  Vital Signs: Temp: 98 F (36.7 C) (08/01 0627) Temp src: Oral (08/01 0627) BP: 96/67 mmHg (08/01 1534) Pulse Rate: 94 (08/01 1534)  Labs:  Recent Labs  09/10/13 1425 09/11/13 0504 09/12/13 0556  HGB 10.1* 8.7* 9.4*  HCT 30.6* 25.8* 28.7*  PLT 12* <30* 16*  APTT  --  31  --   LABPROT  --  15.5*  --   INR  --  1.23  --   CREATININE 2.17* 1.87* 1.38*    Estimated Creatinine Clearance: 58.7 ml/min (by C-G formula based on Cr of 1.38).   Medical History: Past Medical History  Diagnosis Date  . H/O renal calculi   . Urethral stricture   . Wears hearing aid     right ear  . Hearing impaired person, right   . Arthritis   . Macular degeneration   . Ulcerative colitis   . History of kidney stones   . Pulmonary embolus 07/10/2013  . DVT, lower extremity 07/10/2013    LLE  . COPD (chronic obstructive pulmonary disease)   . Prostate cancer 10/24/07    radiation therapy  . Non-small cell cancer of right lung 08/21/2013    diagnosed by cytology from thorocentesis  . Bone metastases 08/29/2013    Medications:  Scheduled:  . budesonide-formoterol  2 puff Inhalation BID  . calcium-vitamin D  1 tablet Oral BID  . ceFEPime (MAXIPIME) IV  2 g Intravenous Q12H  . folic acid  1 mg Oral Daily  . polyethylene glycol  17 g Oral BID  . senna  1 tablet Oral BID  . vancomycin  750 mg Intravenous Q12H   Prescriptions prior to admission  Medication Sig Dispense Refill  . albuterol (PROAIR HFA) 108 (90 BASE) MCG/ACT inhaler Inhale 2 puffs into the lungs every 4 (four) hours as needed for wheezing or shortness of breath.  1 Inhaler  11  . Bevacizumab (AVASTIN IV) Inject 1 each into the vein every 21 ( twenty-one) days. Last  administered 09/01/13.      . budesonide-formoterol (SYMBICORT) 160-4.5 MCG/ACT inhaler Inhale 2 puffs into the lungs 2 (two) times daily.  1 Inhaler  6  . calcium-vitamin D (OSCAL WITH D) 500-200 MG-UNIT per tablet Take 1 tablet by mouth 2 (two) times daily.      Marland Kitchen CARBOPLATIN IV Inject 1 each into the vein every 21 ( twenty-one) days. Last administered 09/01/2013.      Marland Kitchen Denosumab (XGEVA ) Inject 1 each into the skin every 28 (twenty-eight) days. Last administered 09/01/13.      Marland Kitchen dexamethasone (DECADRON) 4 MG tablet Take 8 mg by mouth See admin instructions. Takes 2 tablets twice daily ONLY on the day before, the day of, and the day after chemo.      . folic acid (FOLVITE) 1 MG tablet Take 1 tablet (1 mg total) by mouth daily. Start 5-7 days before Alimta chemotherapy. Continue until 21 days after Alimta completed.  100 tablet  3  . furosemide (LASIX) 40 MG tablet Take 1 tablet (40 mg total) by mouth daily.  60 tablet  2  . lidocaine-prilocaine (EMLA) cream Apply 1 application topically once as needed (Apply quarter-sized amount to port site 1 hour prior to chemo.).      Marland Kitchen  ondansetron (ZOFRAN) 8 MG tablet Take 8 mg by mouth every 8 (eight) hours as needed for nausea or vomiting.      Marland Kitchen oxyCODONE (OXY IR/ROXICODONE) 5 MG immediate release tablet Take 5 mg by mouth every 4 (four) hours as needed for moderate pain or severe pain.      Marland Kitchen PEMEtrexed (ALIMTA) 500 MG injection Inject into the vein every 21 ( twenty-one) days.      . prochlorperazine (COMPAZINE) 10 MG tablet Take 10 mg by mouth 2 (two) times daily as needed for nausea or vomiting.      . rivaroxaban (XARELTO) 20 MG TABS tablet Take 1 tablet (20 mg total) by mouth daily with supper.  30 tablet  3  Assessment: 68 yo male with hx DVT and Stage IV NSCLC to be started on Lovenox for VTE Treatment.  Previously on Xarelto.  Thrombocytopenia noted, s/p chemotherapy treatment carboplatin and pemetrexed.  Goal of Therapy:  Systemic  Anticoagulation.  Monitor platelets by anticoagulation protocol: Yes   Plan:  Lovenox 1mg /kg SQ every 12 hours. CBC daily.  Pricilla Larsson 09/12/2013,4:37 PM

## 2013-09-12 NOTE — Progress Notes (Addendum)
[facility] PROGRESS NOTE  Assessment/Plan: AKI (acute kidney injury): - Pre-renal. Due to decrease oral intake. - Started on NS, Cr improving. - Cont IV fluid, hold lasix.   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 - Afebrile. Sating > 90 on 4L. - ct chest order , I have explained this to the patient. He still wants to go home. He relates that it doesn't matter what the results show she will like to go home. - I explain risk and benefits of doing this and he seems to understand and he would like hospice to be sent home.   Constipation: - Enema and miralax .   Thrombocytopenia: - probably due to chemo. now improving - Resume Xarelto, no signs of bleeding. - left arm swelling check doppler of upper ext.  Hyperbilirubinemia: - Alkaline phosphatase elevated probably due to bone mets. Check GGT. - Billi trending down hepatic function panel pending.  Code Status: none Family Communication: wife Disposition Plan: inpatient   Consultants:  none  Procedures:  CT abd  Antibiotics:  vanc and cefepime   HPI/Subjective: SOB improve, wants to go home.  Objective: Filed Vitals:   09/10/13 2100 09/11/13 0420 09/11/13 1521 09/12/13 0627  BP: 92/63 99/68 102/68 108/58  Pulse: 91 77 77 81  Temp: 97.8 F (36.6 C) 97.8 F (36.6 C) 98.1 F (36.7 C) 98 F (36.7 C)  TempSrc: Oral Oral  Oral  Resp: 20 20  18   Height: 6\' 2"  (1.88 m)     Weight: 90.4 kg (199 lb 4.7 oz)     SpO2: 99% 95% 96% 91%    Intake/Output Summary (Last 24 hours) at 09/12/13 1113 Last data filed at 09/11/13 1841  Gross per 24 hour  Intake      0 ml  Output    700 ml  Net   -700 ml   Filed Weights   09/10/13 2100  Weight: 90.4 kg (199 lb 4.7 oz)    Exam:  General: Alert, awake, oriented x3, in no acute distress.  HEENT: No bruits, no goiter.  Heart: Regular rate and rhythm. Lungs: Good air movement,  crackles on the right Abdomen: Soft, nontender, nondistended, positive bowel sounds.    Data Reviewed: Basic Metabolic Panel:  Recent Labs Lab 09/10/13 1425 09/11/13 0504 09/12/13 0556  NA 135* 136* 138  K 5.3 4.7 4.4  CL 96 99 102  CO2 28 26 25   GLUCOSE 117* 120* 128*  BUN 54* 45* 37*  CREATININE 2.17* 1.87* 1.38*  CALCIUM 7.7* 6.8* 6.5*   Liver Function Tests:  Recent Labs Lab 09/10/13 1425 09/11/13 0504  AST 27 26  ALT 27 22  ALKPHOS 252* 219*  BILITOT 1.7* 1.3*  PROT 6.1 5.5*  ALBUMIN 2.1* 1.8*   No results found for this basename: LIPASE, AMYLASE,  in the last 168 hours No results found for this basename: AMMONIA,  in the last 168 hours CBC:  Recent Labs Lab 09/10/13 1425 09/11/13 0504 09/12/13 0556  WBC 3.8* 3.8* 4.5  NEUTROABS 3.0  --   --   HGB 10.1* 8.7* 9.4*  HCT 30.6* 25.8* 28.7*  MCV 86.9 87.2 89.4  PLT 12* <30* 16*   Cardiac Enzymes: No results found for this basename: CKTOTAL, CKMB, CKMBINDEX, TROPONINI,  in the last 168 hours BNP (last 3 results)  Recent Labs  07/10/13 1014 08/05/13 1856 08/16/13 1509  PROBNP 1349.0* 204.3* 230.8*   CBG:  No results found for this basename: GLUCAP,  in the last 168 hours  Recent Results (from the past 240 hour(s))  CULTURE, BLOOD (ROUTINE X 2)     Status: None   Collection Time    09/10/13  9:39 PM      Result Value Ref Range Status   Specimen Description BLOOD RIGHT ANTECUBITAL   Final   Special Requests BOTTLES DRAWN AEROBIC AND ANAEROBIC 6CC   Final   Culture NO GROWTH 2 DAYS   Final   Report Status PENDING   Incomplete  CULTURE, BLOOD (ROUTINE X 2)     Status: None   Collection Time    09/10/13  9:39 PM      Result Value Ref Range Status   Specimen Description BLOOD RIGHT ANTECUBITAL   Final   Special Requests BOTTLES DRAWN AEROBIC AND ANAEROBIC 6CC EACH   Final   Culture NO GROWTH 2 DAYS   Final   Report Status PENDING   Incomplete     Studies: Ct Abdomen Pelvis Wo  Contrast  09/10/2013   CLINICAL DATA:  Abdominal pain.  Metastatic cancer.  EXAM: CT ABDOMEN AND PELVIS WITHOUT CONTRAST  TECHNIQUE: Multidetector CT imaging of the abdomen and pelvis was performed following the standard protocol without IV contrast.  COMPARISON:  CT scan dated 08/07/2013  FINDINGS: The patient has a new large loculated right hydropneumothorax or empyema. Tumor at the right base has markedly progressed extending from the inferior aspect of the right hilum. There is a small metastatic nodule at the left lung base, 7 mm, stable. Heart size is normal.  Extensive metastases throughout the liver are slightly progressed.  Spleen, pancreas, kidneys, and adrenal glands are stable. There is chronic cholelithiasis with chronic thickening of the gallbladder wall. No dilated bile ducts. Periarticular adenopathy is stable. No acute abnormality of the bowel. Slight diverticulosis of the proximal sigmoid portion of the colon. No free air or free fluid in the abdomen.  Extensive blastic osseous metastases are noted, consistent with prostate cancer. There are severe degenerative changes of the discs at L3-4 and L4-5 with a disc protrusion and gas within the spinal canal at L3-4.  IMPRESSION: 1. Progressive metastatic disease in the liver. 2. New right empyema or loculated hydropneumothorax with marked progression of tumor in consolidation at the right lung base. 3. No other significant change.   Electronically Signed   By: Rozetta Nunnery M.D.   On: 09/10/2013 18:09   Portable Chest 1 View  09/11/2013   CLINICAL DATA:  Pneumonia and pleural effusion. History of lung cancer with bone metastases, prostate cancer, P, and port placement.  EXAM: PORTABLE CHEST - 1 VIEW  COMPARISON:  09/10/2013  FINDINGS: Heart size and pulmonary vascularity appear normal. Power port type left central venous catheter is unchanged in position since previous study. Persistent cavitary lesion versus loculated pneumothorax again demonstrated  in the right costophrenic angle with associated atelectasis or consolidation. Linear atelectasis or fibrosis in the left lung base. No significant change since prior study.  IMPRESSION: No change since previous study.   Electronically Signed   By: Lucienne Capers M.D.   On: 09/11/2013 05:35   Dg Chest Portable 1 View  09/10/2013   CLINICAL DATA:  Swelling in lower extremity. Shortness of breath increasing since early July. History of prostate and right lung cancer. COPD. Port-A-Cath.  EXAM: PORTABLE CHEST - 1 VIEW  COMPARISON:  Chest x-ray on 09/10/2013, chest CT on 08/16/2013  FINDINGS: Heart is enlarged. There  is right lung base opacity. There has been development of cavitary appearance of the lateral right lung base, warranting further evaluation CT of the chest. Patient has a left-sided Port-A-Cath with tip overlying the level of the superior vena cava. Left lung is essentially clear.  IMPRESSION: 1. Interval development of cavitary appearance of the lateral right lung base. 2. Further evaluation a CT of the chest with contrast is recommended.   Electronically Signed   By: Shon Hale M.D.   On: 09/10/2013 18:05    Scheduled Meds: . ceFEPime (MAXIPIME) IV  2 g Intravenous Q12H  . polyethylene glycol  17 g Oral BID  . senna  1 tablet Oral BID  . vancomycin  750 mg Intravenous Q12H   Continuous Infusions: . sodium chloride 50 mL/hr at 09/11/13 2224     Charlynne Cousins  [facility] Pager [phone]. If 8PM-8AM, please contact night-coverage at [email or web address], password [facility] 09/12/2013, 11:13 AM  LOS: 2 days      **Disclaimer: This note may have been dictated with voice recognition software. Similar sounding words can inadvertently be transcribed and this note may contain transcription errors which may not have been corrected upon publication of note.**

## 2013-09-13 DIAGNOSIS — J449 Chronic obstructive pulmonary disease, unspecified: Secondary | ICD-10-CM

## 2013-09-13 DIAGNOSIS — E43 Unspecified severe protein-calorie malnutrition: Secondary | ICD-10-CM

## 2013-09-13 DIAGNOSIS — J90 Pleural effusion, not elsewhere classified: Secondary | ICD-10-CM

## 2013-09-13 DIAGNOSIS — E871 Hypo-osmolality and hyponatremia: Secondary | ICD-10-CM

## 2013-09-13 MED ORDER — MORPHINE SULFATE (CONCENTRATE) 10 MG /0.5 ML PO SOLN
20.0000 mg | ORAL | Status: DC | PRN
  Administered 2013-09-13: 20 mg via ORAL
  Filled 2013-09-13: qty 1

## 2013-09-13 MED ORDER — LORAZEPAM 1 MG PO TABS: 1.0000 mg | ORAL_TABLET | Freq: Three times a day (TID) | ORAL | Status: AC

## 2013-09-13 MED ORDER — ONDANSETRON 4 MG PO TBDP: 4.0000 mg | ORAL_TABLET | Freq: Three times a day (TID) | ORAL | Status: AC | PRN

## 2013-09-13 MED ORDER — MORPHINE SULFATE 4 MG/ML IJ SOLN: 8.0000 mg | INTRAMUSCULAR | Status: DC | PRN

## 2013-09-13 MED ORDER — MORPHINE SULFATE (CONCENTRATE) 10 MG /0.5 ML PO SOLN: 20.0000 mg | ORAL | Status: AC | PRN

## 2013-09-13 MED ORDER — MORPHINE SULFATE 4 MG/ML IJ SOLN: 4.0000 mg | INTRAMUSCULAR | Status: DC | PRN

## 2013-09-13 NOTE — Progress Notes (Signed)
PT placed on NRB mask as he becomes very SOB with any exertion. PT is approaching end of life most likely.  Will treat to keep as comfortable as possible.

## 2013-09-13 NOTE — Progress Notes (Signed)
Called into patient's room.  He was confused and trying to get out of bed. O2 sat was in the 80's and BP 68/29. Made MD aware. Told to keep the patient comfortable and monitor.  Respiratory called.  Put patient on a Non-rebreather.  Monitored patient for several minutes before leaving the room.  Patient calmed down and went to sleep.  Wife at bedside.

## 2013-09-13 NOTE — Progress Notes (Signed)
Patient discharged home with hospice to follow.  Family has already spoken with Hospice themselves - arranged to have paperwork done today and equipment switched tomorrow.  Patients IV removed - WNL. Reviewed medications with patients wife which she has already filled. EMS called for transport - awaiting arrival.  Wife verbalizes understanding of DC instructions.  Daughter is present at patients home awaiting arrival as well.Marland Kitchen

## 2013-09-13 NOTE — Discharge Summary (Addendum)
Physician Discharge Summary  Jonathan Goodwin PRF:[id number] DOB: [birth year] DOA: 09/10/2013  PCP: Jonathan Faster, FNP  Admit date: 09/10/2013 Discharge date: 10/08/2013  Time spent: 40 minutes  Recommendations for Outpatient Follow-up:  1. Home with hospice.  Discharge Diagnoses:  Principal Problem:   Acute respiratory failure with hypoxia Active Problems:   Stage IV NSCLC right lung   Dehydration   AKI (acute kidney injury)   Thrombocytopenia, unspecified   Hyponatremia   Abdominal pain   Protein-calorie malnutrition, severe   Acute pulmonary embolism   Loculated pleural effusion   DVT of upper extremity (deep vein thrombosis)   Discharge Condition: poor  Diet recommendation: comfort feeds  Filed Weights   09/10/13 2100  Weight: 90.4 kg (199 lb 4.7 oz)    History of present illness:  68 y.o. 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 PMH is significant for COPD, Prostate Ca, DVT/PE and ulcerative colitis      Hospital Course:  Acute on chronic hypoxic respiratory failure multifactorial: - Due to HCAP, loculated hydropneumothorax, acute PE and NSCLS - started empirically on vanc and cefepime for HCAP with some improvement in his saturations. - Pt could not talk in complete sentences after 3rd day of hospital stay requiring 5-6 l of oxygen. CT angio showed acute PE. With loculated Hydropneumothorax. - doppler of upper ext showed extensive left DVT and and right moderate DVT. He had thrombocytopenia as describe below. - After explaining to the patient the poor prognosis. He decided to move towards comfort care. He would like antibiotics and  anticoagulation to be stopped and will like to be kept comfortable. The patient would also like a cigarette that he knows he cannot have one in the hospital.  - He want to go home. Will set him up with hospice. - I talked to the patient and and wife that we cannot set up  hospice over the weekend, he understood but still wanted to go home on 8.2 2015 (Sunday). His wife related date she could handle him at home as long as she had the prescription from working.  AKI (acute kidney injury)  - Started on NS, Cr improving to baseline. - held lasix.   Extensive Upper extremity DVT B/L and Acute PE: - He was off xarelto for a few weeks. - CT angio showed as below, doppler for extensive DVT on the right. - D/w him risk and benefit of anticoagulation with severe thrombocytopenia.  Constipation:  - enema and miralax .  - resolved.  Thrombocytopenia:  - Probably due to chemo and infectious etiology. - PLt's 12, after 2 days of treatment of HCAP plt's started to increase.no signs of bleeding. Hold Xrelto for 24hrs, then resume.  Hyponatremia: - Due to AKI, improved with IV fluids. - held diuretics.  Severe protein caloric malnutrition: - ensure TID, comfort feeds.  Procedures:  CT angio  Ct abd and pelvis  ABd x-ray  Doppler upper ext.  Consultations:  none  Discharge Exam: Filed Vitals:   10/02/2013 0500  BP: 98/68  Pulse: 101  Temp:   Resp:     General: A&O x3 Cardiovascular: RR Respiratory: mod air movement, crackles on the right, tachypeneic  Discharge Instructions You were cared for by a hospitalist during your hospital stay. If you have any questions about your discharge medications or the care you received while you were in the hospital after you are discharged, you can call the unit and asked to  speak with the hospitalist on call if the hospitalist that took care of you is not available. Once you are discharged, your primary care physician will handle any further medical issues. Please note that NO REFILLS for any discharge medications will be authorized once you are discharged, as it is imperative that you return to your primary care physician (or establish a relationship with a primary care physician if you do not have one) for your  aftercare needs so that they can reassess your need for medications and monitor your lab values.      Discharge Instructions   Diet - low sodium heart healthy    Complete by:  As directed      Increase activity slowly    Complete by:  As directed             Medication List    STOP taking these medications       albuterol 108 (90 BASE) MCG/ACT inhaler  Commonly known as:  PROAIR HFA     AVASTIN IV     calcium-vitamin D 500-200 MG-UNIT per tablet  Commonly known as:  OSCAL WITH D     CARBOPLATIN IV     dexamethasone 4 MG tablet  Commonly known as:  DECADRON     folic acid 1 MG tablet  Commonly known as:  FOLVITE     furosemide 40 MG tablet  Commonly known as:  LASIX     lidocaine-prilocaine cream  Commonly known as:  EMLA     ondansetron 8 MG tablet  Commonly known as:  ZOFRAN     PEMEtrexed 500 MG injection  Commonly known as:  ALIMTA     prochlorperazine 10 MG tablet  Commonly known as:  COMPAZINE     rivaroxaban 20 MG Tabs tablet  Commonly known as:  XARELTO     XGEVA [place]      TAKE these medications       budesonide-formoterol 160-4.5 MCG/ACT inhaler  Commonly known as:  SYMBICORT  Inhale 2 puffs into the lungs 2 (two) times daily.     LORazepam 1 MG tablet  Commonly known as:  ATIVAN  Take 1 tablet (1 mg total) by mouth every 8 (eight) hours.     morphine CONCENTRATE 10 mg / 0.5 ml concentrated solution  Take 1 mL (20 mg total) by mouth every 2 (two) hours as needed for severe pain.     ondansetron 4 MG disintegrating tablet  Commonly known as:  ZOFRAN ODT  Take 1 tablet (4 mg total) by mouth every 8 (eight) hours as needed for nausea or vomiting.     oxyCODONE 5 MG immediate release tablet  Commonly known as:  Oxy IR/ROXICODONE  Take 5 mg by mouth every 4 (four) hours as needed for moderate pain or severe pain.       No Known Allergies Follow-up Information   Follow up with [facility].   Contact information:    [address] [place] [address] [phone]        The results of significant diagnostics from this hospitalization (including imaging, microbiology, ancillary and laboratory) are listed below for reference.    Significant Diagnostic Studies: Ct Abdomen Pelvis Wo Contrast  09/10/2013   CLINICAL DATA:  Abdominal pain.  Metastatic cancer.  EXAM: CT ABDOMEN AND PELVIS WITHOUT CONTRAST  TECHNIQUE: Multidetector CT imaging of the abdomen and pelvis was performed following the standard protocol without IV contrast.  COMPARISON:  CT scan dated  08/07/2013  FINDINGS: The patient has a new large loculated right hydropneumothorax or empyema. Tumor at the right base has markedly progressed extending from the inferior aspect of the right hilum. There is a small metastatic nodule at the left lung base, 7 mm, stable. Heart size is normal.  Extensive metastases throughout the liver are slightly progressed.  Spleen, pancreas, kidneys, and adrenal glands are stable. There is chronic cholelithiasis with chronic thickening of the gallbladder wall. No dilated bile ducts. Periarticular adenopathy is stable. No acute abnormality of the bowel. Slight diverticulosis of the proximal sigmoid portion of the colon. No free air or free fluid in the abdomen.  Extensive blastic osseous metastases are noted, consistent with prostate cancer. There are severe degenerative changes of the discs at L3-4 and L4-5 with a disc protrusion and gas within the spinal canal at L3-4.  IMPRESSION: 1. Progressive metastatic disease in the liver. 2. New right empyema or loculated hydropneumothorax with marked progression of tumor in consolidation at the right lung base. 3. No other significant change.   Electronically Signed   By: Rozetta Nunnery M.D.   On: 09/10/2013 18:09   Dg Chest 1 View  08/25/2013   CLINICAL DATA:  Post thoracentesis.  EXAM: CHEST - 1 VIEW  COMPARISON:  08/24/2013.  FINDINGS: Decrease in size although incomplete  clearance of right-sided pleural effusion post thoracentesis. No pneumothorax detected.  Right lung base infiltrate, atelectasis or mass may be present in addition to right-sided pleural effusion.  Left central line tip proximal superior vena cava level.  Calcified aorta.  Heart size not adequately assessed.  IMPRESSION: No pneumothorax detected post right-sided thoracentesis.  Please see above.   Electronically Signed   By: Chauncey Cruel M.D.   On: 08/25/2013 11:50   Dg Chest 1 View  08/18/2013   CLINICAL DATA:  Post RIGHT thoracentesis  EXAM: CHEST - 1 VIEW  COMPARISON:  08/16/2013 ; correlation CT chest 08/16/2013  FINDINGS: Enlargement of cardiac silhouette.  Atherosclerotic calcification aorta.  Mediastinal contours and pulmonary vascularity normal.  Minimal LEFT basilar atelectasis.  Underlying emphysematous changes.  Decrease in RIGHT pleural effusion versus prior CT, post thoracentesis.  Persistent atelectasis and/or consolidation of the RIGHT middle and RIGHT lower lobes ; unable to exclude underlying abnormalities including mass with this appearance.  Tiny RIGHT apex pneumothorax new since previous exam.  No acute osseous findings.  IMPRESSION: Decrease in RIGHT pleural effusion post thoracentesis though RIGHT pleural effusion and atelectasis versus consolidation of the RIGHT middle and RIGHT lower lobes persists.  Tiny RIGHT apex pneumothorax post thoracentesis.  Underlying COPD with minimal LEFT basilar atelectasis.  Critical Value/emergent results were called by telephone at the time of interpretation on 08/18/2013 at 4:16 PM to Dr. Ree Kida, who verbally acknowledged these results.   Electronically Signed   By: Lavonia Dana M.D.   On: 08/18/2013 16:16   Dg Abd 1 View  09/12/2013   CLINICAL DATA:  Abdominal pain and constipation. Decreased appetite. History of prostate cancer.  EXAM: ABDOMEN - 1 VIEW  COMPARISON:  CT abdomen and pelvis 09/10/2013  FINDINGS: Residual oral contrast material is noted in the  left colon and rectum. No dilated loops of bowel are seen to suggest obstruction. Calcifications in the pelvis likely represent phleboliths. Fiducial markers are noted in the region of the prostate. Vascular calcifications are noted. Loculated right hydropneumothorax is noted. Lumbar levoscoliosis and lower lumbar spondylosis are noted.  IMPRESSION: No evidence of bowel obstruction. Loculated right hydropneumothorax.   Electronically  Signed   By: Logan Bores   On: 09/12/2013 13:10   Ct Head W Wo Contrast  08/17/2013   CLINICAL DATA:  Staging for metastatic disease.  Unknown primary.  EXAM: CT HEAD WITHOUT AND WITH CONTRAST  TECHNIQUE: Contiguous axial images were obtained from the base of the skull through the vertex without and with intravenous contrast  CONTRAST:  103mL OMNIPAQUE IOHEXOL 300 MG/ML  SOLN  COMPARISON:  None.  FINDINGS: No evidence for acute infarction, hemorrhage, mass lesion, hydrocephalus, or extra-axial fluid. Mild atrophy. Mild chronic microvascular ischemic change. Post infusion, no abnormal enhancement of the brain or meninges. Vascular calcification. No acute sinus or mastoid disease. No osseous lesions.  IMPRESSION: Chronic changes as described. No acute intracranial abnormality. No enhancing lesions to suggest metastatic disease.   Electronically Signed   By: Rolla Flatten M.D.   On: 08/17/2013 17:21   Ct Angio Chest Pe W/cm &/or Wo Cm  09/12/2013   CLINICAL DATA:  Shortness of breath.  History of lung cancer.  EXAM: CT ANGIOGRAPHY CHEST WITH CONTRAST  TECHNIQUE: Multidetector CT imaging of the chest was performed using the standard protocol during bolus administration of intravenous contrast. Multiplanar CT image reconstructions and MIPs were obtained to evaluate the vascular anatomy.  CONTRAST:  134mL OMNIPAQUE IOHEXOL 350 MG/ML SOLN  COMPARISON:  Portable chest obtained yesterday and chest CT dated 08/16/2013.  FINDINGS: Small filling defect in a right middle lobe pulmonary artery. No  other pulmonary arterial filling defects are seen.  Since the previous CT, there has been interval development of a large loculated right hydropneumothorax occupying approximately 60% of the right hemothorax. There is compressive atelectasis of the adjacent right lower lobe and right middle lobe.  There has been an interval decrease in size of multiple enlarged mediastinal and bilateral hilar lymph nodes. A previously demonstrated 1.6 cm short axis AP window node currently has a 1.2 cm short axis on image number 34. A previously demonstrated 2.4 cm short axis left hilar lymph node has a 2.0 cm short axis today on image number 51.  Bullous changes are again demonstrated in both lungs. Multiple liver metastases have not changed significantly. Bilateral upper pole renal cysts are noted. There has been an increase in the number of multiple sclerotic bone lesions.  Review of the MIP images confirms the above findings.  IMPRESSION: 1. Very small right middle lobe pulmonary embolus. 2. Interval approximately 60% right loculated hydropneumothorax, possibly due to an interval bronchopleural fistula. 3. Improved metastatic mediastinal and bilateral hilar adenopathy. 4. Progressive bony metastatic disease. 5. Grossly stable hepatic metastatic disease. Critical Value/emergent results were called by telephone at the time of interpretation on 09/12/2013 at 3:26 pm to [facility], the patient's nurse, who verbally acknowledged these results.   Electronically Signed   By: Enrique Sack M.D.   On: 09/12/2013 15:30   Ct Angio Chest Pe W/cm &/or Wo Cm  08/16/2013   CLINICAL DATA:  Shortness of breath.  EXAM: CT ANGIOGRAPHY CHEST WITH CONTRAST  TECHNIQUE: Multidetector CT imaging of the chest was performed using the standard protocol during bolus administration of intravenous contrast. Multiplanar CT image reconstructions and MIPs were obtained to evaluate the vascular anatomy.  CONTRAST:  169mL OMNIPAQUE IOHEXOL 350 MG/ML SOLN  COMPARISON:   08/05/2013  FINDINGS: Small amount of residual eccentric chronic what again seen within the main right pulmonary artery and proximal right lower lobe pulmonary artery, not significantly changed. No new pulmonary embolus.  Increasing right pleural effusion, now moderate.  Continued volume loss and air bronchograms within the right middle lobe, stable. Increasing airspace disease in the right lower lobe. Moderate emphysematous changes. Minimal scattered opacities noted in the left lower lobe at the left lung base and in the lingula.  Small nodule in the lingula measures 5 mm on image 60, stable. Adenopathy in the mediastinum and hila again noted, unchanged. AP window lymph node has a short axis diameter of 16 mm on image 37. Left hilar lymph node has a short axis diameter of 14 mm on image 41. Left hilar lymph node on image 53 as a short axis diameter of 19 mm. Other enlarged AP window, left hilar and paratracheal lymph nodes are also stable.  Heart is borderline in size. Coronary artery calcifications. Aorta is normal caliber.  Imaging into the upper abdomen again demonstrates multiple low-density lesions throughout the liver compatible with metastases, likely not significantly changed. Sclerotic foci throughout the thoracic spine are stable, likely bone metastases.  Review of the MIP images confirms the above findings.  IMPRESSION: Stable chronic small pulmonary embolus in the right main and lower lobe pulmonary arteries. This is nonocclusive. No new pulmonary embolus.  Enlarging right pleural effusion with increasing right lower lobe consolidation. Stable volume loss and air bronchograms in the medial right middle lobe.  Stable mediastinal and left hilar adenopathy compatible with metastatic disease. Stable multiple ill-defined hepatic metastases. Stable sclerotic bone metastases.   Electronically Signed   By: Rolm Baptise M.D.   On: 08/16/2013 18:26   US Venous Img Upper Bilat  09/12/2013   CLINICAL DATA:  Upper  extremity swelling  EXAM: BILATERAL UPPER EXTREMITY VENOUS DOPPLER ULTRASOUND  TECHNIQUE: Gray-scale sonography with graded compression, as well as color Doppler and duplex ultrasound were performed to evaluate the bilateral upper extremity deep venous systems from the level of the subclavian vein and including the jugular, axillary, basilic, radial, ulnar and upper cephalic vein. Spectral Doppler was utilized to evaluate flow at rest and with distal augmentation maneuvers.  COMPARISON:  None.  FINDINGS: The right IJ vein is compressible and patent. Right axillary, basilic, and cephalic veins are patent. The right brachial, radial, and ulnar veins are noncompressible and thrombosed. Normal-appearing venous waveform in the right subclavian vein.  The left IJ vein, left subclavian vein, left axillary vein, left brachial vein, left radial vein, and left ulnar vein are noncompressible and all are thrombosed. Left cephalic and basilic veins are compressible and patent.  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 This occurs in the right brachial, radial, and ulnar veins. On the left, the left IJ vein is thrombosed. There is DVT in the left arm extending from the left subclavian vein, through the left axillary vein, left brachial vein, and left forearm deep veins.   Electronically Signed   By: Maryclare Bean M.D.   On: 09/12/2013 14:04   Portable Chest 1 View  09/11/2013   CLINICAL DATA:  Pneumonia and pleural effusion. History of lung cancer with bone metastases, prostate cancer, P, and port placement.  EXAM: PORTABLE CHEST - 1 VIEW  COMPARISON:  09/10/2013  FINDINGS: Heart size and pulmonary vascularity appear normal. Power port type left central venous catheter is unchanged in position since previous study. Persistent cavitary lesion versus loculated pneumothorax again demonstrated in the right costophrenic angle with associated atelectasis or consolidation. Linear atelectasis or fibrosis in the left lung base. No  significant change since prior study.  IMPRESSION: No change since previous study.   Electronically Signed   By:  Lucienne Capers M.D.   On: 09/11/2013 05:35   Dg Chest Portable 1 View  09/10/2013   CLINICAL DATA:  Swelling in lower extremity. Shortness of breath increasing since early July. History of prostate and right lung cancer. COPD. Port-A-Cath.  EXAM: PORTABLE CHEST - 1 VIEW  COMPARISON:  Chest x-ray on 09/10/2013, chest CT on 08/16/2013  FINDINGS: Heart is enlarged. There is right lung base opacity. There has been development of cavitary appearance of the lateral right lung base, warranting further evaluation CT of the chest. Patient has a left-sided Port-A-Cath with tip overlying the level of the superior vena cava. Left lung is essentially clear.  IMPRESSION: 1. Interval development of cavitary appearance of the lateral right lung base. 2. Further evaluation a CT of the chest with contrast is recommended.   Electronically Signed   By: Shon Hale M.D.   On: 09/10/2013 18:05   Dg Chest Port 1 View  08/24/2013   CLINICAL DATA:  Port-A-Cath placement.  EXAM: PORTABLE CHEST - 1 VIEW  COMPARISON:  08/18/2013  FINDINGS: Power injectable left subclavian Port-A-Cath terminates in the upper SVC near the brachiocephalic confluence. No pneumothorax observed.  Large right pleural effusion fills 2/3 of the right hemithorax. Interstitial accentuation in the right chest.  Platelike atelectasis at the left lung base. Right heart border obscured.  IMPRESSION: 1. Port-A-Cath tip: Upper SVC near the brachiocephalic confluence. No pneumothorax or complicating feature. 2. Large right pleural effusion with indistinct interstitial opacity in the remaining aerated portion of the right upper lobe. 3. Platelike atelectasis, left lung base.   Electronically Signed   By: Sherryl Barters M.D.   On: 08/24/2013 14:50   Dg Chest Port 1 View  08/16/2013   CLINICAL DATA:  SHORTNESS OF BREATH  EXAM: PORTABLE CHEST - 1 VIEW   COMPARISON:  Portable chest radiograph 08/05/2013  FINDINGS: Visualized cardiac silhouette stable. Persistent right lower lobe opacity with increased conspicuity silhouetting the right heart border. There is prominence of interstitial markings. Stable fullness in the left hilar region. No new focal regions of consolidation or new focal infiltrates. Osteoarthritic changes in the shoulders. No acute osseous abnormalities.  IMPRESSION: Stable pulmonary opacity right lower lobe. Differential considerations again include pneumonia, pulmonary hemorrhage, component of atelectasis.  Chest radiograph otherwise stable.   Electronically Signed   By: Margaree Mackintosh M.D.   On: 08/16/2013 15:35   Dg C-arm 1-60 Min-no Report  08/27/2013   : Fluoroscopy was utilized by the requesting physician. No radiographic interpretation.   Electronically Signed   By: Porfirio Mylar   On: 08/27/2013 17:02   US Thoracentesis Asp Pleural Space W/img Guide  08/25/2013   CLINICAL DATA:  Increased RIGHT pleural effusion  EXAM: US GUIDED RIGHT THORACENTESIS  TECHNIQUE: Procedure, benefits, and risks of procedure were discussed with patient.  Written informed consent for procedure was obtained.  Time out protocol followed.  Pleural effusion localized at the posterior RIGHT hemi thorax.  Skin prepped and draped in usual sterile fashion.  Skin and soft tissues anesthetized with 8 mL of 1% lidocaine.  8 French thoracentesis catheter placed into the RIGHT pleural space.  1500 mL of grossly bloody fluid aspirated by syringe pump.  Procedure tolerated well by patient without immediate complication.  COMPARISON:  08/18/2013  FINDINGS: As above  IMPRESSION: Ultrasound-guided RIGHT thoracentesis with removal of 1500 mL of grossly bloody fluid from the RIGHT hemi thorax.  No pneumothorax on post procedural chest radiograph, reported separately.   Electronically Signed  By: Lavonia Dana M.D.   On: 08/25/2013 13:48   US Thoracentesis Asp Pleural Space  W/img Guide  08/18/2013   CLINICAL DATA:  Shortness of breath, moderate-sized RIGHT pleural effusion  EXAM: US THORACENTESIS ASP PLEURAL SPACE W/IMG GUIDE  TECHNIQUE: Procedure, benefits, and risks of procedure were discussed with patient.  Written informed consent for procedure was obtained.  Time out protocol followed.  Pleural effusion localized at the posterior RIGHT hemi thorax.  Skin prepped and draped in usual sterile fashion.  Skin and soft tissues anesthetized with 7 mL of 1% lidocaine.  8 French thoracentesis catheter placed into the RIGHT pleural space.  800 mL of dark old appearing bloody fluid aspirated by syringe pump.  Procedure tolerated well by patient.  Tiny RIGHT apex pneumothorax on postprocedural chest radiograph.  COMPARISON:  Chest CT 08/16/2013  FINDINGS: As above  IMPRESSION: Ultrasound guided RIGHT thoracentesisyielding 800 mL of dark old bloody fluid as above.  180 mL of fluid sent to laboratory for requested analysis.  Tiny RIGHT apex pneumothorax on postprocedural chest radiograph, reported separately.   Electronically Signed   By: Lavonia Dana M.D.   On: 08/18/2013 17:01    Microbiology: Recent Results (from the past 240 hour(s))  CULTURE, BLOOD (ROUTINE X 2)     Status: None   Collection Time    09/10/13  9:39 PM      Result Value Ref Range Status   Specimen Description BLOOD RIGHT ANTECUBITAL   Final   Special Requests BOTTLES DRAWN AEROBIC AND ANAEROBIC 6CC   Final   Culture NO GROWTH 2 DAYS   Final   Report Status PENDING   Incomplete  CULTURE, BLOOD (ROUTINE X 2)     Status: None   Collection Time    09/10/13  9:39 PM      Result Value Ref Range Status   Specimen Description BLOOD RIGHT ANTECUBITAL   Final   Special Requests BOTTLES DRAWN AEROBIC AND ANAEROBIC [facility] EACH   Final   Culture  Setup Time     Final   Value: FEW GRAM POSITIVE RODS     Gram Stain Report Called to,Read Back By and Verified With: LAVINDER,J AT 2040 BY HUFFINES,S ON 09/12/13   Culture NO  GROWTH 2 DAYS   Final   Report Status PENDING   Incomplete     Labs: Basic Metabolic Panel:  Recent Labs Lab 09/10/13 1425 09/11/13 0504 09/12/13 0556  NA 135* 136* 138  K 5.3 4.7 4.4  CL 96 99 102  CO2 28 26 25   GLUCOSE 117* 120* 128*  BUN 54* 45* 37*  CREATININE 2.17* 1.87* 1.38*  CALCIUM 7.7* 6.8* 6.5*   Liver Function Tests:  Recent Labs Lab 09/10/13 1425 09/11/13 0504 09/12/13 0556  AST 27 26 28   ALT 27 22 21   ALKPHOS 252* 219* 244*  BILITOT 1.7* 1.3* 1.4*  PROT 6.1 5.5* 5.8*  ALBUMIN 2.1* 1.8* 2.0*   No results found for this basename: LIPASE, AMYLASE,  in the last 168 hours No results found for this basename: AMMONIA,  in the last 168 hours CBC:  Recent Labs Lab 09/10/13 1425 09/11/13 0504 09/12/13 0556  WBC 3.8* 3.8* 4.5  NEUTROABS 3.0  --   --   HGB 10.1* 8.7* 9.4*  HCT 30.6* 25.8* 28.7*  MCV 86.9 87.2 89.4  PLT 12* <30* 16*   Cardiac Enzymes: No results found for this basename: CKTOTAL, CKMB, CKMBINDEX, TROPONINI,  in the last 168 hours BNP: BNP (  last 3 results)  Recent Labs  07/10/13 1014 08/05/13 1856 08/16/13 1509  PROBNP 1349.0* 204.3* 230.8*   CBG: No results found for this basename: GLUCAP,  in the last 168 hours   Signed:  Charlynne Cousins  [facility] 10/08/2013, 8:18 AM

## 2013-09-14 LAB — LEGIONELLA ANTIGEN, URINE: LEGIONELLA ANTIGEN, URINE: NEGATIVE

## 2013-09-14 LAB — CULTURE, BLOOD (ROUTINE X 2)

## 2013-09-15 ENCOUNTER — Ambulatory Visit (HOSPITAL_COMMUNITY): Admission: RE | Admit: 2013-09-15 | Payer: Medicare Other | Source: Ambulatory Visit

## 2013-09-15 LAB — CULTURE, BLOOD (ROUTINE X 2): Culture: NO GROWTH

## 2013-09-18 ENCOUNTER — Ambulatory Visit: Payer: Medicare Other | Admitting: Internal Medicine

## 2013-09-22 ENCOUNTER — Inpatient Hospital Stay (HOSPITAL_COMMUNITY): Payer: Medicare Other

## 2013-09-22 ENCOUNTER — Ambulatory Visit (HOSPITAL_COMMUNITY): Payer: Medicare Other | Admitting: Oncology

## 2013-09-22 ENCOUNTER — Ambulatory Visit (HOSPITAL_COMMUNITY): Payer: Medicare Other

## 2013-09-29 ENCOUNTER — Ambulatory Visit (HOSPITAL_COMMUNITY): Payer: Medicare Other

## 2013-10-13 ENCOUNTER — Inpatient Hospital Stay (HOSPITAL_COMMUNITY): Payer: Medicare Other

## 2013-10-13 ENCOUNTER — Ambulatory Visit (HOSPITAL_COMMUNITY): Payer: Medicare Other

## 2013-10-13 DEATH — deceased

## 2013-10-27 ENCOUNTER — Ambulatory Visit (HOSPITAL_COMMUNITY): Payer: Medicare Other

## 2013-11-03 ENCOUNTER — Inpatient Hospital Stay (HOSPITAL_COMMUNITY): Payer: Medicare Other

## 2013-11-03 ENCOUNTER — Ambulatory Visit (HOSPITAL_COMMUNITY): Payer: Medicare Other | Admitting: Oncology

## 2014-02-25 ENCOUNTER — Encounter (HOSPITAL_COMMUNITY): Payer: Self-pay | Admitting: Urology

## 2015-01-28 IMAGING — CR DG CHEST 1V PORT
2 series · 2 of 2 positions shown · non-contrast
Comparison: 08/18/2013

CLINICAL DATA: Port-A-Cath placement.

EXAM:
PORTABLE CHEST - 1 VIEW

[portable (1 of 2)]
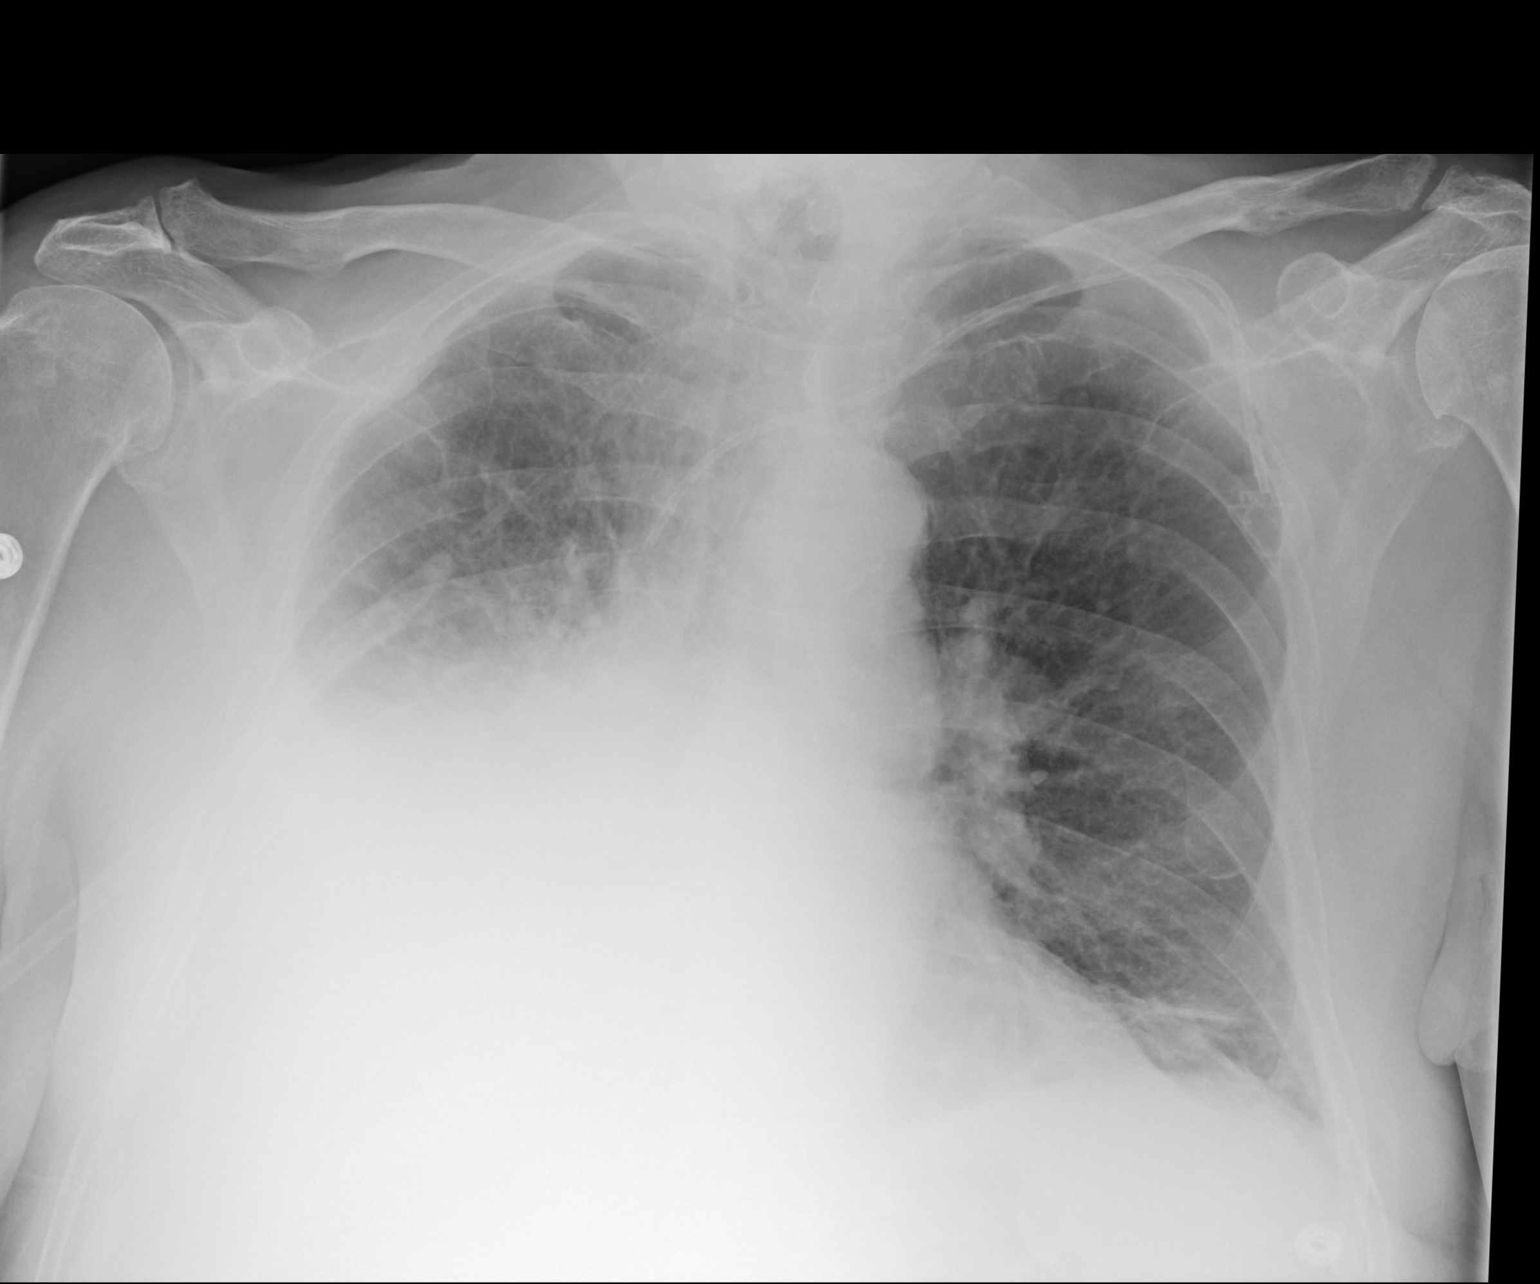

[portable (2 of 2)]
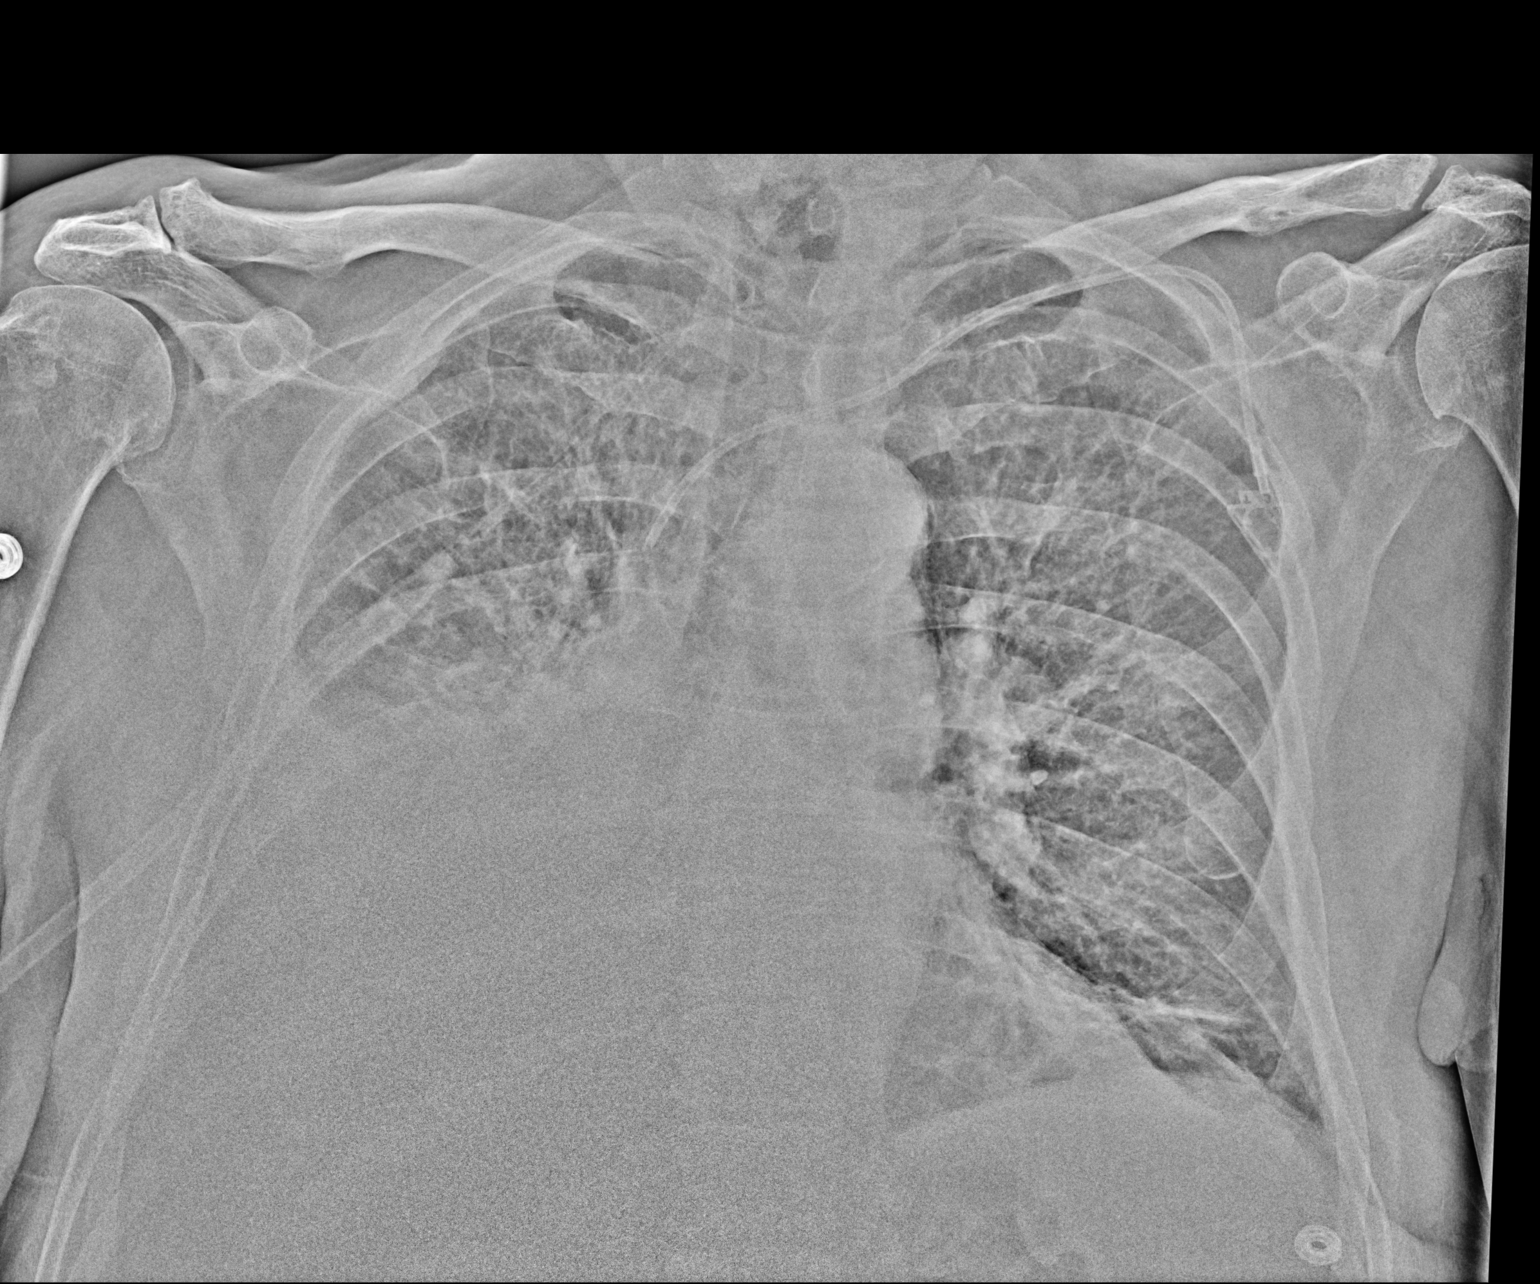

[2 of 2 positions shown; findings below may reference images not displayed]

FINDINGS: Power injectable left subclavian Port-A-Cath terminates in the upper
SVC near the brachiocephalic confluence. No pneumothorax observed.

Large right pleural effusion fills [DATE] of the right hemithorax.
Interstitial accentuation in the right chest.

Platelike atelectasis at the left lung base. Right heart border
obscured.
IMPRESSION: 1. Port-A-Cath tip: Upper SVC near the brachiocephalic confluence.
No pneumothorax or complicating feature.
2. Large right pleural effusion with indistinct interstitial opacity
in the remaining aerated portion of the right upper lobe.
3. Platelike atelectasis, left lung base.

## 2015-01-29 IMAGING — CR DG CHEST 1V
1 series · 1 of 1 positions shown · non-contrast
Comparison: 08/24/2013.

CLINICAL DATA: Post thoracentesis.

EXAM:
CHEST - 1 VIEW

[view not recorded]
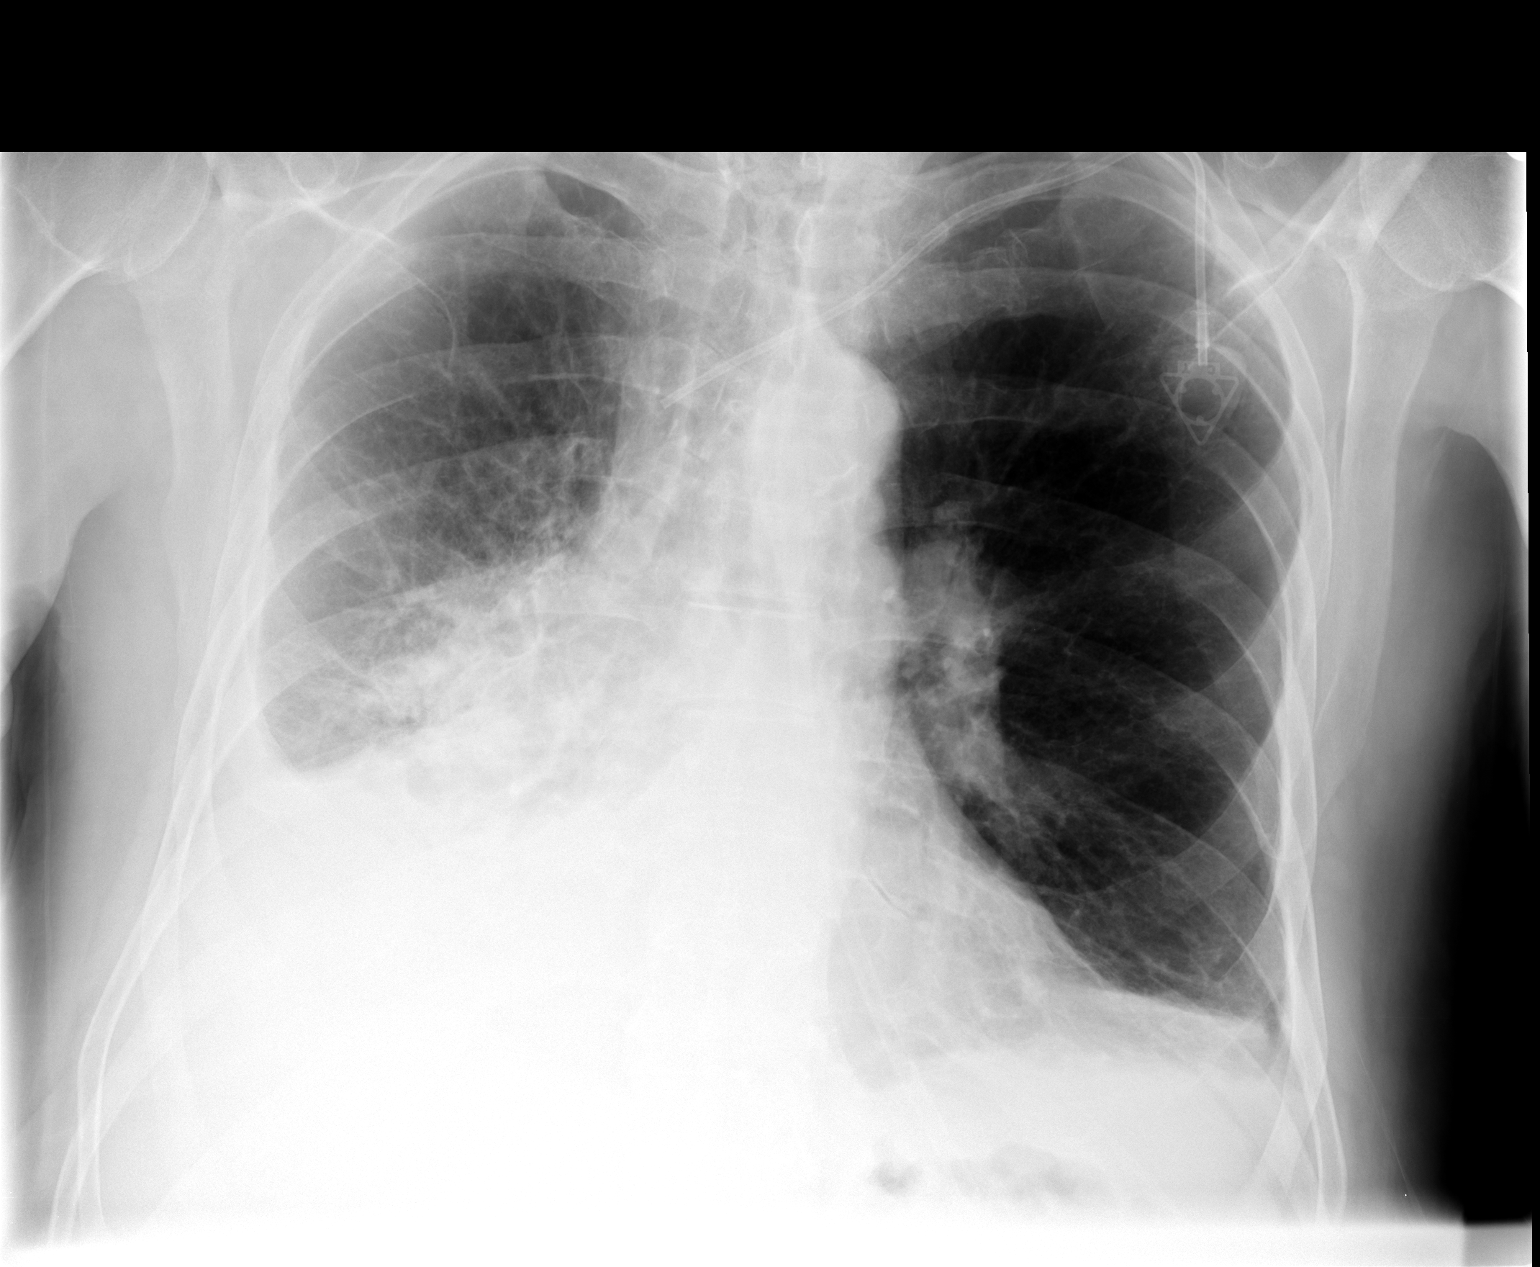

[1 of 1 positions shown; findings below may reference images not displayed]

FINDINGS: Decrease in size although incomplete clearance of right-sided
pleural effusion post thoracentesis. No pneumothorax detected.

Right lung base infiltrate, atelectasis or mass may be present in
addition to right-sided pleural effusion.

Left central line tip proximal superior vena cava level.

Calcified aorta.

Heart size not adequately assessed.
IMPRESSION: No pneumothorax detected post right-sided thoracentesis.

Please see above.

## 2015-02-14 IMAGING — CR DG CHEST 1V PORT
1 series · 1 of 1 positions shown · non-contrast
Comparison: Chest x-ray on 09/10/2013, chest CT on 08/16/2013

CLINICAL DATA: Swelling in lower extremity. Shortness of breath
increasing since early [REDACTED]. History of prostate and right lung
cancer. COPD. Port-A-Cath.

EXAM:
PORTABLE CHEST - 1 VIEW

[ap portable]
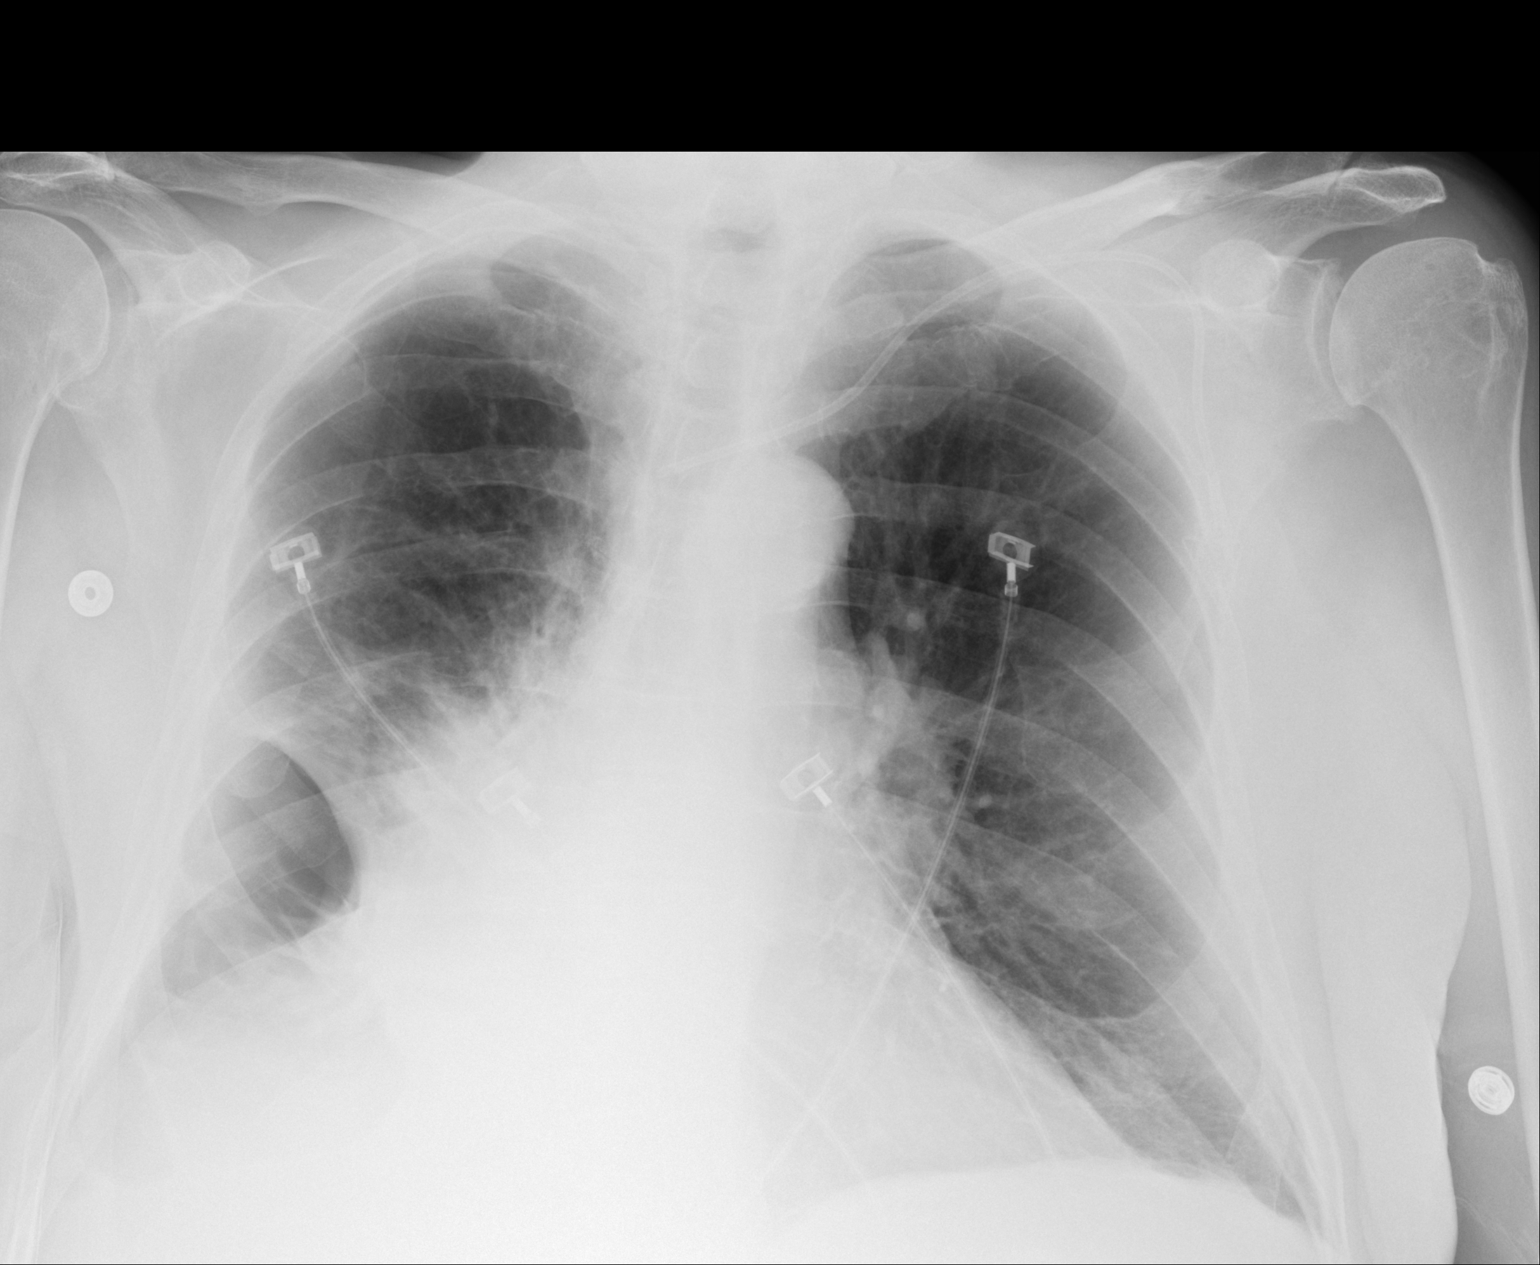

[1 of 1 positions shown; findings below may reference images not displayed]

FINDINGS: Heart is enlarged. There is right lung base opacity. There has been
development of cavitary appearance of the lateral right lung base,
warranting further evaluation CT of the chest. Patient has a
left-sided Port-A-Cath with tip overlying the level of the superior
vena cava. Left lung is essentially clear.
IMPRESSION: 1. Interval development of cavitary appearance of the lateral right
lung base.
2. Further evaluation a CT of the chest with contrast is
recommended.

## 2015-02-16 IMAGING — US US EXTREM  UP VENOUS BILAT
1 series · 13 of 24 positions shown · non-contrast
Comparison: None.

CLINICAL DATA: Upper extremity swelling

EXAM:
BILATERAL UPPER EXTREMITY VENOUS DOPPLER ULTRASOUND
TECHNIQUE: Gray-scale sonography with graded compression, as well as color
Doppler and duplex ultrasound were performed to evaluate the
bilateral upper extremity deep venous systems from the level of the
subclavian vein and including the jugular, axillary, basilic,
radial, ulnar and upper cephalic vein. Spectral Doppler was utilized
to evaluate flow at rest and with distal augmentation maneuvers.

[Series 1: us extrem up venous bilat · 0.06mm/px · 13 of 50 slices shown]
[im 1/50]
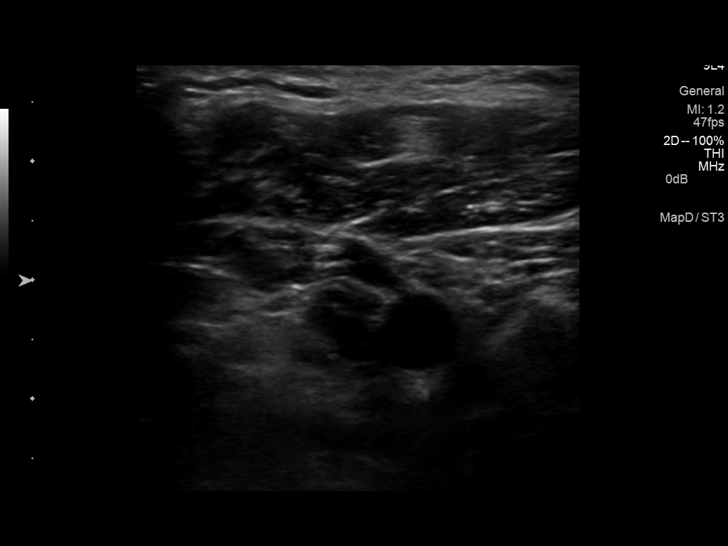
[im 5/50]
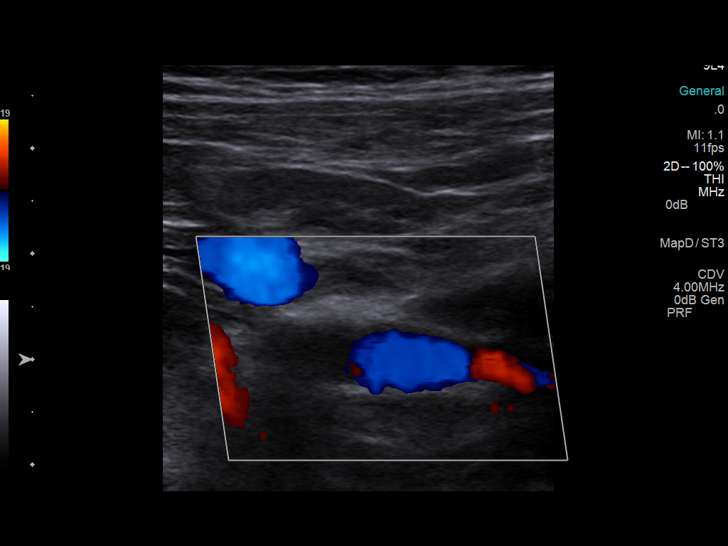
[im 9/50]
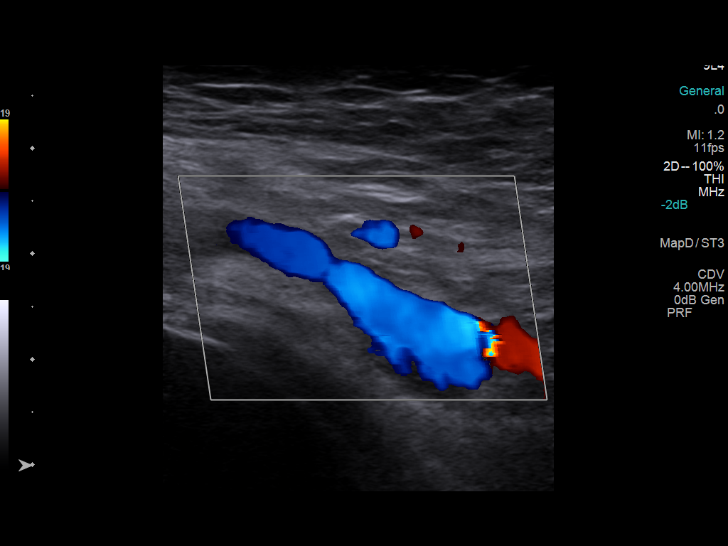
[im 13/50]
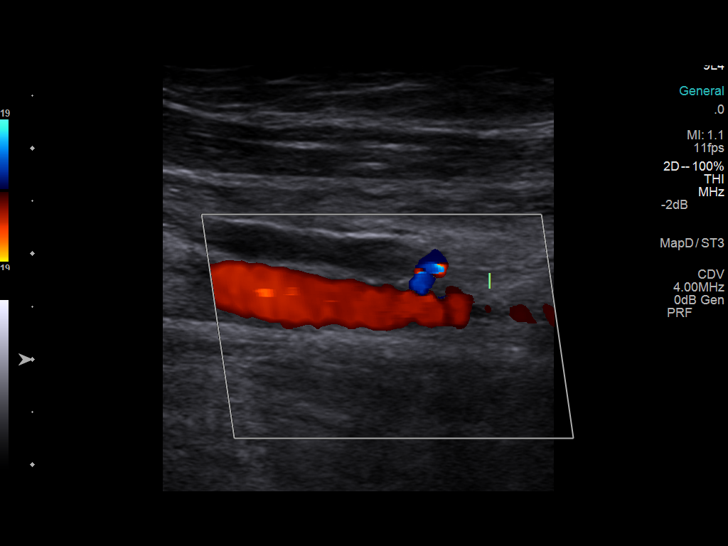
[im 18/50]
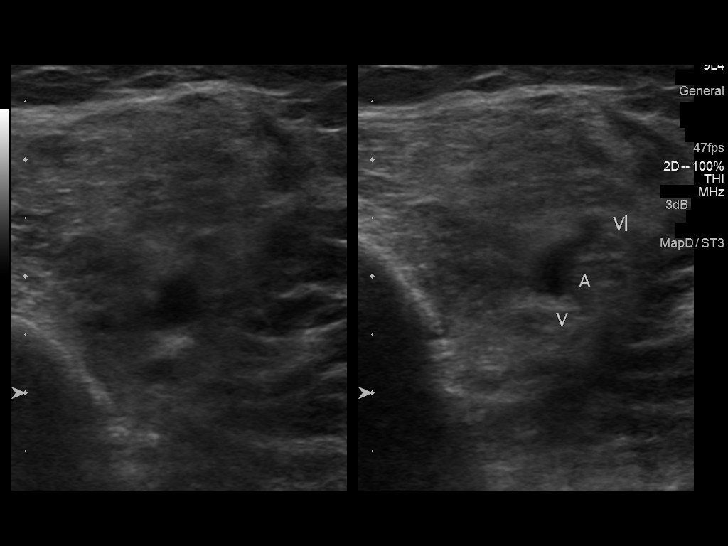
[im 22/50]
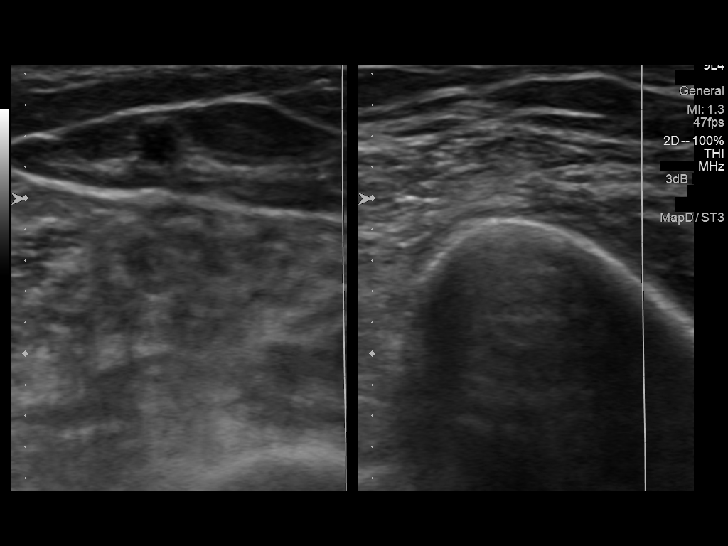
[im 26/50]
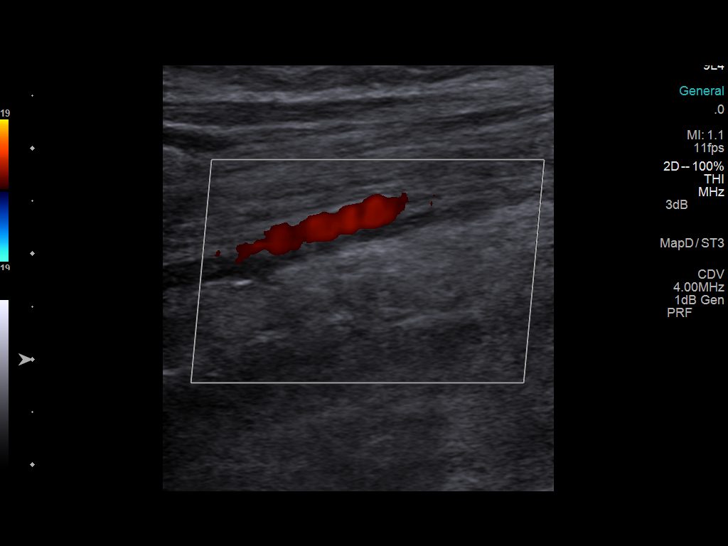
[im 28/50]
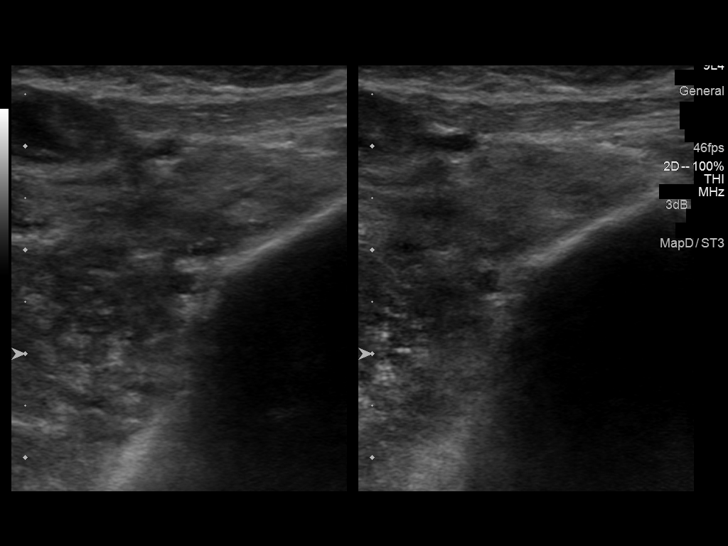
[im 32/50]
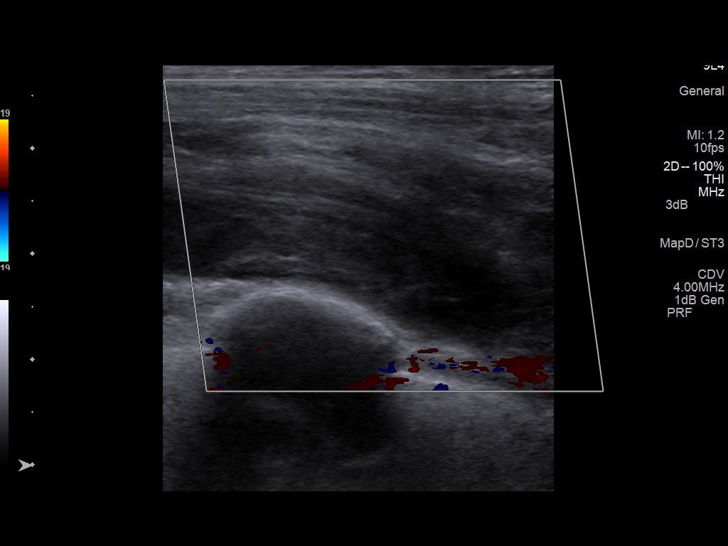
[im 37/50]
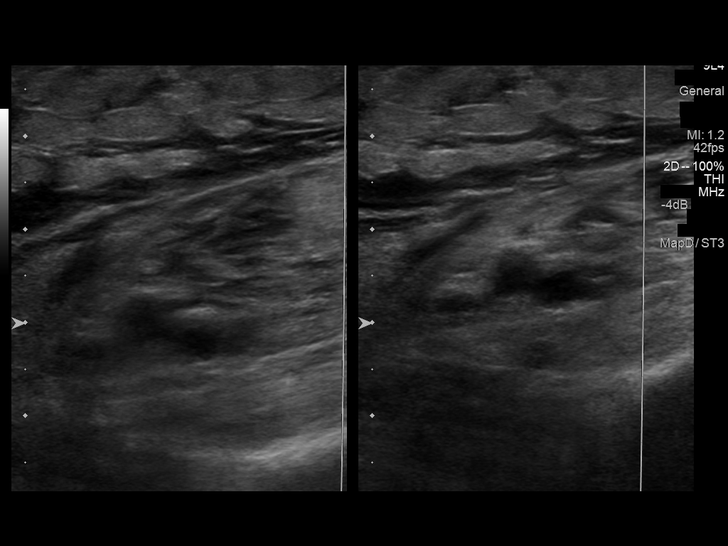
[im 41/50]
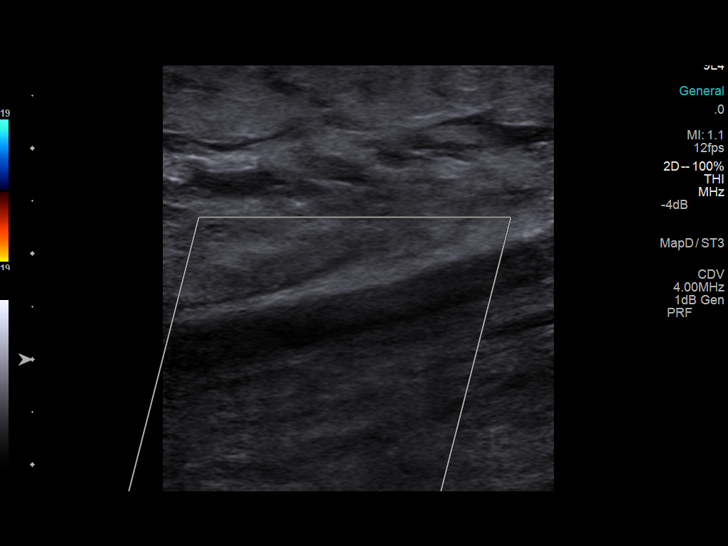
[im 45/50]
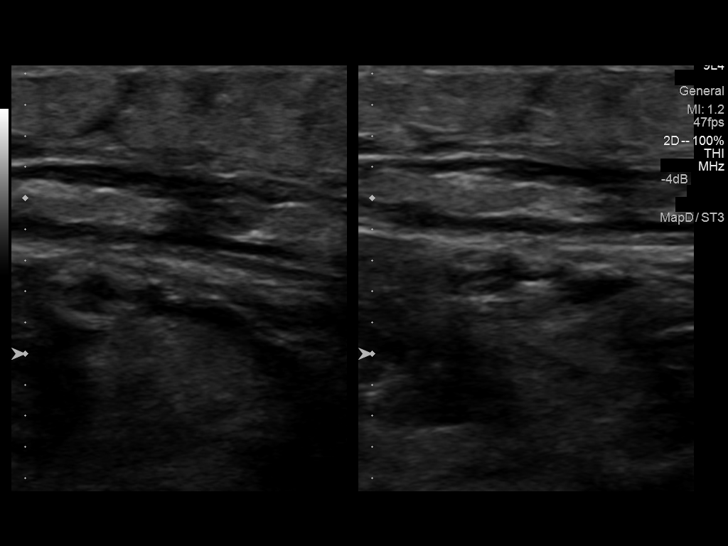
[im 50/50]
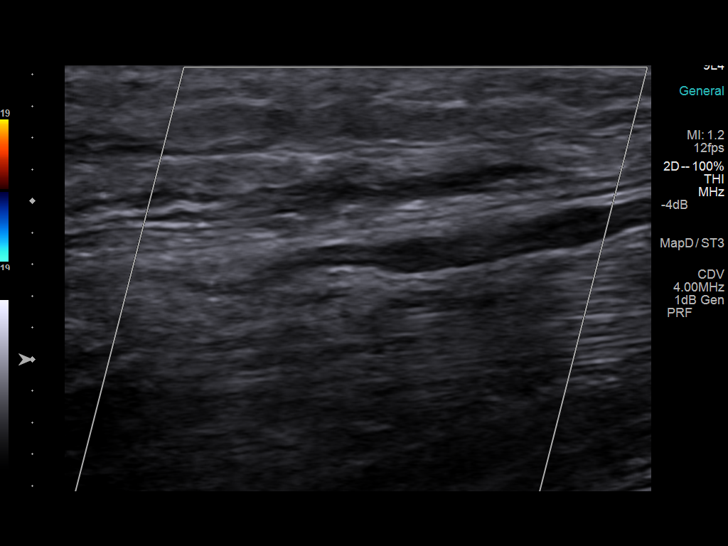

[13 of 24 positions shown; findings below may reference images not displayed]

FINDINGS: The right IJ vein is compressible and patent. Right axillary,
basilic, and cephalic veins are patent. The right brachial, radial,
and ulnar veins are noncompressible and thrombosed. Normal-appearing
venous waveform in the right subclavian vein.

The left IJ vein, left subclavian vein, left axillary vein, left
brachial vein, left radial vein, and left ulnar vein are
noncompressible and all are thrombosed. Left cephalic and basilic
veins are compressible and patent.
IMPRESSION: There is extensive bilateral DVT. This occurs in the right brachial,
radial, and ulnar veins. On the left, the left IJ vein is
thrombosed. There is DVT in the left arm extending from the left
subclavian vein, through the left axillary vein, left brachial vein,
and left forearm deep veins.

## 2015-02-16 IMAGING — CR DG ABDOMEN 1V
1 series · 2 of 2 positions shown · non-contrast
Comparison: CT abdomen and pelvis 09/10/2013

CLINICAL DATA: Abdominal pain and constipation. Decreased appetite.
History of prostate cancer.

EXAM:
ABDOMEN - 1 VIEW

[Series 1: ap portable · 0.17mm/px · 2 of 2 slices shown]
[im 1/2]
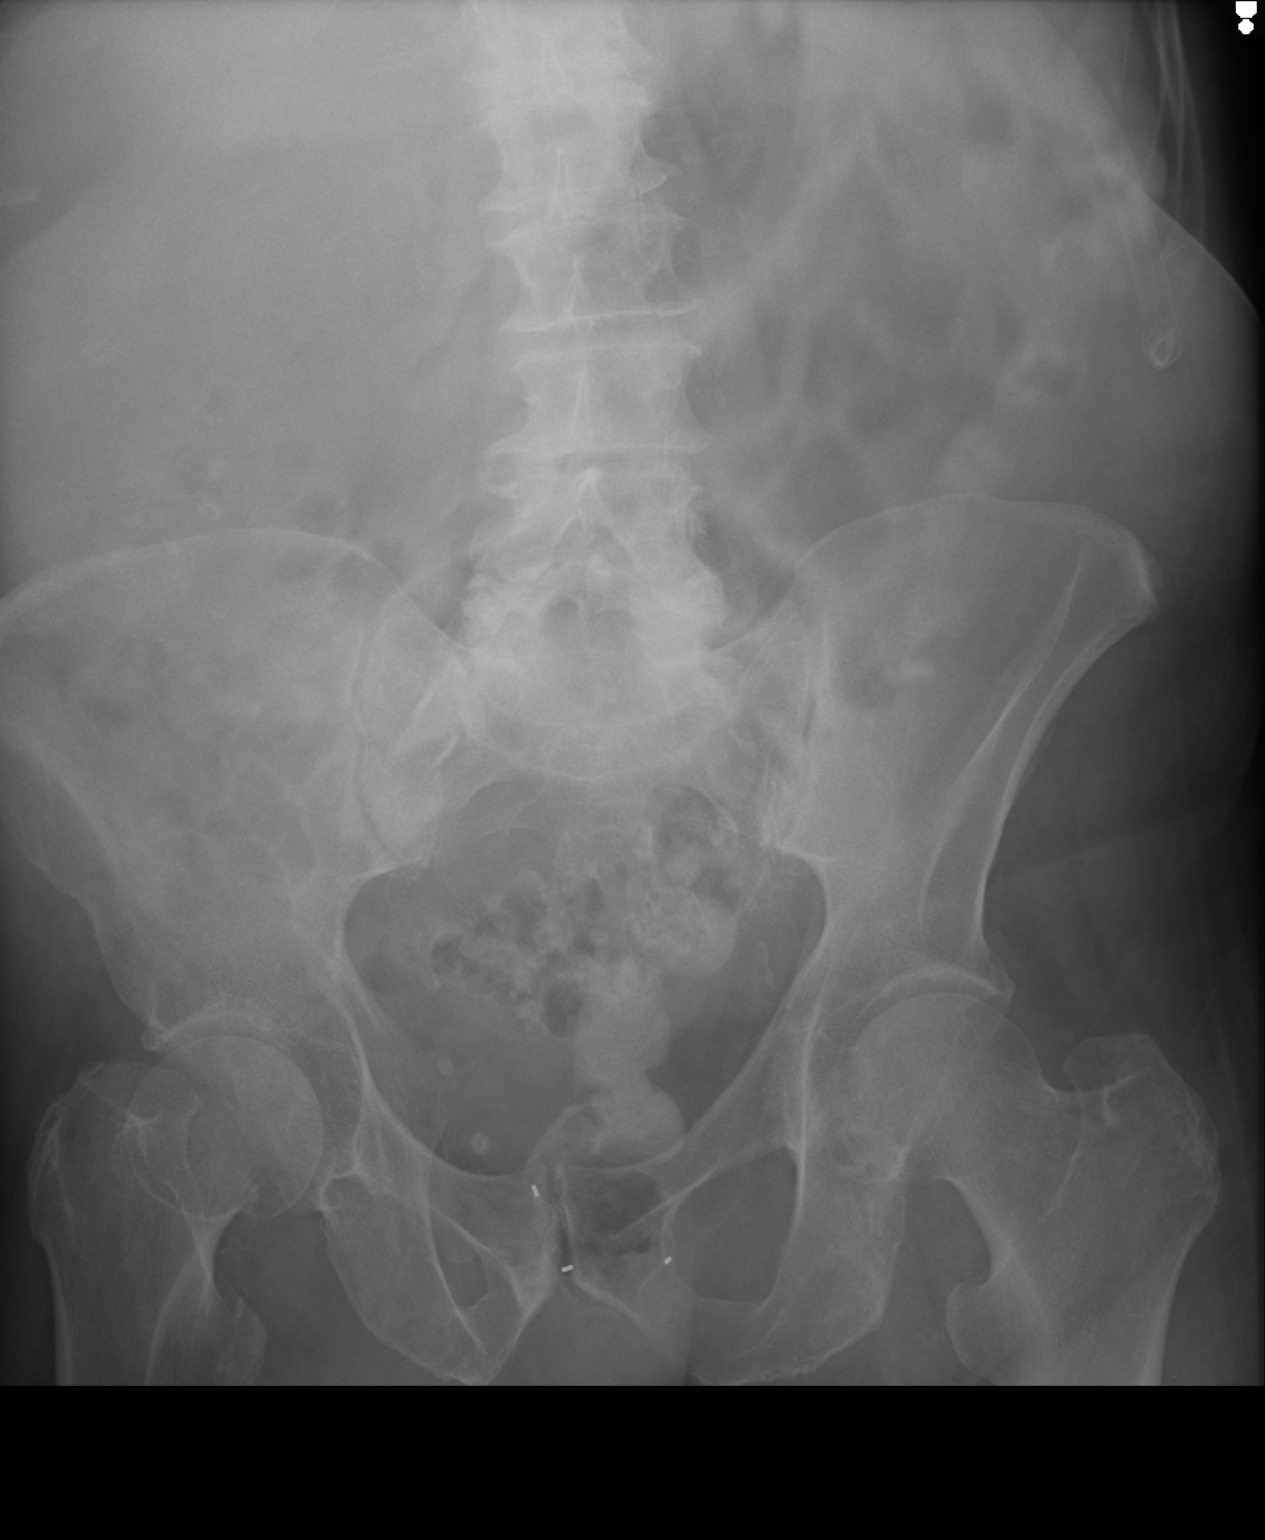
[im 2/2]
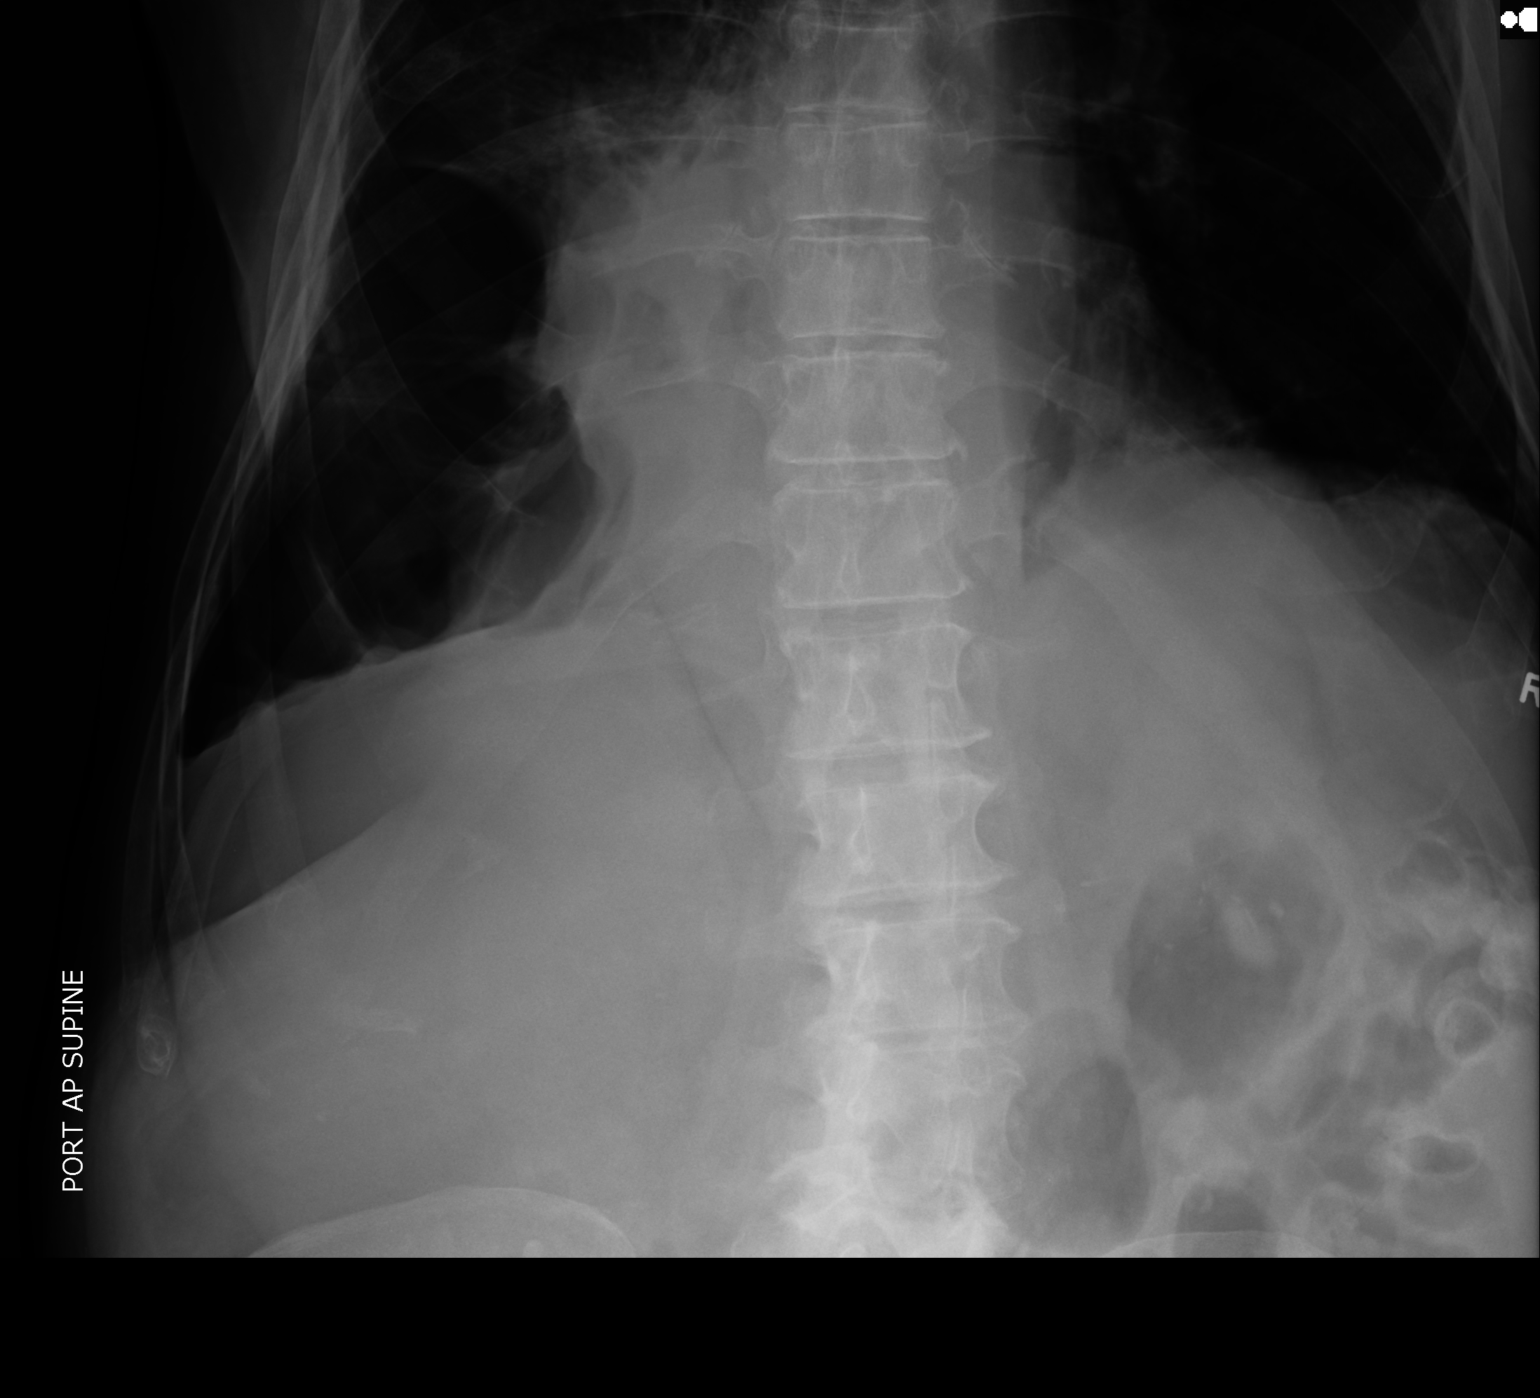

[2 of 2 positions shown; findings below may reference images not displayed]

FINDINGS: Residual oral contrast material is noted in the left colon and
rectum. No dilated loops of bowel are seen to suggest obstruction.
Calcifications in the pelvis likely represent phleboliths. Fiducial
markers are noted in the region of the prostate. Vascular
calcifications are noted. Loculated right hydropneumothorax is
noted. Lumbar levoscoliosis and lower lumbar spondylosis are noted.
IMPRESSION: No evidence of bowel obstruction. Loculated right hydropneumothorax.

## 2016-02-13 ENCOUNTER — Other Ambulatory Visit: Payer: Self-pay | Admitting: Nurse Practitioner
